# Patient Record
Sex: Female | Born: 1970
Health system: Southern US, Community
[De-identification: ages and names within clinical notes are randomized; demographics above are authoritative.]

## PROBLEM LIST (undated history)

## (undated) DIAGNOSIS — Z0000 Encounter for general adult medical examination without abnormal findings: Secondary | ICD-10-CM

## (undated) DIAGNOSIS — F329 Major depressive disorder, single episode, unspecified: Secondary | ICD-10-CM

## (undated) DIAGNOSIS — B354 Tinea corporis: Secondary | ICD-10-CM

## (undated) DIAGNOSIS — D649 Anemia, unspecified: Secondary | ICD-10-CM

## (undated) DIAGNOSIS — M542 Cervicalgia: Secondary | ICD-10-CM

## (undated) DIAGNOSIS — H6690 Otitis media, unspecified, unspecified ear: Secondary | ICD-10-CM

## (undated) DIAGNOSIS — E785 Hyperlipidemia, unspecified: Secondary | ICD-10-CM

## (undated) DIAGNOSIS — K5900 Constipation, unspecified: Secondary | ICD-10-CM

## (undated) DIAGNOSIS — G9050 Complex regional pain syndrome I, unspecified: Secondary | ICD-10-CM

## (undated) DIAGNOSIS — F17200 Nicotine dependence, unspecified, uncomplicated: Secondary | ICD-10-CM

## (undated) DIAGNOSIS — F32A Depression, unspecified: Secondary | ICD-10-CM

## (undated) DIAGNOSIS — N200 Calculus of kidney: Secondary | ICD-10-CM

## (undated) DIAGNOSIS — R350 Frequency of micturition: Secondary | ICD-10-CM

## (undated) DIAGNOSIS — K219 Gastro-esophageal reflux disease without esophagitis: Secondary | ICD-10-CM

## (undated) DIAGNOSIS — F418 Other specified anxiety disorders: Secondary | ICD-10-CM

## (undated) DIAGNOSIS — E669 Obesity, unspecified: Secondary | ICD-10-CM

## (undated) DIAGNOSIS — B019 Varicella without complication: Secondary | ICD-10-CM

## (undated) HISTORY — DX: Gastro-esophageal reflux disease without esophagitis: K21.9

## (undated) HISTORY — DX: Other specified anxiety disorders: F41.8

## (undated) HISTORY — DX: Obesity, unspecified: E66.9

## (undated) HISTORY — DX: Complex regional pain syndrome I, unspecified: G90.50

## (undated) HISTORY — DX: Constipation, unspecified: K59.00

## (undated) HISTORY — PX: WISDOM TOOTH EXTRACTION: SHX21

## (undated) HISTORY — DX: Tinea corporis: B35.4

## (undated) HISTORY — DX: Otitis media, unspecified, unspecified ear: H66.90

## (undated) HISTORY — DX: Encounter for general adult medical examination without abnormal findings: Z00.00

## (undated) HISTORY — DX: Major depressive disorder, single episode, unspecified: F32.9

## (undated) HISTORY — DX: Cervicalgia: M54.2

## (undated) HISTORY — DX: Varicella without complication: B01.9

## (undated) HISTORY — DX: Nicotine dependence, unspecified, uncomplicated: F17.200

## (undated) HISTORY — DX: Frequency of micturition: R35.0

## (undated) HISTORY — DX: Calculus of kidney: N20.0

## (undated) HISTORY — DX: Hyperlipidemia, unspecified: E78.5

## (undated) HISTORY — DX: Anemia, unspecified: D64.9

## (undated) HISTORY — DX: Depression, unspecified: F32.A

---

## 1998-10-16 LAB — HM PAP SMEAR

## 2003-10-17 HISTORY — PX: OTHER SURGICAL HISTORY: SHX169

## 2007-04-29 ENCOUNTER — Emergency Department (HOSPITAL_COMMUNITY): Admission: EM | Admit: 2007-04-29 | Discharge: 2007-04-30 | Payer: Self-pay | Admitting: Emergency Medicine

## 2011-07-31 LAB — POCT CARDIAC MARKERS
CKMB, poc: 1.4
CKMB, poc: 1.6
Troponin i, poc: <0.05

## 2011-07-31 LAB — BASIC METABOLIC PANEL
BUN: 14
Calcium: 8.6
GFR calc non Af Amer: >60
Potassium: 3.8

## 2011-07-31 LAB — LIPASE, BLOOD: Lipase: 28

## 2013-01-15 ENCOUNTER — Encounter: Payer: Self-pay | Admitting: Physical Medicine & Rehabilitation

## 2013-01-17 ENCOUNTER — Encounter: Payer: Self-pay | Admitting: Physical Medicine & Rehabilitation

## 2013-02-07 ENCOUNTER — Encounter: Payer: Self-pay | Admitting: Physical Medicine & Rehabilitation

## 2013-02-07 ENCOUNTER — Ambulatory Visit (HOSPITAL_BASED_OUTPATIENT_CLINIC_OR_DEPARTMENT_OTHER): Payer: Worker's Compensation | Admitting: Physical Medicine & Rehabilitation

## 2013-02-07 ENCOUNTER — Encounter: Payer: Worker's Compensation | Attending: Physical Medicine & Rehabilitation

## 2013-02-07 VITALS — BP 143/73 | HR 85 | Resp 14 | Ht 67.0 in | Wt 271.0 lb

## 2013-02-07 DIAGNOSIS — G90522 Complex regional pain syndrome I of left lower limb: Secondary | ICD-10-CM

## 2013-02-07 DIAGNOSIS — G90529 Complex regional pain syndrome I of unspecified lower limb: Secondary | ICD-10-CM | POA: Diagnosis not present

## 2013-02-07 NOTE — Patient Instructions (Addendum)
You will get a copy of the IME if you sign the records release

## 2013-02-07 NOTE — Progress Notes (Signed)
Subjective:    Patient ID: Beverly Erickson, female    DOB: Oct 29, 1970, 42 y.o.   MRN: [id number] DOI 06/07/2011 Answering call light for pt, tripped on cord and fell. HPI Records reviewed from [facility] regional physicians. Walk-in medical clinic 06/07/2011. Diagnosis of left knee contusion left ankle sprain. Was given crutches knee and ankle immobilizer ibuprofen and Lortab. 06/10/2011 physician followup visit complained of pain in left foot pain medicines were not helping. Continue Lortab Referral to [facility] orthopedic and sports medicine [facility] initial visit 07/05/2011. Diagnosis was early RSD of the left foot. X-rays of the foot and ankle were performed which showed no fracture or. Head some problems moving her toes on that date. There was foot swelling. Physical therapy was prescribed. MRI of the left ankle ordered by orthopedist 07/06/2011. No evidence of stress fracture there was plantar fasciitis strain the flexor hallucis longus Physical medicine rehabilitation consultation 08/02/2011 complaints of 7/10 pain pins and needles stabbing burning and freezing sensation. No back exam abnormalities. Left lower extremity complex regional pain syndrome was diagnosed. Cortisone tablets tried, not helpfu; Left lumbar sympathetic nerve block performed 08/08/2011. No followup appointment notes included. First nerve block seem to help Second nerve block complained of numbness in the left leg afterwards as well as other symptoms related to menstrual cycles and bowel constipation. Has been on Norco since 2012.  Lidoderm patch. Tried aqua therapy and physical therapy.Tried some local injections around the left ankle. These were not helpful and it felt like it caused swelling. Tried acupuncture and some type of laser treatment as well. Has another prescription for therapy this is pending approval from workers comp. Tens unit helps some Her for household distances. Uses wheelchair for longer  distances. Lyrica causes depression  Have not tried gabapentin or neurontin  Pain Inventory Average Pain 4 Pain Right Now 4 My pain is sharp, burning, dull, tingling and aching  In the last 24 hours, has pain interfered with the following? General activity 8 Relation with others 9 Enjoyment of life 9 What TIME of day is your pain at its worst? daytime evening and night Sleep (in general) Fair  Pain is worse with: walking, bending and standing Pain improves with: rest, therapy/exercise, medication and TENS Relief from Meds: 7  Mobility use a cane use a walker ability to climb steps?  yes do you drive?  yes  Function not employed: date last employed 06/07/11 I need assistance with the following:  bathing, meal prep, household duties and shopping  Neuro/Psych numbness trouble walking dizziness depression  Prior Studies Any changes since last visit?  no  Physicians involved in your care Any changes since last visit?  no   History reviewed. No pertinent family history. History   Social History  . Marital Status: Unknown    Spouse Name: N/A    Number of Children: N/A  . Years of Education: N/A   Social History Main Topics  . Smoking status: None  . Smokeless tobacco: None  . Alcohol Use: None  . Drug Use: None  . Sexually Active: None   Other Topics Concern  . None   Social History Narrative  . None   Past Surgical History  Procedure Laterality Date  . Cesarean section    . Plate and screws in left arm     Past Medical History  Diagnosis Date  . GERD (gastroesophageal reflux disease)    BP 143/73  Pulse 85  Resp 14  Ht 5\' 7"  (1.702 m)  Wt  271 lb (122.925 kg)  BMI 42.43 kg/m2  SpO2 98%    Review of Systems  Constitutional: Positive for unexpected weight change.  Gastrointestinal: Positive for abdominal pain and constipation.  Musculoskeletal: Positive for gait problem.  Neurological: Positive for dizziness and numbness.   Psychiatric/Behavioral: Positive for dysphoric mood.  All other systems reviewed and are negative.       Objective:   Physical Exam  Nursing note and vitals reviewed. Constitutional: She is oriented to person, place, and time. She appears well-developed.  obese  HENT:  Head: Normocephalic and atraumatic.  Eyes: Conjunctivae and EOM are normal. Pupils are equal, round, and reactive to light.  Neck: Normal range of motion.  Musculoskeletal:       Right ankle: Normal.       Left ankle: She exhibits decreased range of motion. Tenderness.       Right foot: Normal.       Left foot: She exhibits tenderness and swelling.  Neurological: She is alert and oriented to person, place, and time. A sensory deficit is present. Gait abnormal.  Temperature measured at the base of the great toe on the left 26F on the right 73F Able to identify light touch on both feet Hypersensitivity to touch on the dorsum of the foot as well as the big toe, plantar surface of the foot is less sensitive. There is edema of the dorsum of the foot and ankle area up to the midcalf. Right foot has normal sensitivity and normal appearance  Psychiatric: Her speech is normal. Her mood appears anxious.    Normal strength in the right Lower ext at the hip flexors knee extensors ankle dorsiflexor plantar flexor Normal strength at the left hip flexor  And knee extensor reduced strength at the ankle dorsiflexor plantar flexor and toe flexors and extensors which are at 3 minus with pain and range of motion limitation related to swelling.      Assessment & Plan:  1. Complex regional pain syndrome left lower extremity. She meets diagnostic criteria with hypersensitivity, swelling, erythema, skin temperature changes. She has had an adequate trial of physical therapy and can be transitioned to a community-based Aquatic exercise program From a medication standpoint she may benefit from a trial of gabapentin or a topical cream that  may contain a nonsteroidal anti-inflammatory, local anesthetic, and neuropathic pain medicine. Given her adverse reaction from previous sympathetic nerve block would not recommend repeat. Spinal cord stimulation is an accepted treatment however based on her prior history she may have difficulty tolerating an invasive procedure. I anticipate that she will have residual disability. That any improvements at this point will be minor. Adaptive shoe can be tried to see if it is tolerated.   As I discussed with the patient and her mother this visit does not establish a doctor-patient relationship. Any of these recommendations will need to be discussed with her physician.  Case manager Mariea Clonts came in during the discussion phase at the request of workers comp, patient was ok with this as well.  Claudette Laws MD FAAPM&R DABPM FIPP

## 2013-02-18 ENCOUNTER — Ambulatory Visit: Payer: Self-pay | Admitting: Physical Medicine & Rehabilitation

## 2013-11-12 ENCOUNTER — Ambulatory Visit: Payer: Worker's Compensation | Admitting: Physician Assistant

## 2013-11-14 ENCOUNTER — Ambulatory Visit (INDEPENDENT_AMBULATORY_CARE_PROVIDER_SITE_OTHER): Payer: BC Managed Care – PPO | Admitting: Physician Assistant

## 2013-11-14 ENCOUNTER — Encounter: Payer: Self-pay | Admitting: Physician Assistant

## 2013-11-14 VITALS — BP 132/88 | HR 88 | Temp 98.4°F | Resp 16 | Ht 67.0 in | Wt 289.0 lb

## 2013-11-14 DIAGNOSIS — G9050 Complex regional pain syndrome I, unspecified: Secondary | ICD-10-CM

## 2013-11-14 DIAGNOSIS — G90529 Complex regional pain syndrome I of unspecified lower limb: Secondary | ICD-10-CM

## 2013-11-14 DIAGNOSIS — IMO0002 Reserved for concepts with insufficient information to code with codable children: Secondary | ICD-10-CM

## 2013-11-14 MED ORDER — AMITRIPTYLINE HCL 10 MG PO TABS: 10.0000 mg | ORAL_TABLET | Freq: Every day | ORAL | Status: DC

## 2013-11-14 MED ORDER — HYDROCODONE-ACETAMINOPHEN 7.5-325 MG PO TABS: 1.0000 | ORAL_TABLET | Freq: Two times a day (BID) | ORAL | Status: DC | PRN

## 2013-11-14 NOTE — Progress Notes (Signed)
Patient presents to clinic today for medication management of Complex Regional Pain Syndrome of the left lower extremity.  Patient has had the diagnosis following an injury to the left foot and ankle she sustained in 2012.  Patient has been evaluated by multiple physicians for this chronic condition.  Patient has pain, hyperesthesias, allodynia, swelling and temperature change on extremities.  Denies change in symptoms from baseline.  Denies numbness of lower extremities.  Denies hx of DVT.  Denies hx of venous insufficiency. Patient has been taking Hydrocodone-APAP for her symptoms.  Has been prescribed from another provider.  After checking the [employer], patient has not had Rx filled in several months.  Patient also states she was tried on both Lyrica and gabapentin in the past with little/no relief of symptoms. Patient denies ever being on TCA for symptoms relief.  Does endorse physical therapy, occupational therapy and hydrotherapy in the past. Patient will be establishing care with Dr. Charlett Blake in the near future.  Past Medical History  Diagnosis Date  . GERD (gastroesophageal reflux disease)   . Anemia   . Chicken pox   . Depression     Current Outpatient Prescriptions on File Prior to Visit  Medication Sig Dispense Refill  . lidocaine (LIDODERM) 5 % Place 1 patch onto the skin daily. Remove & Discard patch within 12 hours or as directed by MD      . Ranitidine HCl (ZANTAC 75 PO) Take 75-150 mg by mouth daily.        No current facility-administered medications on file prior to visit.    Allergies  Allergen Reactions  . Codeine Nausea Only    Family History  Problem Relation Age of Onset  . Arthritis Mother     Living  . Hyperlipidemia Father   . Hypertension Mother   . Hypertension Father   . Thrombocytopenia Mother   . Thrombocytopenia Maternal Grandmother   . Alcohol abuse Maternal Grandfather   . Alcohol abuse Paternal Grandfather   . Cancer Paternal Grandfather   .  Heart disease Maternal Grandfather   . Stroke Maternal Grandmother   . Hypertension Maternal Grandmother   . Diabetes Daughter   . Hypothyroidism Daughter   . Parkinson's disease Maternal Grandfather   . Dementia Paternal Grandmother   . Healthy Brother     x1  . Healthy Son     x1  . Healthy Daughter     x1    History   Social History  . Marital Status: Widowed    Spouse Name: N/A    Number of Children: N/A  . Years of Education: N/A   Social History Main Topics  . Smoking status: Current Every Day Smoker    Types: Cigarettes  . Smokeless tobacco: None  . Alcohol Use: None  . Drug Use: None  . Sexual Activity: None   Other Topics Concern  . None   Social History Narrative  . None   Review of Systems - See HPI.  All other ROS are negative.  Filed Vitals:   11/14/13 0838  BP: 132/88  Pulse: 88  Temp: 98.4 F (36.9 C)  Resp: 16    Physical Exam  Constitutional: She is oriented to person, place, and time.  Cardiovascular: Normal rate, regular rhythm and normal heart sounds.   Pulmonary/Chest: Effort normal and breath sounds normal. No respiratory distress. She has no wheezes. She has no rales. She exhibits no tenderness.  Musculoskeletal:  ROM of left ankle and foot is intact  but exacerbates pain.  Pulses are 2+ equal bilaterally.  Foot is cool to touch, but with normal color.  Good capillary refill noted.  Neurological: She is alert and oriented to person, place, and time. No cranial nerve deficit.  Hyperesthesias and allodynia noted with sensory exam on left ankle and dorsal surface of foot.  Patient is able to discriminate, sharp/dull, soft touch on the plantar surface of her left foot.     No results found for this or any previous visit (from the past 2160 hour(s)).  Assessment/Plan: Complex regional pain syndrome I of lower limb Will refill Hydrocodone-APAP.  Will start with 5 mg Elavil QHS, increasing to 10 mg QHS after 1 week.  Will follow-up in 1  month with myself or to establish with PCP. Patient to consider repeating therapy.   I spent 25 minutes with the patient for interview, exam and treatment.

## 2013-11-14 NOTE — Assessment & Plan Note (Addendum)
Will refill Hydrocodone-APAP.  Will start with 5 mg Elavil QHS, increasing to 10 mg QHS after 1 week.  Will follow-up in 1 month with myself or to establish with PCP. Patient to consider repeating therapy.

## 2013-11-14 NOTE — Patient Instructions (Signed)
Please take medications as prescribed.  Elevate leg while resting.  Start Elavil (amitriptyline) 1/2 tablet at bedtime for 3-4 days.  Then increase to 1 tablet daily.  Please return in 3-4 weeks to see Dr. Charlett Blake for a follow-up.  If she is unavailable, please come see me.  Also, please fill out your portion of the handicap form and return to the [facility]

## 2013-11-17 ENCOUNTER — Telehealth: Payer: Self-pay | Admitting: Family Medicine

## 2013-11-17 NOTE — Telephone Encounter (Signed)
Opened in error

## 2013-11-19 ENCOUNTER — Telehealth: Payer: Self-pay | Admitting: Family Medicine

## 2013-11-19 NOTE — Telephone Encounter (Signed)
Relevant patient education assigned to patient using Emmi. ° °

## 2013-12-03 ENCOUNTER — Ambulatory Visit: Payer: Self-pay | Admitting: Physician Assistant

## 2013-12-04 ENCOUNTER — Ambulatory Visit (INDEPENDENT_AMBULATORY_CARE_PROVIDER_SITE_OTHER): Payer: BC Managed Care – PPO | Admitting: Physician Assistant

## 2013-12-04 ENCOUNTER — Encounter: Payer: Self-pay | Admitting: Physician Assistant

## 2013-12-04 VITALS — BP 124/82 | HR 84 | Temp 97.8°F | Resp 18 | Ht 67.0 in | Wt 290.5 lb

## 2013-12-04 DIAGNOSIS — K219 Gastro-esophageal reflux disease without esophagitis: Secondary | ICD-10-CM

## 2013-12-04 DIAGNOSIS — G9050 Complex regional pain syndrome I, unspecified: Secondary | ICD-10-CM

## 2013-12-04 DIAGNOSIS — R1013 Epigastric pain: Secondary | ICD-10-CM

## 2013-12-04 DIAGNOSIS — G90529 Complex regional pain syndrome I of unspecified lower limb: Secondary | ICD-10-CM

## 2013-12-04 LAB — CBC WITH DIFFERENTIAL/PLATELET
BASOS ABS: 0 10*3/uL (ref 0.0–0.1)
BASOS PCT: 0 % (ref 0–1)
EOS ABS: 0.1 10*3/uL (ref 0.0–0.7)
EOS PCT: 2 % (ref 0–5)
HEMATOCRIT: 42.4 % (ref 36.0–46.0)
Hemoglobin: 14.4 g/dL (ref 12.0–15.0)
Lymphocytes Relative: 31 % (ref 12–46)
Lymphs Abs: 2.2 10*3/uL (ref 0.7–4.0)
MCH: 30.3 pg (ref 26.0–34.0)
MCHC: 34 g/dL (ref 30.0–36.0)
MCV: 89.3 fL (ref 78.0–100.0)
MONO ABS: 0.4 10*3/uL (ref 0.1–1.0)
Monocytes Relative: 6 % (ref 3–12)
Neutro Abs: 4.3 10*3/uL (ref 1.7–7.7)
Neutrophils Relative %: 61 % (ref 43–77)
PLATELETS: 265 10*3/uL (ref 150–400)
RBC: 4.75 MIL/uL (ref 3.87–5.11)
RDW: 13.9 % (ref 11.5–15.5)
WBC: 7.1 10*3/uL (ref 4.0–10.5)

## 2013-12-04 LAB — COMPREHENSIVE METABOLIC PANEL
ALK PHOS: 56 U/L (ref 39–117)
ALT: 17 U/L (ref 0–35)
AST: 19 U/L (ref 0–37)
Albumin: 4 g/dL (ref 3.5–5.2)
BILIRUBIN TOTAL: 0.4 mg/dL (ref 0.2–1.2)
BUN: 15 mg/dL (ref 6–23)
CALCIUM: 9.1 mg/dL (ref 8.4–10.5)
CHLORIDE: 105 meq/L (ref 96–112)
CO2: 26 mEq/L (ref 19–32)
CREATININE: 0.74 mg/dL (ref 0.50–1.10)
Glucose, Bld: 119 mg/dL — ABNORMAL HIGH (ref 70–99)
Potassium: 4.1 mEq/L (ref 3.5–5.3)
Sodium: 137 mEq/L (ref 135–145)
Total Protein: 6.7 g/dL (ref 6.0–8.3)

## 2013-12-04 LAB — LIPASE: Lipase: 23 U/L (ref 0–75)

## 2013-12-04 MED ORDER — OMEPRAZOLE 20 MG PO CPDR: 20.0000 mg | DELAYED_RELEASE_CAPSULE | Freq: Every day | ORAL | Status: DC

## 2013-12-04 MED ORDER — AMITRIPTYLINE HCL 25 MG PO TABS: 25.0000 mg | ORAL_TABLET | Freq: Every day | ORAL | Status: DC

## 2013-12-04 NOTE — Progress Notes (Signed)
Pre visit review using our clinic review tool, if applicable. No additional management support is needed unless otherwise documented below in the visit note/SLS  

## 2013-12-04 NOTE — Progress Notes (Signed)
Patient presents to clinic today for 3-week follow-up of Complex Regional Pain Syndrome.  Patient endorses good pain improvement with addition of Elavil at bedtime.  Medication is also helping with sleep.  Denies new or worsening symptoms associated with diagnosis.  Patient also endorses acid reflux symptoms that are persistent and associated with epigastric pain.  Patient does have history of acid reflux for which she takes an occasional zantac.  Patient denies nausea, vomiting, diarrhea.  Denies fever, chills, fatigue.  Denies recent alcohol consumption.  Denies hx of pancreatitis, H. Pylori infection or gallstone.  Patient denies abdominal pain associated with meals.  Denies anorexia.  Past Medical History  Diagnosis Date  . GERD (gastroesophageal reflux disease)   . Anemia   . Chicken pox   . Depression     Current Outpatient Prescriptions on File Prior to Visit  Medication Sig Dispense Refill  . HYDROcodone-acetaminophen (NORCO) 7.5-325 MG per tablet Take 1 tablet by mouth 2 (two) times daily as needed.  60 tablet  0  . lidocaine (LIDODERM) 5 % Place 1 patch onto the skin daily. Remove & Discard patch within 12 hours or as directed by MD       No current facility-administered medications on file prior to visit.    Allergies  Allergen Reactions  . Codeine Nausea Only    Family History  Problem Relation Age of Onset  . Arthritis Mother     Living  . Hyperlipidemia Father   . Hypertension Mother   . Hypertension Father   . Thrombocytopenia Mother   . Thrombocytopenia Maternal Grandmother   . Alcohol abuse Maternal Grandfather   . Alcohol abuse Paternal Grandfather   . Cancer Paternal Grandfather   . Heart disease Maternal Grandfather   . Stroke Maternal Grandmother   . Hypertension Maternal Grandmother   . Diabetes Daughter   . Hypothyroidism Daughter   . Parkinson's disease Maternal Grandfather   . Dementia Paternal Grandmother   . Healthy Brother     x1  . Healthy  Son     x1  . Healthy Daughter     x1    History   Social History  . Marital Status: Widowed    Spouse Name: N/A    Number of Children: N/A  . Years of Education: N/A   Social History Main Topics  . Smoking status: Current Every Day Smoker    Types: Cigarettes  . Smokeless tobacco: None  . Alcohol Use: None  . Drug Use: None  . Sexual Activity: None   Other Topics Concern  . None   Social History Narrative  . None   Review of Systems - See HPI.  All other ROS are negative.  BP 124/82  Pulse 84  Temp(Src) 97.8 F (36.6 C) (Oral)  Resp 18  Ht '5\' 7"'  (1.702 m)  Wt 290 lb 8 oz (131.77 kg)  BMI 45.49 kg/m2  SpO2 98%  Physical Exam  Vitals reviewed. Constitutional: She is oriented to person, place, and time.  Obese caucasian female in no acute distress  HENT:  Head: Normocephalic and atraumatic.  Eyes: Conjunctivae are normal.  Neck: Neck supple.  Cardiovascular: Normal rate, regular rhythm and normal heart sounds.   Pulmonary/Chest: Effort normal and breath sounds normal. No respiratory distress.  Abdominal: Soft. Bowel sounds are normal. She exhibits no distension and no mass. There is no rebound and no guarding.  + epigastric tenderness to palpation.  Mild RUQ tenderness with negative Murphy sign.  Lymphadenopathy:  She has no cervical adenopathy.  Neurological: She is alert and oriented to person, place, and time.  Skin: Skin is dry.  Coolness and purplish skin changes of LLE unchanged from previous examination, consistent with patient's diagnosis of CRPS.  Pulses 2+ equal.  Psychiatric: Affect normal.    Recent Results (from the past 2160 hour(s))  CBC WITH DIFFERENTIAL     Status: None   Collection Time    12/04/13  2:30 PM      Result Value Ref Range   WBC 7.1  4.0 - 10.5 K/uL   RBC 4.75  3.87 - 5.11 MIL/uL   Hemoglobin 14.4  12.0 - 15.0 g/dL   HCT 42.4  36.0 - 46.0 %   MCV 89.3  78.0 - 100.0 fL   MCH 30.3  26.0 - 34.0 pg   MCHC 34.0  30.0 -  36.0 g/dL   RDW 13.9  11.5 - 15.5 %   Platelets 265  150 - 400 K/uL   Neutrophils Relative % 61  43 - 77 %   Neutro Abs 4.3  1.7 - 7.7 K/uL   Lymphocytes Relative 31  12 - 46 %   Lymphs Abs 2.2  0.7 - 4.0 K/uL   Monocytes Relative 6  3 - 12 %   Monocytes Absolute 0.4  0.1 - 1.0 K/uL   Eosinophils Relative 2  0 - 5 %   Eosinophils Absolute 0.1  0.0 - 0.7 K/uL   Basophils Relative 0  0 - 1 %   Basophils Absolute 0.0  0.0 - 0.1 K/uL   Smear Review Criteria for review not met    COMPREHENSIVE METABOLIC PANEL     Status: Abnormal   Collection Time    12/04/13  2:30 PM      Result Value Ref Range   Sodium 137  135 - 145 mEq/L   Potassium 4.1  3.5 - 5.3 mEq/L   Chloride 105  96 - 112 mEq/L   CO2 26  19 - 32 mEq/L   Glucose, Bld 119 (*) 70 - 99 mg/dL   BUN 15  6 - 23 mg/dL   Creat 0.74  0.50 - 1.10 mg/dL   Total Bilirubin 0.4  0.2 - 1.2 mg/dL   Comment: ** Please note change in reference range(s). **   Alkaline Phosphatase 56  39 - 117 U/L   AST 19  0 - 37 U/L   ALT 17  0 - 35 U/L   Total Protein 6.7  6.0 - 8.3 g/dL   Albumin 4.0  3.5 - 5.2 g/dL   Calcium 9.1  8.4 - 10.5 mg/dL  LIPASE     Status: None   Collection Time    12/04/13  2:30 PM      Result Value Ref Range   Lipase 23  0 - 75 U/L  H. PYLORI ANTIBODY, IGG     Status: None   Collection Time    12/04/13  2:30 PM      Result Value Ref Range   H Pylori IgG        Assessment/Plan: GERD (gastroesophageal reflux disease) Zantac not sufficient.  Rx Prilosex daily.  Will attempt 2 week trial of medication.  Avoid late-night eating, alcohol, chocolate, spicy foods.  Elevate HOB and take two TUMS at bedtime.  If symptoms persist, we will need to send her to GI for endoscopy.  Complex regional pain syndrome I of lower limb Pain improved with addition of Amitriptyline.  Will increase dose to 25 mg QHS.  Follow-up with Dr. Charlett Blake at "New Patient" appointment.  Will refill narcotic pain medicine when needed.    Abdominal pain,  epigastric Seems related to uncontrolled GERD. However, there is TTP of epigastrium on examination.  Will Rx Prilosec daily.  Will obtain CBC, CMP, lipase, H. Pylori.  Discusses ultrasound with patient.  Patient declines at present.  Will attempt PPI and dietary changes first.  Patient educated on alarm signs/symptoms and when to proceed to the ER.

## 2013-12-04 NOTE — Patient Instructions (Signed)
Increase fluid intake.  Rest.  Take a fiber supplement. Take Prilosec daily.  Avoid late night eating.  Please obtain labs.  I will call you with your results.  Please call insurance about Ultrasound.  If symptoms are not improving, we will need to proceed with imaging.  For Complex Regional Pain Syndrome -- Take new prescription for 25 mg Elavil at bedtime.  Continue hydrocodone as needed.

## 2013-12-05 DIAGNOSIS — R1013 Epigastric pain: Secondary | ICD-10-CM | POA: Insufficient documentation

## 2013-12-05 DIAGNOSIS — K219 Gastro-esophageal reflux disease without esophagitis: Secondary | ICD-10-CM | POA: Insufficient documentation

## 2013-12-05 LAB — H. PYLORI ANTIBODY, IGG: H Pylori IgG: 0.49 {ISR}

## 2013-12-05 NOTE — Assessment & Plan Note (Signed)
Seems related to uncontrolled GERD. However, there is TTP of epigastrium on examination.  Will Rx Prilosec daily.  Will obtain CBC, CMP, lipase, H. Pylori.  Discusses ultrasound with patient.  Patient declines at present.  Will attempt PPI and dietary changes first.  Patient educated on alarm signs/symptoms and when to proceed to the ER.

## 2013-12-05 NOTE — Assessment & Plan Note (Signed)
Zantac not sufficient.  Rx Prilosex daily.  Will attempt 2 week trial of medication.  Avoid late-night eating, alcohol, chocolate, spicy foods.  Elevate HOB and take two TUMS at bedtime.  If symptoms persist, we will need to send her to GI for endoscopy.

## 2013-12-05 NOTE — Assessment & Plan Note (Signed)
Pain improved with addition of Amitriptyline.  Will increase dose to 25 mg QHS.  Follow-up with Dr. Charlett Blake at "New Patient" appointment.  Will refill narcotic pain medicine when needed.

## 2014-01-13 ENCOUNTER — Telehealth: Payer: Self-pay | Admitting: Family Medicine

## 2014-01-13 ENCOUNTER — Other Ambulatory Visit: Payer: Self-pay

## 2014-01-13 ENCOUNTER — Ambulatory Visit (INDEPENDENT_AMBULATORY_CARE_PROVIDER_SITE_OTHER): Payer: BC Managed Care – PPO | Admitting: Family Medicine

## 2014-01-13 ENCOUNTER — Encounter: Payer: Self-pay | Admitting: Family Medicine

## 2014-01-13 VITALS — BP 128/72 | HR 90 | Temp 98.1°F | Ht 67.0 in | Wt 294.0 lb

## 2014-01-13 DIAGNOSIS — G8929 Other chronic pain: Secondary | ICD-10-CM

## 2014-01-13 DIAGNOSIS — G9050 Complex regional pain syndrome I, unspecified: Secondary | ICD-10-CM

## 2014-01-13 DIAGNOSIS — E785 Hyperlipidemia, unspecified: Secondary | ICD-10-CM

## 2014-01-13 DIAGNOSIS — Z0000 Encounter for general adult medical examination without abnormal findings: Secondary | ICD-10-CM

## 2014-01-13 DIAGNOSIS — R7309 Other abnormal glucose: Secondary | ICD-10-CM

## 2014-01-13 DIAGNOSIS — E669 Obesity, unspecified: Secondary | ICD-10-CM

## 2014-01-13 DIAGNOSIS — R1013 Epigastric pain: Secondary | ICD-10-CM

## 2014-01-13 DIAGNOSIS — F17200 Nicotine dependence, unspecified, uncomplicated: Secondary | ICD-10-CM

## 2014-01-13 DIAGNOSIS — K219 Gastro-esophageal reflux disease without esophagitis: Secondary | ICD-10-CM

## 2014-01-13 DIAGNOSIS — Z1231 Encounter for screening mammogram for malignant neoplasm of breast: Secondary | ICD-10-CM

## 2014-01-13 DIAGNOSIS — R739 Hyperglycemia, unspecified: Secondary | ICD-10-CM

## 2014-01-13 DIAGNOSIS — IMO0002 Reserved for concepts with insufficient information to code with codable children: Secondary | ICD-10-CM

## 2014-01-13 LAB — LIPID PANEL
CHOL/HDL RATIO: 4.1 ratio
CHOLESTEROL: 165 mg/dL (ref 0–200)
HDL: 40 mg/dL (ref >39)
LDL Cholesterol: 92 mg/dL (ref 0–99)
TRIGLYCERIDES: 164 mg/dL — AB (ref <150)
VLDL: 33 mg/dL (ref 0–40)

## 2014-01-13 LAB — RENAL FUNCTION PANEL
Albumin: 3.9 g/dL (ref 3.5–5.2)
BUN: 13 mg/dL (ref 6–23)
CALCIUM: 9 mg/dL (ref 8.4–10.5)
CO2: 26 mEq/L (ref 19–32)
Chloride: 108 mEq/L (ref 96–112)
Creat: 0.74 mg/dL (ref 0.50–1.10)
GLUCOSE: 84 mg/dL (ref 70–99)
POTASSIUM: 4.2 meq/L (ref 3.5–5.3)
Phosphorus: 3.1 mg/dL (ref 2.3–4.6)
Sodium: 141 mEq/L (ref 135–145)

## 2014-01-13 LAB — TSH: TSH: 2.308 u[IU]/mL (ref 0.350–4.500)

## 2014-01-13 LAB — HEMOGLOBIN A1C
Hgb A1c MFr Bld: 5.4 % (ref <5.7)
MEAN PLASMA GLUCOSE: 108 mg/dL (ref <117)

## 2014-01-13 MED ORDER — OMEPRAZOLE 40 MG PO CPDR: 40.0000 mg | DELAYED_RELEASE_CAPSULE | Freq: Every day | ORAL | Status: DC

## 2014-01-13 MED ORDER — HYDROCODONE-ACETAMINOPHEN 7.5-325 MG PO TABS: 1.0000 | ORAL_TABLET | Freq: Two times a day (BID) | ORAL | Status: DC | PRN

## 2014-01-13 NOTE — Telephone Encounter (Signed)
Received medical records from [facility] °

## 2014-01-13 NOTE — Patient Instructions (Signed)
  probiotics  DASH Diet The DASH diet stands for "Dietary Approaches to Stop Hypertension." It is a healthy eating plan that has been shown to reduce high blood pressure (hypertension) in as little as 14 days, while also possibly providing other significant health benefits. These other health benefits include reducing the risk of breast cancer after menopause and reducing the risk of type 2 diabetes, heart disease, colon cancer, and stroke. Health benefits also include weight loss and slowing kidney failure in patients with chronic kidney disease.  DIET GUIDELINES  Limit salt (sodium). Your diet should contain less than 1500 mg of sodium daily.  Limit refined or processed carbohydrates. Your diet should include mostly whole grains. Desserts and added sugars should be used sparingly.  Include small amounts of heart-healthy fats. These types of fats include nuts, oils, and tub margarine. Limit saturated and trans fats. These fats have been shown to be harmful in the body. CHOOSING FOODS  The following food groups are based on a 2000 calorie diet. See your Registered Dietitian for individual calorie needs. Grains and Grain Products (6 to 8 servings daily)  Eat More Often: Whole-wheat bread, brown rice, whole-grain or wheat pasta, quinoa, popcorn without added fat or salt (air popped).  Eat Less Often: White bread, white pasta, white rice, cornbread. Vegetables (4 to 5 servings daily)  Eat More Often: Fresh, frozen, and canned vegetables. Vegetables may be raw, steamed, roasted, or grilled with a minimal amount of fat.  Eat Less Often/Avoid: Creamed or fried vegetables. Vegetables in a cheese sauce. Fruit (4 to 5 servings daily)  Eat More Often: All fresh, canned (in natural juice), or frozen fruits. Dried fruits without added sugar. One hundred percent fruit juice ( cup [237 mL] daily).  Eat Less Often: Dried fruits with added sugar. Canned fruit in light or heavy syrup. [employer], Fish,  and Poultry (2 servings or less daily. One serving is 3 to 4 oz [85-114 g]).  Eat More Often: Ninety percent or leaner ground beef, tenderloin, sirloin. Round cuts of beef, chicken breast, [place] breast. All fish. Grill, bake, or broil your meat. Nothing should be fried.  Eat Less Often/Avoid: Fatty cuts of meat, [place], or chicken leg, thigh, or wing. Fried cuts of meat or fish. Dairy (2 to 3 servings)  Eat More Often: Low-fat or fat-free milk, low-fat plain or light yogurt, reduced-fat or part-skim cheese.  Eat Less Often/Avoid: Milk (whole, 2%).Whole milk yogurt. Full-fat cheeses. Nuts, Seeds, and Legumes (4 to 5 servings per week)  Eat More Often: All without added salt.  Eat Less Often/Avoid: Salted nuts and seeds, canned beans with added salt. Fats and Sweets (limited)  Eat More Often: Vegetable oils, tub margarines without trans fats, sugar-free gelatin. Mayonnaise and salad dressings.  Eat Less Often/Avoid: Coconut oils, palm oils, butter, stick margarine, cream, half and half, cookies, candy, pie. FOR MORE INFORMATION The Dash Diet Eating Plan: www.dashdiet.org Document Released: 09/21/2011 Document Revised: 12/25/2011 Document Reviewed: 09/21/2011 [facility] Patient Information 2014 [place], [place].

## 2014-01-13 NOTE — Progress Notes (Signed)
Pre visit review using our clinic review tool, if applicable. No additional management support is needed unless otherwise documented below in the visit note. 

## 2014-01-14 ENCOUNTER — Telehealth: Payer: Self-pay | Admitting: Family Medicine

## 2014-01-14 ENCOUNTER — Ambulatory Visit (HOSPITAL_BASED_OUTPATIENT_CLINIC_OR_DEPARTMENT_OTHER)
Admission: RE | Admit: 2014-01-14 | Discharge: 2014-01-14 | Disposition: A | Discharge status: Home / Self Care | Payer: BC Managed Care – PPO | Source: Ambulatory Visit | Attending: Family Medicine | Admitting: Family Medicine

## 2014-01-14 ENCOUNTER — Encounter: Payer: Self-pay | Admitting: Family Medicine

## 2014-01-14 DIAGNOSIS — R739 Hyperglycemia, unspecified: Secondary | ICD-10-CM | POA: Insufficient documentation

## 2014-01-14 DIAGNOSIS — K219 Gastro-esophageal reflux disease without esophagitis: Secondary | ICD-10-CM | POA: Insufficient documentation

## 2014-01-14 DIAGNOSIS — F17200 Nicotine dependence, unspecified, uncomplicated: Secondary | ICD-10-CM

## 2014-01-14 DIAGNOSIS — E782 Mixed hyperlipidemia: Secondary | ICD-10-CM | POA: Insufficient documentation

## 2014-01-14 DIAGNOSIS — E785 Hyperlipidemia, unspecified: Secondary | ICD-10-CM

## 2014-01-14 DIAGNOSIS — R1013 Epigastric pain: Secondary | ICD-10-CM | POA: Insufficient documentation

## 2014-01-14 DIAGNOSIS — G8929 Other chronic pain: Secondary | ICD-10-CM

## 2014-01-14 HISTORY — DX: Hyperlipidemia, unspecified: E78.5

## 2014-01-14 HISTORY — DX: Nicotine dependence, unspecified, uncomplicated: F17.200

## 2014-01-14 NOTE — Telephone Encounter (Signed)
Patient requesting call back with lab results.

## 2014-01-14 NOTE — Assessment & Plan Note (Signed)
hgba1c acceptable, minimize simple carbs. Increase exercise as tolerated.  

## 2014-01-14 NOTE — Assessment & Plan Note (Signed)
Encouraged complete cessation. Discussed need to quit as relates to risk of numerous cancers, cardiac and pulmonary disease as well as neurologic complications. Counseled for greater than 3 minutes 

## 2014-01-14 NOTE — Assessment & Plan Note (Signed)
Mild. Encouraged heart healthy diet, increase exercise, avoid trans fats, consider a krill oil cap daily 

## 2014-01-14 NOTE — Assessment & Plan Note (Signed)
Avoid offending foods, start probiotics. Do not eat large meals in late evening and consider raising head of bed. Omeprazole will be increased to 40 mg

## 2014-01-14 NOTE — Assessment & Plan Note (Signed)
Encouraged DASH diet, decrease po intake and increase exercise as tolerated. Needs 7-8 hours of sleep nightly. Avoid trans fats, eat small, frequent meals every 4-5 hours with lean proteins, complex carbs and healthy fats. Minimize simple carbs, GMO foods. 

## 2014-01-14 NOTE — Progress Notes (Signed)
Patient ID: Beverly Erickson, female   DOB: [birth year], 43 y.o.   MRN: [id number] Beverly Erickson [id number] Oct 08, 1971 01/14/2014      Progress Note-Follow Up  Subjective  Chief Complaint  Chief Complaint  Patient presents with  . Establish Care    new patient    HPI  Patient is a 43 year old female in today for routine medical care. Also a patient of our practice. She has had several years of pain in her left leg status post a work-related injury. She has developed Loma Sousa to manage this. Unfortunately she smokes one pack per day. Her newest complaint is of increased abdominal pain she complains of epigastric pain dyspepsia and belching. She notes that omeprazole 20 mg has been partially helpful but not completely full. No other new complaints. Denies CP/palp/SOB/HA/congestion/fevers/GI or GU c/o. Taking meds as prescribed  Past Medical History  Diagnosis Date  . GERD (gastroesophageal reflux disease)   . Anemia   . Chicken pox   . Depression   . Obesity   . RSD (reflex sympathetic dystrophy)   . Tobacco use disorder 01/14/2014    1ppd  . Other and unspecified hyperlipidemia 01/14/2014    Past Surgical History  Procedure Laterality Date  . Cesarean section  2000  . Plate and screws in left arm  2005    humerus  . Wisdom tooth extraction  43 yrs old    Family History  Problem Relation Age of Onset  . Arthritis Mother     Living  . Hypertension Mother   . Thrombocytopenia Mother   . Hyperlipidemia Father   . Hypertension Father   . Thrombocytopenia Maternal Grandmother   . Stroke Maternal Grandmother   . Hypertension Maternal Grandmother   . Alcohol abuse Maternal Grandfather   . Heart disease Maternal Grandfather   . Parkinson's disease Maternal Grandfather   . Alcohol abuse Paternal Grandfather   . Cancer Paternal Grandfather   . Hypothyroidism Daughter   . Diabetes Daughter     type 1  . Dementia Paternal Grandmother     History   Social History  . Marital  Status: Widowed    Spouse Name: N/A    Number of Children: N/A  . Years of Education: N/A   Occupational History  . Not on file.   Social History Main Topics  . Smoking status: Current Every Day Smoker    Types: Cigarettes  . Smokeless tobacco: Not on file  . Alcohol Use: Yes     Comment: once a year.  . Drug Use: No  . Sexual Activity: Not on file     Comment: lives with son, 23 yo daughter, boyfriend, no dietary   Other Topics Concern  . Not on file   Social History Narrative  . No narrative on file    Current Outpatient Prescriptions on File Prior to Visit  Medication Sig Dispense Refill  . amitriptyline (ELAVIL) 25 MG tablet Take 1 tablet (25 mg total) by mouth at bedtime.  30 tablet  1  . lidocaine (LIDODERM) 5 % Place 1 patch onto the skin daily. Remove & Discard patch within 12 hours or as directed by MD       No current facility-administered medications on file prior to visit.    Allergies  Allergen Reactions  . Codeine Nausea Only    Review of Systems  Review of Systems  Constitutional: Negative for fever, chills and malaise/fatigue.  HENT: Negative for congestion, hearing loss and nosebleeds.  Eyes: Negative for discharge.  Respiratory: Negative for cough, sputum production, shortness of breath and wheezing.   Cardiovascular: Negative for chest pain, palpitations and leg swelling.  Gastrointestinal: Positive for heartburn and abdominal pain. Negative for nausea, vomiting, diarrhea and constipation.  Genitourinary: Negative for dysuria, urgency, frequency and hematuria.  Musculoskeletal: Positive for joint pain. Negative for back pain, falls and myalgias.       Left leg pain, knee to foot is the worst  Skin: Negative for rash.  Neurological: Negative for dizziness, tremors, sensory change, focal weakness, loss of consciousness, weakness and headaches.  Endo/Heme/Allergies: Negative for polydipsia. Does not bruise/bleed easily.  Psychiatric/Behavioral:  Negative for depression and suicidal ideas. The patient is not nervous/anxious and does not have insomnia.     Objective  BP 128/72  Pulse 90  Temp(Src) 98.1 F (36.7 C) (Oral)  Ht 5\' 7"  (1.702 m)  Wt 294 lb 0.6 oz (133.376 kg)  BMI 46.04 kg/m2  SpO2 97%  Physical Exam  Physical Exam  Constitutional: She is oriented to person, place, and time and well-developed, well-nourished, and in no distress. No distress.  HENT:  Head: Normocephalic and atraumatic.  Right Ear: External ear normal.  Left Ear: External ear normal.  Nose: Nose normal.  Mouth/Throat: Oropharynx is clear and moist. No oropharyngeal exudate.  Eyes: Conjunctivae are normal. Pupils are equal, round, and reactive to light. Right eye exhibits no discharge. Left eye exhibits no discharge. No scleral icterus.  Neck: Normal range of motion. Neck supple. No thyromegaly present.  Cardiovascular: Normal rate, regular rhythm, normal heart sounds and intact distal pulses.   No murmur heard. Pulmonary/Chest: Effort normal and breath sounds normal. No respiratory distress. She has no wheezes. She has no rales.  Abdominal: Soft. Bowel sounds are normal. She exhibits no distension and no mass. There is no tenderness.  Musculoskeletal: Normal range of motion. She exhibits no edema and no tenderness.  Lymphadenopathy:    She has no cervical adenopathy.  Neurological: She is alert and oriented to person, place, and time. She has normal reflexes. No cranial nerve deficit. Coordination normal.  Skin: Skin is warm and dry. No rash noted. She is not diaphoretic.  Psychiatric: Mood, memory and affect normal.    Lab Results  Component Value Date   TSH 2.308 01/13/2014   Lab Results  Component Value Date   WBC 7.1 12/04/2013   HGB 14.4 12/04/2013   HCT 42.4 12/04/2013   MCV 89.3 12/04/2013   PLT 265 12/04/2013   Lab Results  Component Value Date   CREATININE 0.74 01/13/2014   BUN 13 01/13/2014   NA 141 01/13/2014   K 4.2 01/13/2014    CL 108 01/13/2014   CO2 26 01/13/2014   Lab Results  Component Value Date   ALT 17 12/04/2013   AST 19 12/04/2013   ALKPHOS 56 12/04/2013   BILITOT 0.4 12/04/2013   Lab Results  Component Value Date   CHOL 165 01/13/2014   Lab Results  Component Value Date   HDL 40 01/13/2014   Lab Results  Component Value Date   LDLCALC 92 01/13/2014   Lab Results  Component Value Date   TRIG 164* 01/13/2014   Lab Results  Component Value Date   CHOLHDL 4.1 01/13/2014     Assessment & Plan  Tobacco use disorder Encouraged complete cessation. Discussed need to quit as relates to risk of numerous cancers, cardiac and pulmonary disease as well as neurologic complications. Counseled for greater than 3  minutes  GERD (gastroesophageal reflux disease) Avoid offending foods, start probiotics. Do not eat large meals in late evening and consider raising head of bed. Omeprazole will be increased to 40 mg  Obesity Encouraged DASH diet, decrease po intake and increase exercise as tolerated. Needs 7-8 hours of sleep nightly. Avoid trans fats, eat small, frequent meals every 4-5 hours with lean proteins, complex carbs and healthy fats. Minimize simple carbs, GMO foods.  Other and unspecified hyperlipidemia Mild. Encouraged heart healthy diet, increase exercise, avoid trans fats, consider a krill oil cap daily  Hyperglycemia hgba1c acceptable, minimize simple carbs. Increase exercise as tolerated.

## 2014-01-15 NOTE — Telephone Encounter (Signed)
Informed patient of this.  °

## 2014-01-15 NOTE — Telephone Encounter (Signed)
Please inform pt that these were mailed to her

## 2014-01-17 LAB — HM PAP SMEAR

## 2014-02-05 ENCOUNTER — Ambulatory Visit: Payer: Self-pay

## 2014-02-13 ENCOUNTER — Telehealth: Payer: Self-pay | Admitting: Family Medicine

## 2014-02-13 ENCOUNTER — Ambulatory Visit (INDEPENDENT_AMBULATORY_CARE_PROVIDER_SITE_OTHER): Payer: BC Managed Care – PPO | Admitting: Family Medicine

## 2014-02-13 ENCOUNTER — Encounter: Payer: Self-pay | Admitting: Family Medicine

## 2014-02-13 VITALS — BP 122/84 | HR 85 | Temp 98.6°F | Ht 67.0 in | Wt 292.1 lb

## 2014-02-13 DIAGNOSIS — IMO0002 Reserved for concepts with insufficient information to code with codable children: Secondary | ICD-10-CM

## 2014-02-13 DIAGNOSIS — G9050 Complex regional pain syndrome I, unspecified: Secondary | ICD-10-CM

## 2014-02-13 DIAGNOSIS — M546 Pain in thoracic spine: Secondary | ICD-10-CM

## 2014-02-13 DIAGNOSIS — N200 Calculus of kidney: Secondary | ICD-10-CM

## 2014-02-13 DIAGNOSIS — F17200 Nicotine dependence, unspecified, uncomplicated: Secondary | ICD-10-CM

## 2014-02-13 DIAGNOSIS — G2581 Restless legs syndrome: Secondary | ICD-10-CM

## 2014-02-13 DIAGNOSIS — E785 Hyperlipidemia, unspecified: Secondary | ICD-10-CM

## 2014-02-13 DIAGNOSIS — M549 Dorsalgia, unspecified: Secondary | ICD-10-CM

## 2014-02-13 HISTORY — DX: Calculus of kidney: N20.0

## 2014-02-13 MED ORDER — LIDOCAINE 5 % EX PTCH: 1.0000 | MEDICATED_PATCH | CUTANEOUS | Status: DC

## 2014-02-13 MED ORDER — CARISOPRODOL 350 MG PO TABS
350.0000 mg | ORAL_TABLET | Freq: Four times a day (QID) | ORAL | Status: DC | PRN
Start: 2014-02-13 — End: 2015-11-30

## 2014-02-13 MED ORDER — PRAMIPEXOLE DIHYDROCHLORIDE 0.25 MG PO TABS: 0.2500 mg | ORAL_TABLET | Freq: Every evening | ORAL | Status: DC | PRN

## 2014-02-13 MED ORDER — HYDROCODONE-ACETAMINOPHEN 7.5-325 MG PO TABS: 1.0000 | ORAL_TABLET | Freq: Two times a day (BID) | ORAL | Status: DC | PRN

## 2014-02-13 NOTE — Telephone Encounter (Signed)
Received paperwork for Lidocaine PA, forward to nurse

## 2014-02-13 NOTE — Progress Notes (Signed)
Pre visit review using our clinic review tool, if applicable. No additional management support is needed unless otherwise documented below in the visit note. 

## 2014-02-13 NOTE — Patient Instructions (Signed)
Complex Regional Pain Syndrome Complex Regional Pain Syndrome (CRPS) is a nerve disorder that occurs at the site of an injury. It occurs especially after injuries from high-velocity impacts such as those from bullets or shrapnel. However, it may occur without apparent injury. The arms or legs are usually involved. SYMPTOMS  CRPS is a chronic condition characterized by:  Severe burning pain.  Changes in bone and skin.  Excessive sweating.  Tissue swelling.  Extreme sensitivity to touch. One visible sign of CRPS near the site of injury is warm, shiny, red skin that later becomes cool and bluish. The pain that patients report is out of proportion to the severity of the injury. The pain gets worse, rather than better, over time. Eventually the joints become stiff from disuse and the skin, muscles, and bone atrophy. The symptoms of CRPS vary in severity and duration. The cause of CRPS is unknown. The disorder is unique in that it simultaneously affects the:  Nerves.  Skin.  Muscles.  Blood vessels.  Bones. CRPS can strike at any age but is more common between the ages of 21 and 59. However, the number of CRPS cases among adolescents and young adults is increasing. CRPS is diagnosed primarily through observation of the symptoms. Some physicians use thermography to detect changes in body temperature that are common in CRPS. X-rays may also show changes in the bone.  TREATMENT  Treatments include:  Physicians use a variety of drugs to treat CRPS.  Elevation of the extremity and physical therapy are also used to treat CRPS.  Injection of a local anesthetic.  Applying brief pulses of electricity to nerve endings under the skin (transcutaneous electrical stimulation or TENS).  In some cases, interruption of the affected portion of the sympathetic nervous system (surgical or chemical sympathectomy) is necessary to relieve pain. This involves cutting the nerve or nerves. Pain is destroyed  almost instantly, but this treatment may also destroy other sensations. PROGNOSIS Good progress can be made in treating CRPS if treatment is begun early. Ideally treatment should begin within 3 months of the first symptoms. Early treatment often results in remission. If treatment is delayed, the disorder can quickly spread to the entire limb, and changes in bone and muscle may become irreversible. In 50 percent of CRPS cases, pain persists longer than 6 months and sometimes for years. Document Released: 09/22/2002 Document Revised: 12/25/2011 Document Reviewed: 08/12/2008 [facility] Patient Information 2014 [place], [place]. Restless Legs Syndrome Restless legs syndrome is a movement disorder. It may also be called a sensori-motor disorder.  CAUSES  No one knows what specifically causes restless legs syndrome, but it tends to run in families. It is also more common in people with low iron, in pregnancy, in people who need dialysis, and those with nerve damage (neuropathy).Some medications may make restless legs syndrome worse.Those medications include drugs to treat high blood pressure, some heart conditions, nausea, colds, allergies, and depression. SYMPTOMS Symptoms include uncomfortable sensations in the legs. These leg sensations are worse during periods of inactivity or rest. They are also worse while sitting or lying down. Individuals that have the disorder describe sensations in the legs that feel like:  Pulling.  Drawing.  Crawling.  Worming.  Boring.  Tingling.  Pins and needles.  Prickling.  Pain. The sensations are usually accompanied by an overwhelming urge to move the legs. Sudden muscle jerks may also occur. Movement provides temporary relief from the discomfort. In rare cases, the arms may also be affected. Symptoms may interfere with  going to sleep (sleep onset insomnia). Restless legs syndrome may also be related to periodic limb movement disorder (PLMD). PLMD is another  more common motor disorder. It also causes interrupted sleep. The symptoms from PLMD usually occur most often when you are awake. TREATMENT  Treatment for restless legs syndrome is symptomatic. This means that the symptoms are treated.   Massage and cold compresses may provide temporary relief.  Walk, stretch, or take a cold or hot bath.  Get regular exercise and a good night's sleep.  Avoid caffeine, alcohol, nicotine, and medications that can make it worse.  Do activities that provide mental stimulation like discussions, needlework, and video games. These may be helpful if you are not able to walk or stretch. Some medications are effective in relieving the symptoms. However, many of these medications have side effects. Ask your caregiver about medications that may help your symptoms. Correcting iron deficiency may improve symptoms for some patients. Document Released: 09/22/2002 Document Revised: 12/25/2011 Document Reviewed: 12/29/2010 [facility] Patient Information 2014 [place].

## 2014-02-15 ENCOUNTER — Encounter: Payer: Self-pay | Admitting: Family Medicine

## 2014-02-15 NOTE — Assessment & Plan Note (Signed)
Encouraged heart healthy diet, increase exercise, avoid trans fats, consider a krill oil cap daily 

## 2014-02-15 NOTE — Assessment & Plan Note (Signed)
Encouraged complete cessation. Discussed need to quit as relates to risk of numerous cancers, cardiac and pulmonary disease as well as neurologic complications. Counseled for greater than 3 minutes 

## 2014-02-15 NOTE — Progress Notes (Signed)
Patient ID: Beverly Erickson, female   DOB: 1971/04/14, 43 y.o.   MRN: [id number] Beverly Erickson [id number] March 16, 1971 02/15/2014      Progress Note-Follow Up  Subjective  Chief Complaint  Chief Complaint  Patient presents with  . Follow-up    4 week    HPI  Patient is a 43 year old female in today for routine medical care. She is generally doing well. No recent illness. She continues to struggle with some left lower extremity pain from her reflex sympathetic dystrophy. her insomnia is mildly responsive to the Elavil but this does not keep her asleep. Well controlled, no changes to meds. Encouraged heart healthy diet such as the DASH diet and exercise as tolerated.   Past Medical History  Diagnosis Date  . GERD (gastroesophageal reflux disease)   . Anemia   . Chicken pox   . Depression   . Obesity   . RSD (reflex sympathetic dystrophy)   . Tobacco use disorder 01/14/2014    1ppd  . Other and unspecified hyperlipidemia 01/14/2014  . Kidney stone 02/13/2014    Right, small  . Mid back pain on right side 02/13/2014    Past Surgical History  Procedure Laterality Date  . Cesarean section  2000  . Plate and screws in left arm  2005    humerus  . Wisdom tooth extraction  43 yrs old    Family History  Problem Relation Age of Onset  . Arthritis Mother     Living  . Hypertension Mother   . Thrombocytopenia Mother   . Hyperlipidemia Father   . Hypertension Father   . Thrombocytopenia Maternal Grandmother   . Stroke Maternal Grandmother   . Hypertension Maternal Grandmother   . Alcohol abuse Maternal Grandfather   . Heart disease Maternal Grandfather   . Parkinson's disease Maternal Grandfather   . Alcohol abuse Paternal Grandfather   . Cancer Paternal Grandfather   . Hypothyroidism Daughter   . Diabetes Daughter     type 1  . Dementia Paternal Grandmother     History   Social History  . Marital Status: Widowed    Spouse Name: N/A    Number of Children: N/A  . Years  of Education: N/A   Occupational History  . Not on file.   Social History Main Topics  . Smoking status: Current Every Day Smoker    Types: Cigarettes  . Smokeless tobacco: Not on file  . Alcohol Use: Yes     Comment: once a year.  . Drug Use: No  . Sexual Activity: Not on file     Comment: lives with son, 77 yo daughter, boyfriend, no dietary   Other Topics Concern  . Not on file   Social History Narrative  . No narrative on file    Current Outpatient Prescriptions on File Prior to Visit  Medication Sig Dispense Refill  . amitriptyline (ELAVIL) 25 MG tablet Take 1 tablet (25 mg total) by mouth at bedtime.  30 tablet  1  . omeprazole (PRILOSEC) 40 MG capsule Take 1 capsule (40 mg total) by mouth daily.  30 capsule  5   No current facility-administered medications on file prior to visit.    Allergies  Allergen Reactions  . Codeine Nausea Only    Review of Systems  Review of Systems  Constitutional: Negative for fever and malaise/fatigue.  HENT: Negative for congestion.   Eyes: Negative for discharge.  Respiratory: Negative for shortness of breath.   Cardiovascular: Negative for  chest pain, palpitations and leg swelling.  Gastrointestinal: Negative for nausea, abdominal pain and diarrhea.  Genitourinary: Negative for dysuria.  Musculoskeletal: Negative for falls.  Skin: Negative for rash.  Neurological: Negative for loss of consciousness and headaches.  Endo/Heme/Allergies: Negative for polydipsia.  Psychiatric/Behavioral: Negative for depression and suicidal ideas. The patient is not nervous/anxious and does not have insomnia.     Objective  BP 122/84  Pulse 85  Temp(Src) 98.6 F (37 C) (Oral)  Ht 5\' 7"  (1.702 m)  Wt 292 lb 1.3 oz (132.487 kg)  BMI 45.74 kg/m2  SpO2 98%  Physical Exam  Physical Exam  Constitutional: She is oriented to person, place, and time and well-developed, well-nourished, and in no distress. No distress.  HENT:  Head:  Normocephalic and atraumatic.  Eyes: Conjunctivae are normal.  Neck: Neck supple. No thyromegaly present.  Cardiovascular: Normal rate, regular rhythm and normal heart sounds.   No murmur heard. Pulmonary/Chest: Effort normal and breath sounds normal. She has no wheezes.  Abdominal: She exhibits no distension and no mass.  Musculoskeletal: She exhibits no edema.  Lymphadenopathy:    She has no cervical adenopathy.  Neurological: She is alert and oriented to person, place, and time.  Skin: Skin is warm and dry. No rash noted. She is not diaphoretic.  Psychiatric: Memory, affect and judgment normal.    Lab Results  Component Value Date   TSH 2.308 01/13/2014   Lab Results  Component Value Date   WBC 7.1 12/04/2013   HGB 14.4 12/04/2013   HCT 42.4 12/04/2013   MCV 89.3 12/04/2013   PLT 265 12/04/2013   Lab Results  Component Value Date   CREATININE 0.74 01/13/2014   BUN 13 01/13/2014   NA 141 01/13/2014   K 4.2 01/13/2014   CL 108 01/13/2014   CO2 26 01/13/2014   Lab Results  Component Value Date   ALT 17 12/04/2013   AST 19 12/04/2013   ALKPHOS 56 12/04/2013   BILITOT 0.4 12/04/2013   Lab Results  Component Value Date   CHOL 165 01/13/2014   Lab Results  Component Value Date   HDL 40 01/13/2014   Lab Results  Component Value Date   LDLCALC 92 01/13/2014   Lab Results  Component Value Date   TRIG 164* 01/13/2014   Lab Results  Component Value Date   CHOLHDL 4.1 01/13/2014     Assessment & Plan   Other and unspecified hyperlipidemia Encouraged heart healthy diet, increase exercise, avoid trans fats, consider a krill oil cap daily  Tobacco use disorder Encouraged complete cessation. Discussed need to quit as relates to risk of numerous cancers, cardiac and pulmonary disease as well as neurologic complications. Counseled for greater than 3 minutes  RSD (reflex sympathetic dystrophy) Given Lidoderm and Hydrocodone prescriptions

## 2014-02-15 NOTE — Assessment & Plan Note (Signed)
Given Lidoderm and Hydrocodone prescriptions

## 2014-02-16 ENCOUNTER — Telehealth: Payer: Self-pay | Admitting: Family Medicine

## 2014-02-16 DIAGNOSIS — N926 Irregular menstruation, unspecified: Secondary | ICD-10-CM

## 2014-02-16 NOTE — Telephone Encounter (Signed)
Patient states that she saw Dr. Charlett Blake last Friday and had discussed her menstrual cycle. She states that she has not had a cycle for two years but started bleeding Friday night. She wanted to know what she should do?

## 2014-02-16 NOTE — Telephone Encounter (Signed)
Pa faxed

## 2014-02-16 NOTE — Telephone Encounter (Signed)
If she has not had a cycle in 2 years she would benefit from a referral to gyn for further work up. I will set up that referral if she agrees. I also need to know how heavy she is bleeding. If it is very heavy we should check a CBC and maybe start her on some meds while she waits for GYN

## 2014-02-20 ENCOUNTER — Other Ambulatory Visit: Payer: Self-pay | Admitting: Family Medicine

## 2014-02-20 DIAGNOSIS — N950 Postmenopausal bleeding: Secondary | ICD-10-CM

## 2014-02-20 NOTE — Telephone Encounter (Signed)
Pt informed and states she is bleeding "pretty" bad. I ordered cbc. Pt will come do that today and she would like to be referred.  Please advise?

## 2014-02-23 NOTE — Telephone Encounter (Signed)
Left a detailed message stating that I faxed again

## 2014-02-26 ENCOUNTER — Ambulatory Visit
Admission: RE | Admit: 2014-02-26 | Discharge: 2014-02-26 | Disposition: A | Discharge status: Home / Self Care | Payer: BC Managed Care – PPO | Source: Ambulatory Visit

## 2014-02-26 DIAGNOSIS — Z1231 Encounter for screening mammogram for malignant neoplasm of breast: Secondary | ICD-10-CM

## 2014-03-18 ENCOUNTER — Telehealth: Payer: Self-pay | Admitting: Family Medicine

## 2014-03-18 NOTE — Telephone Encounter (Signed)
Received medical records from [facility]

## 2014-04-23 ENCOUNTER — Ambulatory Visit (HOSPITAL_BASED_OUTPATIENT_CLINIC_OR_DEPARTMENT_OTHER)
Admission: RE | Admit: 2014-04-23 | Discharge: 2014-04-23 | Disposition: A | Discharge status: Home / Self Care | Payer: BC Managed Care – PPO | Source: Ambulatory Visit | Attending: Physician Assistant | Admitting: Physician Assistant

## 2014-04-23 ENCOUNTER — Encounter: Payer: Self-pay | Admitting: Physician Assistant

## 2014-04-23 ENCOUNTER — Ambulatory Visit (INDEPENDENT_AMBULATORY_CARE_PROVIDER_SITE_OTHER): Payer: BC Managed Care – PPO | Admitting: Physician Assistant

## 2014-04-23 VITALS — BP 122/78 | HR 98 | Temp 98.2°F | Resp 16 | Ht 67.0 in | Wt 297.0 lb

## 2014-04-23 DIAGNOSIS — Z720 Tobacco use: Secondary | ICD-10-CM

## 2014-04-23 DIAGNOSIS — R4789 Other speech disturbances: Secondary | ICD-10-CM | POA: Insufficient documentation

## 2014-04-23 DIAGNOSIS — F17200 Nicotine dependence, unspecified, uncomplicated: Secondary | ICD-10-CM

## 2014-04-23 DIAGNOSIS — G9050 Complex regional pain syndrome I, unspecified: Secondary | ICD-10-CM

## 2014-04-23 DIAGNOSIS — R200 Anesthesia of skin: Secondary | ICD-10-CM

## 2014-04-23 DIAGNOSIS — R51 Headache: Secondary | ICD-10-CM | POA: Insufficient documentation

## 2014-04-23 DIAGNOSIS — M79609 Pain in unspecified limb: Secondary | ICD-10-CM

## 2014-04-23 DIAGNOSIS — R42 Dizziness and giddiness: Secondary | ICD-10-CM | POA: Insufficient documentation

## 2014-04-23 DIAGNOSIS — J359 Chronic disease of tonsils and adenoids, unspecified: Secondary | ICD-10-CM

## 2014-04-23 DIAGNOSIS — M549 Dorsalgia, unspecified: Secondary | ICD-10-CM

## 2014-04-23 DIAGNOSIS — R209 Unspecified disturbances of skin sensation: Secondary | ICD-10-CM

## 2014-04-23 DIAGNOSIS — IMO0002 Reserved for concepts with insufficient information to code with codable children: Secondary | ICD-10-CM

## 2014-04-23 DIAGNOSIS — R4781 Slurred speech: Secondary | ICD-10-CM

## 2014-04-23 DIAGNOSIS — R202 Paresthesia of skin: Secondary | ICD-10-CM

## 2014-04-23 DIAGNOSIS — M79602 Pain in left arm: Secondary | ICD-10-CM

## 2014-04-23 LAB — CBC
HEMATOCRIT: 38.7 % (ref 36.0–46.0)
HEMOGLOBIN: 13.1 g/dL (ref 12.0–15.0)
MCH: 29.2 pg (ref 26.0–34.0)
MCHC: 33.9 g/dL (ref 30.0–36.0)
MCV: 86.2 fL (ref 78.0–100.0)
Platelets: 286 10*3/uL (ref 150–400)
RBC: 4.49 MIL/uL (ref 3.87–5.11)
RDW: 13.3 % (ref 11.5–15.5)
WBC: 6.6 10*3/uL (ref 4.0–10.5)

## 2014-04-23 LAB — BASIC METABOLIC PANEL
BUN: 11 mg/dL (ref 6–23)
CHLORIDE: 108 meq/L (ref 96–112)
CO2: 24 mEq/L (ref 19–32)
Calcium: 8.9 mg/dL (ref 8.4–10.5)
Creat: 0.73 mg/dL (ref 0.50–1.10)
GLUCOSE: 113 mg/dL — AB (ref 70–99)
POTASSIUM: 4 meq/L (ref 3.5–5.3)
Sodium: 139 mEq/L (ref 135–145)

## 2014-04-23 MED ORDER — HYDROCODONE-ACETAMINOPHEN 7.5-325 MG PO TABS: 1.0000 | ORAL_TABLET | Freq: Two times a day (BID) | ORAL | Status: DC | PRN

## 2014-04-23 MED ORDER — BUPROPION HCL ER (SR) 150 MG PO TB12: ORAL_TABLET | ORAL | Status: DC

## 2014-04-23 NOTE — Patient Instructions (Signed)
Please obtain labs.  I will call you with your results.  Stop by the front desk to schedule a CT scan.  Please continue medications as directed.  Start Wellbutrin.  Start a baby aspirin daily (81 mg tablet).  Follow-up with Dr. Charlett Blake in 1 month.  If workup unremarkable and symptoms recur, please proceed to the ER.

## 2014-04-23 NOTE — Progress Notes (Signed)
Patient presents to clinic today for follow-up of CRPS. Patient with longstanding diagnosis of CRPS/RDS.  Is currently on Soma, Norco and Lidocaine with decent relief of symptoms.  Patient required to have continued follow-up and treatment for her disability. Denies new concern with her CRPS.  Patient also complains of experiencing a severe headache yesterday that last throughout the day and was associated with blurry vision, left neck/shoulder pain, facial numbness and tingling and some difficulty with speech.  Patient also endorses elevated BP throughout the day yesterday.  Patient states symptoms resolved on their own in about 12-16 hours. Denies residual symptom today.  Patient does not have hx of diabetes, hypertension or hyperlipidemia.  Patient is obese and does have a smoking history, so she does have CVA risk factors.  Denies family history of early-age CVA or MI.    Past Medical History  Diagnosis Date  . GERD (gastroesophageal reflux disease)   . Anemia   . Chicken pox   . Depression   . Obesity   . RSD (reflex sympathetic dystrophy)   . Tobacco use disorder 01/14/2014    1ppd  . Other and unspecified hyperlipidemia 01/14/2014  . Kidney stone 02/13/2014    Right, small  . Mid back pain on right side 02/13/2014    Current Outpatient Prescriptions on File Prior to Visit  Medication Sig Dispense Refill  . carisoprodol (SOMA) 350 MG tablet Take 1 tablet (350 mg total) by mouth 4 (four) times daily as needed for muscle spasms.  30 tablet  0  . lidocaine (LIDODERM) 5 % Place 1 patch onto the skin daily. Remove & Discard patch within 12 hours or as directed by MD  90 patch  1  . omeprazole (PRILOSEC) 40 MG capsule Take 1 capsule (40 mg total) by mouth daily.  30 capsule  5  . pramipexole (MIRAPEX) 0.25 MG tablet Take 1 tablet (0.25 mg total) by mouth at bedtime as needed.  30 tablet  3   No current facility-administered medications on file prior to visit.    Allergies  Allergen Reactions   . Codeine Nausea Only    Family History  Problem Relation Age of Onset  . Arthritis Mother     Living  . Hypertension Mother   . Thrombocytopenia Mother   . Hyperlipidemia Father   . Hypertension Father   . Thrombocytopenia Maternal Grandmother   . Stroke Maternal Grandmother   . Hypertension Maternal Grandmother   . Alcohol abuse Maternal Grandfather   . Heart disease Maternal Grandfather   . Parkinson's disease Maternal Grandfather   . Alcohol abuse Paternal Grandfather   . Cancer Paternal Grandfather   . Hypothyroidism Daughter   . Diabetes Daughter     type 1  . Dementia Paternal Grandmother     History   Social History  . Marital Status: Widowed    Spouse Name: N/A    Number of Children: N/A  . Years of Education: N/A   Social History Main Topics  . Smoking status: Current Every Day Smoker    Types: Cigarettes  . Smokeless tobacco: None  . Alcohol Use: Yes     Comment: once a year.  . Drug Use: No  . Sexual Activity: None     Comment: lives with son, 54 yo daughter, boyfriend, no dietary   Other Topics Concern  . None   Social History Narrative  . None   Review of Systems - See HPI.  All other ROS are negative.  BP  122/78  Pulse 98  Temp(Src) 98.2 F (36.8 C) (Oral)  Resp 16  Ht 5\' 7"  (1.702 m)  Wt 297 lb (134.718 kg)  BMI 46.51 kg/m2  SpO2 98%  Physical Exam  Vitals reviewed. Constitutional: She is oriented to person, place, and time and well-developed, well-nourished, and in no distress.  HENT:  Head: Normocephalic and atraumatic.  Right Ear: External ear normal.  Left Ear: External ear normal.  Nose: Nose normal.  Mouth/Throat: Oropharynx is clear and moist. No oropharyngeal exudate.  Eyes: Conjunctivae and EOM are normal. Pupils are equal, round, and reactive to light.  Neck: Neck supple. Carotid bruit is not present. No thyromegaly present.  Cardiovascular: Normal rate, regular rhythm, normal heart sounds and intact distal pulses.    Pulmonary/Chest: Effort normal and breath sounds normal. No respiratory distress. She has no wheezes. She has no rales. She exhibits no tenderness.  Abdominal: Soft. Bowel sounds are normal.  Lymphadenopathy:    She has no cervical adenopathy.  Neurological: She is alert and oriented to person, place, and time. She has normal sensation, normal strength, normal reflexes and intact cranial nerves. She displays facial symmetry, normal stance and normal speech. Coordination normal. GCS score is 15.  Skin: Skin is warm and dry. No rash noted.   Assessment/Plan: Tobacco use disorder Discussed patient's recent symptoms (possiblt TIA) and need for smoking cessation.  Will attempt trial of Wellbutrin.  Follow-up with PCP in 3-4 weeks.   Lesion of tonsil Unclear etiology.  Possibly cyst.  Referral to ENT placed.  Transient ischemic attack (TIA) Symptoms seem consistent with TIA.  Lipids and BP under control. EKG reveals NSR with non-specific T abnormality.  Will refer to Cardiology.  Will obtain labs and CT head. Will watch diet, especially intake of carbs. Daily aspirin. Rx Wellbutrin for tobacco abuse disorder.  If symptoms recur, patient is to go directly to the ER.  RSD (reflex sympathetic dystrophy) Continue follow-up with pain management.  Fax disability paperwork to PCP for completion.

## 2014-04-23 NOTE — Progress Notes (Signed)
Pre visit review using our clinic review tool, if applicable. No additional management support is needed unless otherwise documented below in the visit note/SLS  

## 2014-05-03 DIAGNOSIS — G459 Transient cerebral ischemic attack, unspecified: Secondary | ICD-10-CM | POA: Insufficient documentation

## 2014-05-03 DIAGNOSIS — Z720 Tobacco use: Secondary | ICD-10-CM | POA: Insufficient documentation

## 2014-05-03 DIAGNOSIS — J359 Chronic disease of tonsils and adenoids, unspecified: Secondary | ICD-10-CM | POA: Insufficient documentation

## 2014-05-03 NOTE — Assessment & Plan Note (Signed)
Continue follow-up with pain management.  Fax disability paperwork to PCP for completion.

## 2014-05-03 NOTE — Assessment & Plan Note (Signed)
Unclear etiology.  Possibly cyst.  Referral to ENT placed.

## 2014-05-03 NOTE — Assessment & Plan Note (Signed)
Discussed patient's recent symptoms (possiblt TIA) and need for smoking cessation.  Will attempt trial of Wellbutrin.  Follow-up with PCP in 3-4 weeks.

## 2014-05-03 NOTE — Assessment & Plan Note (Signed)
Symptoms seem consistent with TIA.  Lipids and BP under control. EKG reveals NSR with non-specific T abnormality.  Will refer to Cardiology.  Will obtain labs and CT head. Will watch diet, especially intake of carbs. Daily aspirin. Rx Wellbutrin for tobacco abuse disorder.  If symptoms recur, patient is to go directly to the ER.

## 2014-05-25 ENCOUNTER — Telehealth: Payer: Self-pay

## 2014-05-25 ENCOUNTER — Encounter: Payer: Self-pay | Admitting: Family Medicine

## 2014-05-25 ENCOUNTER — Ambulatory Visit (INDEPENDENT_AMBULATORY_CARE_PROVIDER_SITE_OTHER): Payer: BC Managed Care – PPO | Admitting: Family Medicine

## 2014-05-25 VITALS — BP 110/78 | HR 91 | Temp 98.2°F | Ht 67.0 in | Wt 302.0 lb

## 2014-05-25 DIAGNOSIS — K2100 Gastro-esophageal reflux disease with esophagitis, without bleeding: Secondary | ICD-10-CM

## 2014-05-25 DIAGNOSIS — IMO0002 Reserved for concepts with insufficient information to code with codable children: Secondary | ICD-10-CM

## 2014-05-25 DIAGNOSIS — G9050 Complex regional pain syndrome I, unspecified: Secondary | ICD-10-CM

## 2014-05-25 DIAGNOSIS — K5900 Constipation, unspecified: Secondary | ICD-10-CM

## 2014-05-25 DIAGNOSIS — E669 Obesity, unspecified: Secondary | ICD-10-CM

## 2014-05-25 DIAGNOSIS — M549 Dorsalgia, unspecified: Secondary | ICD-10-CM

## 2014-05-25 HISTORY — DX: Constipation, unspecified: K59.00

## 2014-05-25 MED ORDER — HYDROCODONE-ACETAMINOPHEN 7.5-325 MG PO TABS: 1.0000 | ORAL_TABLET | Freq: Two times a day (BID) | ORAL | Status: DC | PRN

## 2014-05-25 MED ORDER — LIDOCAINE 5 % EX PTCH: 3.0000 | MEDICATED_PATCH | CUTANEOUS | Status: DC

## 2014-05-25 MED ORDER — RANITIDINE HCL 300 MG PO TABS: 300.0000 mg | ORAL_TABLET | Freq: Every day | ORAL | Status: DC | PRN

## 2014-05-25 NOTE — Patient Instructions (Addendum)
Encouraged increased hydration and fiber in diet. Daily probiotics. If bowels not moving can use MOM 2 tbls po in 4 oz of warm prune juice by mouth every 2-3 days. If no results then repeat in 4 hours with  Dulcolax suppository pr, may repeat again in 4 more hours as needed. Seek care if symptoms worsen. Consider daily Miralax and/or Dulcolax if symptoms persist.   Benefiber twice a day  Probiotic daily such as Acidophilus   Needs labs prior to visit in September  Try Mylanta for heartburn/chest pain seek care if does not resolve  Constipation Constipation is when a person has fewer than three bowel movements a week, has difficulty having a bowel movement, or has stools that are dry, hard, or larger than normal. As people grow older, constipation is more common. If you try to fix constipation with medicines that make you have a bowel movement (laxatives), the problem may get worse. Long-term laxative use may cause the muscles of the colon to become weak. A low-fiber diet, not taking in enough fluids, and taking certain medicines may make constipation worse.  CAUSES   Certain medicines, such as antidepressants, pain medicine, iron supplements, antacids, and water pills.   Certain diseases, such as diabetes, irritable bowel syndrome (IBS), thyroid disease, or depression.   Not drinking enough water.   Not eating enough fiber-rich foods.   Stress or travel.   Lack of physical activity or exercise.   Ignoring the urge to have a bowel movement.   Using laxatives too much.  SIGNS AND SYMPTOMS   Having fewer than three bowel movements a week.   Straining to have a bowel movement.   Having stools that are hard, dry, or larger than normal.   Feeling full or bloated.   Pain in the lower abdomen.   Not feeling relief after having a bowel movement.  DIAGNOSIS  Your health care provider will take a medical history and perform a physical exam. Further testing may be done  for severe constipation. Some tests may include:  A barium enema X-ray to examine your rectum, colon, and, sometimes, your small intestine.   A sigmoidoscopy to examine your lower colon.   A colonoscopy to examine your entire colon. TREATMENT  Treatment will depend on the severity of your constipation and what is causing it. Some dietary treatments include drinking more fluids and eating more fiber-rich foods. Lifestyle treatments may include regular exercise. If these diet and lifestyle recommendations do not help, your health care provider may recommend taking over-the-counter laxative medicines to help you have bowel movements. Prescription medicines may be prescribed if over-the-counter medicines do not work.  HOME CARE INSTRUCTIONS   Eat foods that have a lot of fiber, such as fruits, vegetables, whole grains, and beans.  Limit foods high in fat and processed sugars, such as french fries, hamburgers, cookies, candies, and soda.   A fiber supplement may be added to your diet if you cannot get enough fiber from foods.   Drink enough fluids to keep your urine clear or pale yellow.   Exercise regularly or as directed by your health care provider.   Go to the restroom when you have the urge to go. Do not hold it.   Only take over-the-counter or prescription medicines as directed by your health care provider. Do not take other medicines for constipation without talking to your health care provider first.  [place] IF:   You have bright red blood in your stool.  Your constipation lasts for more than 4 days or gets worse.   You have abdominal or rectal pain.   You have thin, pencil-like stools.   You have unexplained weight loss. MAKE SURE YOU:   Understand these instructions.  Will watch your condition.  Will get help right away if you are not doing well or get worse. Document Released: 06/30/2004 Document Revised: 10/07/2013 Document Reviewed:  07/14/2013 [facility] Patient Information 2015 [place], [place]. This information is not intended to replace advice given to you by your health care provider. Make sure you discuss any questions you have with your health care provider.

## 2014-05-25 NOTE — Telephone Encounter (Signed)
FYI:  [facility] called stating that patients insurance will only pay for 60 tablets not 90 tablets so she is forfeiting the other 30 tabs

## 2014-05-25 NOTE — Progress Notes (Signed)
Pre visit review using our clinic review tool, if applicable. No additional management support is needed unless otherwise documented below in the visit note. 

## 2014-05-25 NOTE — Progress Notes (Signed)
Patient ID: Beverly Erickson, female   DOB: 09-14-1971, 43 y.o.   MRN: [id number] Beverly Erickson [id number] Feb 22, 1971 05/25/2014      Progress Note-Follow Up  Subjective  Chief Complaint  Chief Complaint  Patient presents with  . Follow-up    1 month     HPI  Patient is a 43 year old female in today for routine medical care. Patient is in today with her mother complaining of persistent  Reflex sympathetic dystrophy pain in her left leg. Has used Lidoderm patches in the past with good results but her insurance is being unhelpful in her getting the medications. Did not get relief from steroids her NSAIDs. Alert he takes hydrocodone and was allergic to codeine. Otherwise she's complaining of increased constipation moving a small bowel movement every 2-3 days. She has increased abdominal pain and over the last 2 days this had increased reflux with a sour taste in her throat. Has also had some atypical chest pains at times. No palpitations or shortness of breath. No fevers or chills or recent illness. No bloody or tarry stool.  Past Medical History  Diagnosis Date  . GERD (gastroesophageal reflux disease)   . Anemia   . Chicken pox   . Depression   . Obesity   . RSD (reflex sympathetic dystrophy)   . Tobacco use disorder 01/14/2014    1ppd  . Other and unspecified hyperlipidemia 01/14/2014  . Kidney stone 02/13/2014    Right, small  . Mid back pain on right side 02/13/2014  . Unspecified constipation 05/25/2014    Past Surgical History  Procedure Laterality Date  . Cesarean section  2000  . Plate and screws in left arm  2005    humerus  . Wisdom tooth extraction  43 yrs old    Family History  Problem Relation Age of Onset  . Arthritis Mother     Living  . Hypertension Mother   . Thrombocytopenia Mother   . Hyperlipidemia Father   . Hypertension Father   . Thrombocytopenia Maternal Grandmother   . Stroke Maternal Grandmother   . Hypertension Maternal Grandmother   . Alcohol  abuse Maternal Grandfather   . Heart disease Maternal Grandfather   . Parkinson's disease Maternal Grandfather   . Alcohol abuse Paternal Grandfather   . Cancer Paternal Grandfather   . Hypothyroidism Daughter   . Diabetes Daughter     type 1  . Dementia Paternal Grandmother     History   Social History  . Marital Status: Widowed    Spouse Name: N/A    Number of Children: N/A  . Years of Education: N/A   Occupational History  . Not on file.   Social History Main Topics  . Smoking status: Current Every Day Smoker    Types: Cigarettes  . Smokeless tobacco: Not on file  . Alcohol Use: Yes     Comment: once a year.  . Drug Use: No  . Sexual Activity: Not on file     Comment: lives with son, 28 yo daughter, boyfriend, no dietary   Other Topics Concern  . Not on file   Social History Narrative  . No narrative on file    Current Outpatient Prescriptions on File Prior to Visit  Medication Sig Dispense Refill  . buPROPion (WELLBUTRIN SR) 150 MG 12 hr tablet Take one tablet daily for 1 week. Then increase to one tablet by mouth twice daily  60 tablet  0  . carisoprodol (SOMA) 350 MG tablet  Take 1 tablet (350 mg total) by mouth 4 (four) times daily as needed for muscle spasms.  30 tablet  0  . omeprazole (PRILOSEC) 40 MG capsule Take 1 capsule (40 mg total) by mouth daily.  30 capsule  5  . Potassium 75 MG TABS Take 1 each by mouth at bedtime.      . pramipexole (MIRAPEX) 0.25 MG tablet Take 1 tablet (0.25 mg total) by mouth at bedtime as needed.  30 tablet  3   No current facility-administered medications on file prior to visit.    Allergies  Allergen Reactions  . Codeine Nausea Only    Review of Systems  Review of Systems  Constitutional: Negative for fever and malaise/fatigue.  HENT: Negative for congestion.   Eyes: Negative for discharge.  Respiratory: Negative for shortness of breath.   Cardiovascular: Positive for chest pain. Negative for palpitations and leg  swelling.  Gastrointestinal: Positive for heartburn, abdominal pain and constipation. Negative for nausea, vomiting and diarrhea.  Genitourinary: Negative for dysuria.  Musculoskeletal: Positive for joint pain. Negative for falls.       Left leg pain  Skin: Negative for rash.  Neurological: Negative for loss of consciousness and headaches.  Endo/Heme/Allergies: Negative for polydipsia.  Psychiatric/Behavioral: Negative for depression and suicidal ideas. The patient is not nervous/anxious and does not have insomnia.     Objective  BP 110/78  Pulse 91  Temp(Src) 98.2 F (36.8 C) (Oral)  Ht 5\' 7"  (1.702 m)  Wt 302 lb 0.6 oz (137.004 kg)  BMI 47.29 kg/m2  SpO2 97%  Physical Exam  Physical Exam  Constitutional: She is oriented to person, place, and time and well-developed, well-nourished, and in no distress. No distress.  HENT:  Head: Normocephalic and atraumatic.  Eyes: Conjunctivae are normal.  Neck: Neck supple. No thyromegaly present.  Cardiovascular: Normal rate, regular rhythm and normal heart sounds.   No murmur heard. Pulmonary/Chest: Effort normal and breath sounds normal. She has no wheezes.  Abdominal: She exhibits no distension and no mass.  Musculoskeletal: She exhibits no edema.  Lymphadenopathy:    She has no cervical adenopathy.  Neurological: She is alert and oriented to person, place, and time.  Skin: Skin is warm and dry. No rash noted. She is not diaphoretic.  Psychiatric: Memory, affect and judgment normal.    Lab Results  Component Value Date   TSH 2.308 01/13/2014   Lab Results  Component Value Date   WBC 6.6 04/23/2014   HGB 13.1 04/23/2014   HCT 38.7 04/23/2014   MCV 86.2 04/23/2014   PLT 286 04/23/2014   Lab Results  Component Value Date   CREATININE 0.73 04/23/2014   BUN 11 04/23/2014   NA 139 04/23/2014   K 4.0 04/23/2014   CL 108 04/23/2014   CO2 24 04/23/2014   Lab Results  Component Value Date   ALT 17 12/04/2013   AST 19 12/04/2013   ALKPHOS 56  12/04/2013   BILITOT 0.4 12/04/2013   Lab Results  Component Value Date   CHOL 165 01/13/2014   Lab Results  Component Value Date   HDL 40 01/13/2014   Lab Results  Component Value Date   LDLCALC 92 01/13/2014   Lab Results  Component Value Date   TRIG 164* 01/13/2014   Lab Results  Component Value Date   CHOLHDL 4.1 01/13/2014     Assessment & Plan  GERD (gastroesophageal reflux disease) Avoid offending foods, start probiotics. Do not eat large meals in  late evening and consider raising head of bed. Continue Omeprazole and add Ranitidine. Is having some intermittent atypical CP likely related to dyspepsia, will try Mylanta prn and request records from [place] where  She reports having a cardiac work up in the past couple of years. In the meantime if she experiences pain that is unremitting and intolerable she should proceed to ED  RSD (reflex sympathetic dystrophy) Pain noted throughout LLE good response to Lidoderm patches in past, having trouble getting insurance to approve at this time. Will try again.Allergic to Codeine  failed NSAIDs and steroids, no response Good response to these patches in past Already on Hydrocodone daily with incomplete response  Obesity Encouraged DASH diet, decrease po intake and increase exercise as tolerated. Needs 7-8 hours of sleep nightly. Avoid trans fats, eat small, frequent meals every 4-5 hours with lean proteins, complex carbs and healthy fats. Minimize simple carbs, GMO foods.  Unspecified constipation Encouraged increased hydration and fiber in diet. Daily probiotics. If bowels not moving can use MOM 2 tbls po in 4 oz of warm prune juice by mouth every 2-3 days. If no results then repeat in 4 hours with  Dulcolax suppository pr, may repeat again in 4 more hours as needed. Seek care if symptoms worsen. Consider daily Miralax and/or Dulcolax if symptoms persist.

## 2014-05-25 NOTE — Assessment & Plan Note (Signed)
Encouraged DASH diet, decrease po intake and increase exercise as tolerated. Needs 7-8 hours of sleep nightly. Avoid trans fats, eat small, frequent meals every 4-5 hours with lean proteins, complex carbs and healthy fats. Minimize simple carbs, GMO foods. 

## 2014-05-25 NOTE — Assessment & Plan Note (Signed)
Pain noted throughout LLE good response to Lidoderm patches in past, having trouble getting insurance to approve at this time. Will try again.Allergic to Codeine  failed NSAIDs and steroids, no response Good response to these patches in past Already on Hydrocodone daily with incomplete response

## 2014-05-25 NOTE — Assessment & Plan Note (Addendum)
Avoid offending foods, start probiotics. Do not eat large meals in late evening and consider raising head of bed. Continue Omeprazole and add Ranitidine. Is having some intermittent atypical CP likely related to dyspepsia, will try Mylanta prn and request records from [place] where  She reports having a cardiac work up in the past couple of years. In the meantime if she experiences pain that is unremitting and intolerable she should proceed to ED

## 2014-05-25 NOTE — Assessment & Plan Note (Addendum)
Encouraged increased hydration and fiber in diet. Daily probiotics. If bowels not moving can use MOM 2 tbls po in 4 oz of warm prune juice by mouth every 2-3 days. If no results then repeat in 4 hours with  Dulcolax suppository pr, may repeat again in 4 more hours as needed. Seek care if symptoms worsen. Consider daily Miralax and/or Dulcolax if symptoms persist.  

## 2014-06-29 ENCOUNTER — Encounter: Payer: Self-pay | Admitting: Family Medicine

## 2014-06-29 ENCOUNTER — Ambulatory Visit (INDEPENDENT_AMBULATORY_CARE_PROVIDER_SITE_OTHER): Payer: BC Managed Care – PPO | Admitting: Family Medicine

## 2014-06-29 VITALS — BP 119/84 | HR 83 | Temp 98.6°F | Ht 67.0 in | Wt 294.6 lb

## 2014-06-29 DIAGNOSIS — Z720 Tobacco use: Secondary | ICD-10-CM

## 2014-06-29 DIAGNOSIS — K219 Gastro-esophageal reflux disease without esophagitis: Secondary | ICD-10-CM

## 2014-06-29 DIAGNOSIS — Z0000 Encounter for general adult medical examination without abnormal findings: Secondary | ICD-10-CM

## 2014-06-29 DIAGNOSIS — E785 Hyperlipidemia, unspecified: Secondary | ICD-10-CM

## 2014-06-29 DIAGNOSIS — E669 Obesity, unspecified: Secondary | ICD-10-CM

## 2014-06-29 DIAGNOSIS — Z124 Encounter for screening for malignant neoplasm of cervix: Secondary | ICD-10-CM

## 2014-06-29 DIAGNOSIS — G90529 Complex regional pain syndrome I of unspecified lower limb: Secondary | ICD-10-CM

## 2014-06-29 DIAGNOSIS — G90522 Complex regional pain syndrome I of left lower limb: Secondary | ICD-10-CM

## 2014-06-29 DIAGNOSIS — R0602 Shortness of breath: Secondary | ICD-10-CM

## 2014-06-29 DIAGNOSIS — F17200 Nicotine dependence, unspecified, uncomplicated: Secondary | ICD-10-CM

## 2014-06-29 MED ORDER — ALBUTEROL SULFATE HFA 108 (90 BASE) MCG/ACT IN AERS: 2.0000 | INHALATION_SPRAY | Freq: Four times a day (QID) | RESPIRATORY_TRACT | Status: DC | PRN

## 2014-06-29 MED ORDER — BUPROPION HCL ER (SR) 150 MG PO TB12: ORAL_TABLET | ORAL | Status: DC

## 2014-06-29 MED ORDER — ALPRAZOLAM 0.25 MG PO TABS: 0.2500 mg | ORAL_TABLET | Freq: Two times a day (BID) | ORAL | Status: DC | PRN

## 2014-06-29 NOTE — Patient Instructions (Signed)
Ice and aspercrem/salon pas 2-3 x daily. Splint on arm, rest and call if no relief  Tennis Elbow Your caregiver has diagnosed you with a condition often referred to as "tennis elbow." This results from small tears or soreness (inflammation) at the start (origin) of the extensor muscles of the forearm. Although the condition is often called tennis or golfer's elbow, it is caused by any repetitive action performed by your elbow. HOME CARE INSTRUCTIONS  If the condition has been short lived, rest may be the only treatment required. Using your opposite hand or arm to perform the task may help. Even changing your grip may help rest the extremity. These may even prevent the condition from recurring.  Longer standing problems, however, will often be relieved faster by:  Using anti-inflammatory agents.  Applying ice packs for 30 minutes at the end of the working day, at bed time, or when activities are finished.  Your caregiver may also have you wear a splint or sling. This will allow the inflamed tendon to heal. At times, steroid injections aided with a local anesthetic will be required along with splinting for 1 to 2 weeks. Two to three steroid injections will often solve the problem. In some long standing cases, the inflamed tendon does not respond to conservative (non-surgical) therapy. Then surgery may be required to repair it. MAKE SURE YOU:   Understand these instructions.  Will watch your condition.  Will get help right away if you are not doing well or get worse. Document Released: 10/02/2005 Document Revised: 12/25/2011 Document Reviewed: 05/20/2008 [facility] Patient Information 2015 [place], [place]. This information is not intended to replace advice given to you by your health care provider. Make sure you discuss any questions you have with your health care provider.

## 2014-06-29 NOTE — Progress Notes (Signed)
Pre visit review using our clinic review tool, if applicable. No additional management support is needed unless otherwise documented below in the visit note. 

## 2014-07-05 ENCOUNTER — Encounter: Payer: Self-pay | Admitting: Family Medicine

## 2014-07-05 DIAGNOSIS — Z0000 Encounter for general adult medical examination without abnormal findings: Secondary | ICD-10-CM

## 2014-07-05 HISTORY — DX: Encounter for general adult medical examination without abnormal findings: Z00.00

## 2014-07-05 NOTE — Assessment & Plan Note (Signed)
Avoid offending foods, start probiotics. Do not eat large meals in late evening and consider raising head of bed.  

## 2014-07-05 NOTE — Assessment & Plan Note (Signed)
Patient encouraged to maintain heart healthy diet, regular exercise, adequate sleep. Consider daily probiotics. Take medications as prescribed 

## 2014-07-05 NOTE — Assessment & Plan Note (Signed)
Encouraged heart healthy diet, increase exercise, avoid trans fats, consider a krill oil cap daily 

## 2014-07-05 NOTE — Assessment & Plan Note (Signed)
Encouraged DASH diet, decrease po intake and increase exercise as tolerated. Needs 7-8 hours of sleep nightly. Avoid trans fats, eat small, frequent meals every 4-5 hours with lean proteins, complex carbs and healthy fats. Minimize simple carbs, GMO foods. 

## 2014-07-05 NOTE — Assessment & Plan Note (Signed)
Left side. Managing pain daily

## 2014-07-05 NOTE — Assessment & Plan Note (Signed)
Encouraged complete cessation. Discussed need to quit as relates to risk of numerous cancers, cardiac and pulmonary disease as well as neurologic complications. Counseled for greater than 3 minutes 

## 2014-07-05 NOTE — Progress Notes (Signed)
Patient ID: Beverly Erickson, female   DOB: 10/22/1970, 43 y.o.   MRN: [id number] Sherrian Nunnelley [id number] 1970-10-27 07/05/2014      Progress Note-Follow Up  Subjective  Chief Complaint  Chief Complaint  Patient presents with  . Annual Exam    physical    HPI  Patient is a 43 year old female in today for routine medical care. Generally doing a lot of lifting she is reflex sympathetic dystrophy in significant discomfort in her left leg. No recent illness but she is noting some increased GI issues with epigastric pain, heartburn and intermittent constipation. Denies CP/palp/SOB/HA/congestion/fevers/GI or GU c/o. Taking meds as prescribed  Past Medical History  Diagnosis Date  . GERD (gastroesophageal reflux disease)   . Anemia   . Chicken pox   . Depression   . Obesity   . RSD (reflex sympathetic dystrophy)   . Tobacco use disorder 01/14/2014    1ppd  . Other and unspecified hyperlipidemia 01/14/2014  . Kidney stone 02/13/2014    Right, small  . Mid back pain on right side 02/13/2014  . Unspecified constipation 05/25/2014  . Preventative health care 07/05/2014    Past Surgical History  Procedure Laterality Date  . Cesarean section  2000  . Plate and screws in left arm  2005    humerus  . Wisdom tooth extraction  43 yrs old    Family History  Problem Relation Age of Onset  . Arthritis Mother     Living  . Hypertension Mother   . Thrombocytopenia Mother   . Hyperlipidemia Father   . Hypertension Father   . Thrombocytopenia Maternal Grandmother   . Stroke Maternal Grandmother   . Hypertension Maternal Grandmother   . Alcohol abuse Maternal Grandfather   . Heart disease Maternal Grandfather   . Parkinson's disease Maternal Grandfather   . Alcohol abuse Paternal Grandfather   . Cancer Paternal Grandfather   . Hypothyroidism Daughter   . Diabetes Daughter     type 1  . Dementia Paternal Grandmother     History   Social History  . Marital Status: Widowed    Spouse  Name: N/A    Number of Children: N/A  . Years of Education: N/A   Occupational History  . Not on file.   Social History Main Topics  . Smoking status: Current Every Day Smoker    Types: Cigarettes  . Smokeless tobacco: Not on file  . Alcohol Use: Yes     Comment: once a year.  . Drug Use: No  . Sexual Activity: Not on file     Comment: lives with son, 64 yo daughter, boyfriend, no dietary   Other Topics Concern  . Not on file   Social History Narrative  . No narrative on file    Current Outpatient Prescriptions on File Prior to Visit  Medication Sig Dispense Refill  . aspirin 81 MG tablet Take 81 mg by mouth daily.      . carisoprodol (SOMA) 350 MG tablet Take 1 tablet (350 mg total) by mouth 4 (four) times daily as needed for muscle spasms.  30 tablet  0  . HYDROcodone-acetaminophen (NORCO) 7.5-325 MG per tablet Take 1 tablet by mouth 2 (two) times daily as needed.  90 tablet  0  . KRILL OIL PO Take 1 capsule by mouth daily.      Marland Kitchen lidocaine (LIDODERM) 5 % Place 3 patches onto the skin daily. Remove & Discard patch within 12 hours or as directed by  MD Allergic to Codeine  failed NSAIDs and steroids, no response Good response to these patches in past Already on Hydrocodone daily with incomplete response  Already on hydrocodone daily with inadequate response  90 patch  5  . omeprazole (PRILOSEC) 40 MG capsule Take 1 capsule (40 mg total) by mouth daily.  30 capsule  5  . Potassium 75 MG TABS Take 1 each by mouth at bedtime.      . pramipexole (MIRAPEX) 0.25 MG tablet Take 1 tablet (0.25 mg total) by mouth at bedtime as needed.  30 tablet  3  . ranitidine (ZANTAC) 300 MG tablet Take 1 tablet (300 mg total) by mouth daily as needed for heartburn.  30 tablet  3   No current facility-administered medications on file prior to visit.    Allergies  Allergen Reactions  . Codeine Nausea Only    Review of Systems  Review of Systems  Constitutional: Positive for  malaise/fatigue. Negative for fever and chills.  HENT: Negative for congestion, hearing loss and nosebleeds.   Eyes: Negative for discharge.  Respiratory: Negative for cough, sputum production, shortness of breath and wheezing.   Cardiovascular: Negative for chest pain, palpitations and leg swelling.  Gastrointestinal: Positive for heartburn. Negative for nausea, vomiting, abdominal pain, diarrhea, constipation and blood in stool.  Genitourinary: Negative for dysuria, urgency, frequency and hematuria.  Musculoskeletal: Positive for myalgias. Negative for back pain and falls.  Skin: Negative for rash.  Neurological: Negative for dizziness, tremors, sensory change, focal weakness, loss of consciousness, weakness and headaches.  Endo/Heme/Allergies: Negative for polydipsia. Does not bruise/bleed easily.  Psychiatric/Behavioral: Negative for depression and suicidal ideas. The patient is not nervous/anxious and does not have insomnia.     Objective  BP 119/84  Pulse 83  Temp(Src) 98.6 F (37 C) (Oral)  Ht 5\' 7"  (1.702 m)  Wt 294 lb 9.6 oz (133.63 kg)  BMI 46.13 kg/m2  SpO2 99%  Physical Exam  Physical Exam  Constitutional: She is oriented to person, place, and time and well-developed, well-nourished, and in no distress. No distress.  HENT:  Head: Normocephalic and atraumatic.  Eyes: Conjunctivae are normal.  Neck: Neck supple. No thyromegaly present.  Cardiovascular: Normal rate, regular rhythm and normal heart sounds.   No murmur heard. Pulmonary/Chest: Effort normal and breath sounds normal. She has no wheezes.  Abdominal: She exhibits no distension and no mass.  Musculoskeletal: She exhibits no edema.  Lymphadenopathy:    She has no cervical adenopathy.  Neurological: She is alert and oriented to person, place, and time.  Skin: Skin is warm and dry. No rash noted. She is not diaphoretic.  Psychiatric: Memory, affect and judgment normal.    Lab Results  Component Value Date    TSH 2.308 01/13/2014   Lab Results  Component Value Date   WBC 6.6 04/23/2014   HGB 13.1 04/23/2014   HCT 38.7 04/23/2014   MCV 86.2 04/23/2014   PLT 286 04/23/2014   Lab Results  Component Value Date   CREATININE 0.73 04/23/2014   BUN 11 04/23/2014   NA 139 04/23/2014   K 4.0 04/23/2014   CL 108 04/23/2014   CO2 24 04/23/2014   Lab Results  Component Value Date   ALT 17 12/04/2013   AST 19 12/04/2013   ALKPHOS 56 12/04/2013   BILITOT 0.4 12/04/2013   Lab Results  Component Value Date   CHOL 165 01/13/2014   Lab Results  Component Value Date   HDL 40 01/13/2014  Lab Results  Component Value Date   LDLCALC 92 01/13/2014   Lab Results  Component Value Date   TRIG 164* 01/13/2014   Lab Results  Component Value Date   CHOLHDL 4.1 01/13/2014     Assessment & Plan  GERD (gastroesophageal reflux disease) Avoid offending foods, start probiotics. Do not eat large meals in late evening and consider raising head of bed.   Complex regional pain syndrome I of lower limb Left side. Managing pain daily  Obesity Encouraged DASH diet, decrease po intake and increase exercise as tolerated. Needs 7-8 hours of sleep nightly. Avoid trans fats, eat small, frequent meals every 4-5 hours with lean proteins, complex carbs and healthy fats. Minimize simple carbs, GMO foods.  Other and unspecified hyperlipidemia Encouraged heart healthy diet, increase exercise, avoid trans fats, consider a krill oil cap daily  Tobacco use disorder Encouraged complete cessation. Discussed need to quit as relates to risk of numerous cancers, cardiac and pulmonary disease as well as neurologic complications. Counseled for greater than 3 minutes  Preventative health care Patient encouraged to maintain heart healthy diet, regular exercise, adequate sleep. Consider daily probiotics. Take medications as prescribed

## 2014-09-22 ENCOUNTER — Encounter: Payer: Self-pay | Admitting: Family Medicine

## 2014-09-22 ENCOUNTER — Telehealth: Payer: Self-pay

## 2014-09-22 ENCOUNTER — Ambulatory Visit (INDEPENDENT_AMBULATORY_CARE_PROVIDER_SITE_OTHER): Payer: BC Managed Care – PPO | Admitting: Family Medicine

## 2014-09-22 VITALS — BP 127/72 | HR 91 | Temp 98.0°F | Ht 67.0 in | Wt 287.8 lb

## 2014-09-22 DIAGNOSIS — K2100 Gastro-esophageal reflux disease with esophagitis, without bleeding: Secondary | ICD-10-CM

## 2014-09-22 DIAGNOSIS — Z720 Tobacco use: Secondary | ICD-10-CM

## 2014-09-22 DIAGNOSIS — K219 Gastro-esophageal reflux disease without esophagitis: Secondary | ICD-10-CM

## 2014-09-22 DIAGNOSIS — M549 Dorsalgia, unspecified: Secondary | ICD-10-CM

## 2014-09-22 DIAGNOSIS — IMO0002 Reserved for concepts with insufficient information to code with codable children: Secondary | ICD-10-CM

## 2014-09-22 DIAGNOSIS — J209 Acute bronchitis, unspecified: Secondary | ICD-10-CM

## 2014-09-22 DIAGNOSIS — F17200 Nicotine dependence, unspecified, uncomplicated: Secondary | ICD-10-CM

## 2014-09-22 DIAGNOSIS — G9050 Complex regional pain syndrome I, unspecified: Secondary | ICD-10-CM

## 2014-09-22 MED ORDER — CEFDINIR 300 MG PO CAPS: 300.0000 mg | ORAL_CAPSULE | Freq: Two times a day (BID) | ORAL | Status: AC

## 2014-09-22 MED ORDER — OMEPRAZOLE 40 MG PO CPDR: 40.0000 mg | DELAYED_RELEASE_CAPSULE | Freq: Every day | ORAL | Status: DC

## 2014-09-22 MED ORDER — BUPROPION HCL ER (SR) 150 MG PO TB12: ORAL_TABLET | ORAL | Status: DC

## 2014-09-22 MED ORDER — METHYLPREDNISOLONE (PAK) 4 MG PO TABS: ORAL_TABLET | ORAL | Status: DC

## 2014-09-22 MED ORDER — HYDROCODONE-HOMATROPINE 5-1.5 MG/5ML PO SYRP: 5.0000 mL | ORAL_SOLUTION | Freq: Three times a day (TID) | ORAL | Status: DC | PRN

## 2014-09-22 MED ORDER — RANITIDINE HCL 300 MG PO TABS: 300.0000 mg | ORAL_TABLET | Freq: Every day | ORAL | Status: DC | PRN

## 2014-09-22 MED ORDER — HYDROCODONE-ACETAMINOPHEN 7.5-325 MG PO TABS: 1.0000 | ORAL_TABLET | Freq: Two times a day (BID) | ORAL | Status: DC | PRN

## 2014-09-22 NOTE — Patient Instructions (Signed)

## 2014-09-22 NOTE — Progress Notes (Signed)
Pre visit review using our clinic review tool, if applicable. No additional management support is needed unless otherwise documented below in the visit note. 

## 2014-09-22 NOTE — Telephone Encounter (Signed)
fyi

## 2014-09-22 NOTE — Telephone Encounter (Signed)
[facility] called to say that he was going to fill Norco for 60 pills instead of 90 and they extra will disappear forever. Any questions call him.

## 2014-09-28 ENCOUNTER — Encounter: Payer: Self-pay | Admitting: Family Medicine

## 2014-09-28 DIAGNOSIS — J209 Acute bronchitis, unspecified: Secondary | ICD-10-CM | POA: Insufficient documentation

## 2014-09-28 NOTE — Assessment & Plan Note (Signed)
Encouraged complete cessation. Discussed need to quit as relates to risk of numerous cancers, cardiac and pulmonary disease as well as neurologic complications. Counseled for greater than 3 minutes. Has cut back with acute illness. Encouraged not to increase as feels better then to try and quit from there

## 2014-09-28 NOTE — Assessment & Plan Note (Addendum)
Started on antibiotics, use Albuterol. Encouraged increased rest and hydration, add probiotics, zinc such as Coldeze or Xicam. Treat fevers as needed. Use hycodan prn for cough

## 2014-09-28 NOTE — Assessment & Plan Note (Signed)
Avoid offending foods, start probiotics. Do not eat large meals in late evening and consider raising head of bed.  

## 2014-09-28 NOTE — Progress Notes (Signed)
Beverly Erickson  [id number] 02/05/1971 09/28/2014      Progress Note-Follow Up  Subjective  Chief Complaint  Chief Complaint  Patient presents with  . Cough    X 1.5 weeks- productive green phlegm  . Epistaxis    1 yesterday morning  . Headache    X 1.5 weeks  . Sore Throat    X 1.5 weeks    HPI  Patient is a 43 y.o. female in today for routine medical care. Patient is in today with 1-2 week history of cough and congestion. Over the last several days it is worsened. She's been struggling with a headache and sore throat. She's had nasal congestion productive of green phlegm and now having nosebleeds. Is noting some wheezing and shortness of breath worse with exertion. Cough is keeping her up at night. She denies fevers, chills, chest pain or palpitations. No GI or GU complaints.  Past Medical History  Diagnosis Date  . GERD (gastroesophageal reflux disease)   . Anemia   . Chicken pox   . Depression   . Obesity   . RSD (reflex sympathetic dystrophy)   . Tobacco use disorder 01/14/2014    1ppd  . Other and unspecified hyperlipidemia 01/14/2014  . Kidney stone 02/13/2014    Right, small  . Mid back pain on right side 02/13/2014  . Unspecified constipation 05/25/2014  . Preventative health care 07/05/2014    Past Surgical History  Procedure Laterality Date  . Cesarean section  2000  . Plate and screws in left arm  2005    humerus  . Wisdom tooth extraction  43 yrs old    Family History  Problem Relation Age of Onset  . Arthritis Mother     Living  . Hypertension Mother   . Thrombocytopenia Mother   . Hyperlipidemia Father   . Hypertension Father   . Thrombocytopenia Maternal Grandmother   . Stroke Maternal Grandmother   . Hypertension Maternal Grandmother   . Alcohol abuse Maternal Grandfather   . Heart disease Maternal Grandfather   . Parkinson's disease Maternal Grandfather   . Alcohol abuse Paternal Grandfather   . Cancer Paternal Grandfather   .  Hypothyroidism Daughter   . Diabetes Daughter     type 1  . Dementia Paternal Grandmother     History   Social History  . Marital Status: Widowed    Spouse Name: N/A    Number of Children: N/A  . Years of Education: N/A   Occupational History  . Not on file.   Social History Main Topics  . Smoking status: Current Every Day Smoker    Types: Cigarettes  . Smokeless tobacco: Not on file  . Alcohol Use: Yes     Comment: once a year.  . Drug Use: No  . Sexual Activity: Not on file     Comment: lives with son, 39 yo daughter, boyfriend, no dietary   Other Topics Concern  . Not on file   Social History Narrative    Current Outpatient Prescriptions on File Prior to Visit  Medication Sig Dispense Refill  . albuterol (PROVENTIL HFA;VENTOLIN HFA) 108 (90 BASE) MCG/ACT inhaler Inhale 2 puffs into the lungs every 6 (six) hours as needed for wheezing or shortness of breath. 1 Inhaler 2  . ALPRAZolam (XANAX) 0.25 MG tablet Take 1 tablet (0.25 mg total) by mouth 2 (two) times daily as needed for anxiety. 20 tablet 0  . aspirin 81 MG tablet Take 81 mg by mouth  daily.    . carisoprodol (SOMA) 350 MG tablet Take 1 tablet (350 mg total) by mouth 4 (four) times daily as needed for muscle spasms. 30 tablet 0  . KRILL OIL PO Take 1 capsule by mouth daily.    Marland Kitchen lidocaine (LIDODERM) 5 % Place 3 patches onto the skin daily. Remove & Discard patch within 12 hours or as directed by MD Allergic to Codeine  failed NSAIDs and steroids, no response Good response to these patches in past Already on Hydrocodone daily with incomplete response  Already on hydrocodone daily with inadequate response 90 patch 5  . Potassium 75 MG TABS Take 1 each by mouth at bedtime.    . pramipexole (MIRAPEX) 0.25 MG tablet Take 1 tablet (0.25 mg total) by mouth at bedtime as needed. 30 tablet 3   No current facility-administered medications on file prior to visit.    Allergies  Allergen Reactions  . Codeine Nausea  Only    Review of Systems  Review of Systems  Constitutional: Positive for malaise/fatigue. Negative for fever and chills.  HENT: Positive for congestion, nosebleeds and sore throat.   Eyes: Negative for discharge.  Respiratory: Positive for sputum production, shortness of breath and wheezing.   Cardiovascular: Negative for chest pain, palpitations and leg swelling.  Gastrointestinal: Negative for nausea, abdominal pain and diarrhea.  Genitourinary: Negative for dysuria.  Musculoskeletal: Positive for myalgias. Negative for falls.  Skin: Negative for rash.  Neurological: Positive for headaches. Negative for loss of consciousness.  Endo/Heme/Allergies: Negative for polydipsia.  Psychiatric/Behavioral: Negative for depression and suicidal ideas. The patient is not nervous/anxious and does not have insomnia.     Objective  BP 127/72 mmHg  Pulse 91  Temp(Src) 98 F (36.7 C) (Oral)  Ht 5\' 7"  (1.702 m)  Wt 287 lb 12.8 oz (130.545 kg)  BMI 45.07 kg/m2  SpO2 98%  Physical Exam  Physical Exam  Constitutional: She is oriented to person, place, and time and well-developed, well-nourished, and in no distress. No distress.  HENT:  Head: Normocephalic and atraumatic.  Nasal mucosa boggy and erythematous. Scab noted in right nares  Eyes: Conjunctivae are normal.  Neck: Neck supple. No thyromegaly present.  Cardiovascular: Normal rate, regular rhythm and normal heart sounds.   No murmur heard. Pulmonary/Chest: Effort normal. She has wheezes.  Abdominal: She exhibits no distension and no mass.  Musculoskeletal: She exhibits no edema.  Lymphadenopathy:    She has no cervical adenopathy.  Neurological: She is alert and oriented to person, place, and time.  Skin: Skin is warm and dry. No rash noted. She is not diaphoretic.  Psychiatric: Memory, affect and judgment normal.    Lab Results  Component Value Date   TSH 2.308 01/13/2014   Lab Results  Component Value Date   WBC 6.6  04/23/2014   HGB 13.1 04/23/2014   HCT 38.7 04/23/2014   MCV 86.2 04/23/2014   PLT 286 04/23/2014   Lab Results  Component Value Date   CREATININE 0.73 04/23/2014   BUN 11 04/23/2014   NA 139 04/23/2014   K 4.0 04/23/2014   CL 108 04/23/2014   CO2 24 04/23/2014   Lab Results  Component Value Date   ALT 17 12/04/2013   AST 19 12/04/2013   ALKPHOS 56 12/04/2013   BILITOT 0.4 12/04/2013   Lab Results  Component Value Date   CHOL 165 01/13/2014   Lab Results  Component Value Date   HDL 40 01/13/2014   Lab Results  Component Value Date   LDLCALC 92 01/13/2014   Lab Results  Component Value Date   TRIG 164* 01/13/2014   Lab Results  Component Value Date   CHOLHDL 4.1 01/13/2014     Assessment & Plan  GERD (gastroesophageal reflux disease) Avoid offending foods, start probiotics. Do not eat large meals in late evening and consider raising head of bed.   Tobacco use disorder Encouraged complete cessation. Discussed need to quit as relates to risk of numerous cancers, cardiac and pulmonary disease as well as neurologic complications. Counseled for greater than 3 minutes. Has cut back with acute illness. Encouraged not to increase as feels better then to try and quit from there  Acute bronchitis Started on antibiotics, use Albuterol. Encouraged increased rest and hydration, add probiotics, zinc such as Coldeze or Xicam. Treat fevers as needed. Use hycodan prn for cough

## 2014-10-06 ENCOUNTER — Other Ambulatory Visit: Payer: BC Managed Care – PPO

## 2014-10-13 ENCOUNTER — Ambulatory Visit: Payer: BC Managed Care – PPO | Admitting: Family Medicine

## 2014-11-14 DIAGNOSIS — R072 Precordial pain: Secondary | ICD-10-CM | POA: Diagnosis not present

## 2014-11-14 DIAGNOSIS — R05 Cough: Secondary | ICD-10-CM | POA: Diagnosis not present

## 2014-11-14 DIAGNOSIS — R0781 Pleurodynia: Secondary | ICD-10-CM | POA: Diagnosis not present

## 2015-01-18 ENCOUNTER — Ambulatory Visit (INDEPENDENT_AMBULATORY_CARE_PROVIDER_SITE_OTHER): Payer: Medicare Other | Admitting: Family Medicine

## 2015-01-18 ENCOUNTER — Encounter: Payer: Self-pay | Admitting: Family Medicine

## 2015-01-18 VITALS — BP 118/72 | HR 100 | Temp 98.3°F | Resp 20 | Ht 66.5 in | Wt 277.0 lb

## 2015-01-18 DIAGNOSIS — R05 Cough: Secondary | ICD-10-CM

## 2015-01-18 DIAGNOSIS — R0602 Shortness of breath: Secondary | ICD-10-CM | POA: Diagnosis not present

## 2015-01-18 DIAGNOSIS — R739 Hyperglycemia, unspecified: Secondary | ICD-10-CM

## 2015-01-18 DIAGNOSIS — R3589 Other polyuria: Secondary | ICD-10-CM

## 2015-01-18 DIAGNOSIS — Z720 Tobacco use: Secondary | ICD-10-CM

## 2015-01-18 DIAGNOSIS — K5900 Constipation, unspecified: Secondary | ICD-10-CM

## 2015-01-18 DIAGNOSIS — K219 Gastro-esophageal reflux disease without esophagitis: Secondary | ICD-10-CM

## 2015-01-18 DIAGNOSIS — E782 Mixed hyperlipidemia: Secondary | ICD-10-CM

## 2015-01-18 DIAGNOSIS — E785 Hyperlipidemia, unspecified: Secondary | ICD-10-CM

## 2015-01-18 DIAGNOSIS — K2100 Gastro-esophageal reflux disease with esophagitis, without bleeding: Secondary | ICD-10-CM

## 2015-01-18 DIAGNOSIS — J209 Acute bronchitis, unspecified: Secondary | ICD-10-CM

## 2015-01-18 DIAGNOSIS — R358 Other polyuria: Secondary | ICD-10-CM

## 2015-01-18 DIAGNOSIS — M549 Dorsalgia, unspecified: Secondary | ICD-10-CM

## 2015-01-18 DIAGNOSIS — G9050 Complex regional pain syndrome I, unspecified: Secondary | ICD-10-CM

## 2015-01-18 DIAGNOSIS — R631 Polydipsia: Secondary | ICD-10-CM

## 2015-01-18 DIAGNOSIS — IMO0002 Reserved for concepts with insufficient information to code with codable children: Secondary | ICD-10-CM

## 2015-01-18 DIAGNOSIS — R059 Cough, unspecified: Secondary | ICD-10-CM

## 2015-01-18 DIAGNOSIS — F17200 Nicotine dependence, unspecified, uncomplicated: Secondary | ICD-10-CM

## 2015-01-18 DIAGNOSIS — R000 Tachycardia, unspecified: Secondary | ICD-10-CM

## 2015-01-18 DIAGNOSIS — G90522 Complex regional pain syndrome I of left lower limb: Secondary | ICD-10-CM

## 2015-01-18 MED ORDER — OMEPRAZOLE 40 MG PO CPDR: 40.0000 mg | DELAYED_RELEASE_CAPSULE | Freq: Every day | ORAL | Status: DC

## 2015-01-18 MED ORDER — RANITIDINE HCL 300 MG PO TABS: 300.0000 mg | ORAL_TABLET | Freq: Every day | ORAL | Status: DC | PRN

## 2015-01-18 MED ORDER — ALBUTEROL SULFATE HFA 108 (90 BASE) MCG/ACT IN AERS: 2.0000 | INHALATION_SPRAY | Freq: Four times a day (QID) | RESPIRATORY_TRACT | Status: DC | PRN

## 2015-01-18 MED ORDER — SULFAMETHOXAZOLE-TRIMETHOPRIM 800-160 MG PO TABS: 1.0000 | ORAL_TABLET | Freq: Two times a day (BID) | ORAL | Status: DC

## 2015-01-18 MED ORDER — HYDROCODONE-HOMATROPINE 5-1.5 MG/5ML PO SYRP: 5.0000 mL | ORAL_SOLUTION | Freq: Three times a day (TID) | ORAL | Status: DC | PRN

## 2015-01-18 MED ORDER — HYDROCODONE-ACETAMINOPHEN 7.5-325 MG PO TABS: 1.0000 | ORAL_TABLET | Freq: Two times a day (BID) | ORAL | Status: DC | PRN

## 2015-01-19 NOTE — Assessment & Plan Note (Signed)
Encouraged increased hydration and fiber in diet. Daily probiotics. If bowels not moving can use MOM 2 tbls po in 4 oz of warm prune juice by mouth every 2-3 days. If no results then repeat in 4 hours with  Dulcolax suppository pr, may repeat again in 4 more hours as needed. Seek care if symptoms worsen. Consider daily Miralax and/or Dulcolax if symptoms persist.  

## 2015-01-19 NOTE — Progress Notes (Signed)
Pearlena Ow  [id number] 1970/12/28 01/19/2015      Progress Note-Follow Up  Subjective  Chief Complaint  Chief Complaint  Patient presents with  . Follow-up    HPI  Patient is a 44 y.o. female in today for routine medical care. Patient is in today for follow-up. Has been struggling with increased congestion and cough this past week. Some possible low-grade subjective fevers. Has had and chest congestion. No chest pain or palpitations. Continues to struggle with left lower extremity pain. Pain is stable. Denies CP/palp/SOB/HA//GI or GU c/o. Taking meds as prescribed  Past Medical History  Diagnosis Date  . GERD (gastroesophageal reflux disease)   . Anemia   . Chicken pox   . Depression   . Obesity   . RSD (reflex sympathetic dystrophy)   . Tobacco use disorder 01/14/2014    1ppd  . Other and unspecified hyperlipidemia 01/14/2014  . Kidney stone 02/13/2014    Right, small  . Mid back pain on right side 02/13/2014  . Unspecified constipation 05/25/2014  . Preventative health care 07/05/2014    Past Surgical History  Procedure Laterality Date  . Cesarean section  2000  . Plate and screws in left arm  2005    humerus  . Wisdom tooth extraction  44 yrs old    Family History  Problem Relation Age of Onset  . Arthritis Mother     Living  . Hypertension Mother   . Thrombocytopenia Mother   . Hyperlipidemia Father   . Hypertension Father   . Thrombocytopenia Maternal Grandmother   . Stroke Maternal Grandmother   . Hypertension Maternal Grandmother   . Alcohol abuse Maternal Grandfather   . Heart disease Maternal Grandfather   . Parkinson's disease Maternal Grandfather   . Alcohol abuse Paternal Grandfather   . Cancer Paternal Grandfather   . Hypothyroidism Daughter   . Diabetes Daughter     type 1  . Dementia Paternal Grandmother     History   Social History  . Marital Status: Widowed    Spouse Name: N/A  . Number of Children: N/A  . Years of Education: N/A     Occupational History  . Not on file.   Social History Main Topics  . Smoking status: Current Every Day Smoker    Types: Cigarettes  . Smokeless tobacco: Not on file  . Alcohol Use: Yes     Comment: once a year.  . Drug Use: No  . Sexual Activity: Not on file     Comment: lives with son, 51 yo daughter, boyfriend, no dietary   Other Topics Concern  . Not on file   Social History Narrative    Current Outpatient Prescriptions on File Prior to Visit  Medication Sig Dispense Refill  . ALPRAZolam (XANAX) 0.25 MG tablet Take 1 tablet (0.25 mg total) by mouth 2 (two) times daily as needed for anxiety. 20 tablet 0  . aspirin 81 MG tablet Take 81 mg by mouth daily.    Marland Kitchen buPROPion (WELLBUTRIN SR) 150 MG 12 hr tablet one tablet by mouth twice daily 60 tablet 2  . carisoprodol (SOMA) 350 MG tablet Take 1 tablet (350 mg total) by mouth 4 (four) times daily as needed for muscle spasms. 30 tablet 0  . KRILL OIL PO Take 1 capsule by mouth daily.    Marland Kitchen lidocaine (LIDODERM) 5 % Place 3 patches onto the skin daily. Remove & Discard patch within 12 hours or as directed by MD Allergic to  Codeine  failed NSAIDs and steroids, no response Good response to these patches in past Already on Hydrocodone daily with incomplete response  Already on hydrocodone daily with inadequate response 90 patch 5  . Potassium 75 MG TABS Take 1 each by mouth at bedtime.    . pramipexole (MIRAPEX) 0.25 MG tablet Take 1 tablet (0.25 mg total) by mouth at bedtime as needed. 30 tablet 3   No current facility-administered medications on file prior to visit.    Allergies  Allergen Reactions  . Codeine Nausea Only    Review of Systems  Review of Systems  Constitutional: Negative for fever and malaise/fatigue.  HENT: Negative for congestion.   Eyes: Negative for discharge.  Respiratory: Negative for shortness of breath.   Cardiovascular: Negative for chest pain, palpitations and leg swelling.  Gastrointestinal:  Negative for nausea, abdominal pain and diarrhea.  Genitourinary: Negative for dysuria.  Musculoskeletal: Negative for falls.  Skin: Negative for rash.  Neurological: Negative for loss of consciousness and headaches.  Endo/Heme/Allergies: Negative for polydipsia.  Psychiatric/Behavioral: Negative for depression and suicidal ideas. The patient is not nervous/anxious and does not have insomnia.     Objective  BP 118/72 mmHg  Pulse 100  Temp(Src) 98.3 F (36.8 C) (Oral)  Resp 20  Ht 5' 6.5" (1.689 m)  Wt 277 lb (125.646 kg)  BMI 44.04 kg/m2  SpO2 96%  Physical Exam  Physical Exam  Constitutional: She is oriented to person, place, and time and well-developed, well-nourished, and in no distress. No distress.  HENT:  Head: Normocephalic and atraumatic.  Eyes: Conjunctivae are normal.  Neck: Neck supple. No thyromegaly present.  Cardiovascular: Normal rate, regular rhythm and normal heart sounds.   No murmur heard. Pulmonary/Chest: Effort normal and breath sounds normal. She has no wheezes.  Abdominal: She exhibits no distension and no mass.  Musculoskeletal: She exhibits no edema.  Lymphadenopathy:    She has no cervical adenopathy.  Neurological: She is alert and oriented to person, place, and time.  Skin: Skin is warm and dry. No rash noted. She is not diaphoretic.  Psychiatric: Memory, affect and judgment normal.    Lab Results  Component Value Date   TSH 2.308 01/13/2014   Lab Results  Component Value Date   WBC 6.6 04/23/2014   HGB 13.1 04/23/2014   HCT 38.7 04/23/2014   MCV 86.2 04/23/2014   PLT 286 04/23/2014   Lab Results  Component Value Date   CREATININE 0.73 04/23/2014   BUN 11 04/23/2014   NA 139 04/23/2014   K 4.0 04/23/2014   CL 108 04/23/2014   CO2 24 04/23/2014   Lab Results  Component Value Date   ALT 17 12/04/2013   AST 19 12/04/2013   ALKPHOS 56 12/04/2013   BILITOT 0.4 12/04/2013   Lab Results  Component Value Date   CHOL 165  01/13/2014   Lab Results  Component Value Date   HDL 40 01/13/2014   Lab Results  Component Value Date   LDLCALC 92 01/13/2014   Lab Results  Component Value Date   TRIG 164* 01/13/2014   Lab Results  Component Value Date   CHOLHDL 4.1 01/13/2014     Assessment & Plan  Constipation Encouraged increased hydration and fiber in diet. Daily probiotics. If bowels not moving can use MOM 2 tbls po in 4 oz of warm prune juice by mouth every 2-3 days. If no results then repeat in 4 hours with  Dulcolax suppository pr, may repeat again  in 4 more hours as needed. Seek care if symptoms worsen. Consider daily Miralax and/or Dulcolax if symptoms persist.    GERD (gastroesophageal reflux disease) Avoid offending foods, start probiotics. Do not eat large meals in late evening and consider raising head of bed.    Tobacco use disorder Encouraged complete cessation. Discussed need to quit as relates to risk of numerous cancers, cardiac and pulmonary disease as well as neurologic complications. Counseled for greater than 3 minutes   Hyperlipidemia, mixed Encouraged heart healthy diet, increase exercise, avoid trans fats, consider a krill oil cap daily   Acute bronchitis Encouraged increased rest and hydration, add probiotics, zinc such as Coldeze or Xicam. Treat fevers as needed. Started on Bactrim, mucinex   Complex regional pain syndrome I of lower limb May continue current meds prn.

## 2015-01-21 ENCOUNTER — Other Ambulatory Visit (INDEPENDENT_AMBULATORY_CARE_PROVIDER_SITE_OTHER): Payer: Medicare Other

## 2015-01-21 DIAGNOSIS — K2100 Gastro-esophageal reflux disease with esophagitis, without bleeding: Secondary | ICD-10-CM

## 2015-01-21 DIAGNOSIS — R739 Hyperglycemia, unspecified: Secondary | ICD-10-CM

## 2015-01-21 DIAGNOSIS — R3589 Other polyuria: Secondary | ICD-10-CM

## 2015-01-21 DIAGNOSIS — K219 Gastro-esophageal reflux disease without esophagitis: Secondary | ICD-10-CM | POA: Diagnosis not present

## 2015-01-21 DIAGNOSIS — R000 Tachycardia, unspecified: Secondary | ICD-10-CM

## 2015-01-21 DIAGNOSIS — E785 Hyperlipidemia, unspecified: Secondary | ICD-10-CM

## 2015-01-21 DIAGNOSIS — R631 Polydipsia: Secondary | ICD-10-CM

## 2015-01-21 DIAGNOSIS — R358 Other polyuria: Secondary | ICD-10-CM | POA: Diagnosis not present

## 2015-01-21 DIAGNOSIS — R0602 Shortness of breath: Secondary | ICD-10-CM

## 2015-01-21 LAB — COMPREHENSIVE METABOLIC PANEL
ALT: 19 U/L (ref 0–35)
AST: 18 U/L (ref 0–37)
Albumin: 3.9 g/dL (ref 3.5–5.2)
Alkaline Phosphatase: 59 U/L (ref 39–117)
BUN: 18 mg/dL (ref 6–23)
CALCIUM: 9.1 mg/dL (ref 8.4–10.5)
CHLORIDE: 107 meq/L (ref 96–112)
CO2: 24 meq/L (ref 19–32)
Creatinine, Ser: 0.82 mg/dL (ref 0.40–1.20)
GFR: 80.92 mL/min (ref >60.00)
Glucose, Bld: 93 mg/dL (ref 70–99)
POTASSIUM: 4 meq/L (ref 3.5–5.1)
SODIUM: 138 meq/L (ref 135–145)
TOTAL PROTEIN: 7.1 g/dL (ref 6.0–8.3)
Total Bilirubin: 0.3 mg/dL (ref 0.2–1.2)

## 2015-01-21 LAB — URINALYSIS, ROUTINE W REFLEX MICROSCOPIC
BILIRUBIN URINE: NEGATIVE
Ketones, ur: NEGATIVE
Leukocytes, UA: NEGATIVE
Nitrite: NEGATIVE
Specific Gravity, Urine: >=1.03 — AB (ref 1.000–1.030)
TOTAL PROTEIN, URINE-UPE24: NEGATIVE
URINE GLUCOSE: NEGATIVE
Urobilinogen, UA: 0.2 (ref 0.0–1.0)
pH: 5.5 (ref 5.0–8.0)

## 2015-01-21 LAB — LIPID PANEL
Cholesterol: 153 mg/dL (ref 0–200)
HDL: 37.6 mg/dL — ABNORMAL LOW (ref >39.00)
LDL Cholesterol: 103 mg/dL — ABNORMAL HIGH (ref 0–99)
NONHDL: 115.4
Total CHOL/HDL Ratio: 4
Triglycerides: 62 mg/dL (ref 0.0–149.0)
VLDL: 12.4 mg/dL (ref 0.0–40.0)

## 2015-01-21 LAB — CBC
HCT: 39.6 % (ref 36.0–46.0)
HEMOGLOBIN: 13.6 g/dL (ref 12.0–15.0)
MCHC: 34.3 g/dL (ref 30.0–36.0)
MCV: 86.4 fl (ref 78.0–100.0)
PLATELETS: 260 10*3/uL (ref 150.0–400.0)
RBC: 4.59 Mil/uL (ref 3.87–5.11)
RDW: 13.9 % (ref 11.5–15.5)
WBC: 6.3 10*3/uL (ref 4.0–10.5)

## 2015-01-21 LAB — TSH: TSH: 1.74 u[IU]/mL (ref 0.35–4.50)

## 2015-01-21 LAB — HEMOGLOBIN A1C: Hgb A1c MFr Bld: 5.6 % (ref 4.6–6.5)

## 2015-01-24 ENCOUNTER — Encounter: Payer: Self-pay | Admitting: Family Medicine

## 2015-01-24 DIAGNOSIS — J209 Acute bronchitis, unspecified: Secondary | ICD-10-CM | POA: Insufficient documentation

## 2015-01-24 NOTE — Assessment & Plan Note (Signed)
Encouraged increased rest and hydration, add probiotics, zinc such as Coldeze or Xicam. Treat fevers as needed. Started on Bactrim, mucinex

## 2015-01-24 NOTE — Assessment & Plan Note (Signed)
Encouraged complete cessation. Discussed need to quit as relates to risk of numerous cancers, cardiac and pulmonary disease as well as neurologic complications. Counseled for greater than 3 minutes 

## 2015-01-24 NOTE — Assessment & Plan Note (Signed)
Avoid offending foods, start probiotics. Do not eat large meals in late evening and consider raising head of bed.  

## 2015-01-24 NOTE — Assessment & Plan Note (Signed)
Encouraged heart healthy diet, increase exercise, avoid trans fats, consider a krill oil cap daily 

## 2015-01-24 NOTE — Assessment & Plan Note (Signed)
May continue current meds prn.

## 2015-02-17 ENCOUNTER — Telehealth: Payer: Self-pay | Admitting: Family Medicine

## 2015-02-17 DIAGNOSIS — IMO0002 Reserved for concepts with insufficient information to code with codable children: Secondary | ICD-10-CM

## 2015-02-17 DIAGNOSIS — M549 Dorsalgia, unspecified: Secondary | ICD-10-CM

## 2015-02-17 DIAGNOSIS — G9050 Complex regional pain syndrome I, unspecified: Secondary | ICD-10-CM

## 2015-02-17 NOTE — Telephone Encounter (Signed)
Relation to pt: self  Call back number: [phone]   Reason for call:  Pt requesting a refill HYDROcodone-acetaminophen (NORCO) 7.5-325 MG per tablet  60 tablets due to insurance

## 2015-02-18 NOTE — Telephone Encounter (Signed)
OK to refill as long as due

## 2015-02-18 NOTE — Telephone Encounter (Signed)
Last OV 01/21/15 Last refill #90 01/18/15 Advise if ok to fill at this time

## 2015-02-18 NOTE — Telephone Encounter (Signed)
Yes that is a 30 days supply OL to fill

## 2015-02-19 MED ORDER — HYDROCODONE-ACETAMINOPHEN 7.5-325 MG PO TABS: 1.0000 | ORAL_TABLET | Freq: Two times a day (BID) | ORAL | Status: DC | PRN

## 2015-02-19 NOTE — Telephone Encounter (Signed)
Printed prescription as instructed and on counter for signature.

## 2015-02-19 NOTE — Telephone Encounter (Signed)
Patient called back stating that she did refill but she was asking if forms for property taxes for disability were ready for pickup. Best # [phone]

## 2015-02-19 NOTE — Addendum Note (Signed)
Addended by: Nathalya Seller B on: 02/19/2015 07:35 AM   Modules accepted: Orders

## 2015-02-19 NOTE — Telephone Encounter (Signed)
Called the patient informed to pickup hardcopy at the front desk. (LEFT A DETAILED MESSAGE ON [phone])

## 2015-02-26 NOTE — Telephone Encounter (Signed)
Late entry:  Forms signed last week and informed pt. Copy sent for scanning. JG//CMA

## 2015-04-20 ENCOUNTER — Ambulatory Visit: Payer: Medicare Other | Admitting: Family Medicine

## 2015-04-22 ENCOUNTER — Telehealth: Payer: Self-pay | Admitting: Family Medicine

## 2015-04-22 NOTE — Telephone Encounter (Signed)
No charge this time, she has no job or significant income, if it happens again will need to charge

## 2015-04-22 NOTE — Telephone Encounter (Signed)
/  Pt was no show 04/20/15 4:30pm, follow up 15 appt, pt called 04/21/15 and rescheduled for 05/03/15, charge no show?

## 2015-05-03 ENCOUNTER — Ambulatory Visit: Payer: Medicare Other | Admitting: Family Medicine

## 2015-05-05 NOTE — Telephone Encounter (Signed)
No charge one last time, if happens again will have  To charge

## 2015-05-05 NOTE — Telephone Encounter (Signed)
Patient no show 05/03/15 [facility] 05/11/15 charge (due to below message)?

## 2015-05-11 ENCOUNTER — Ambulatory Visit (INDEPENDENT_AMBULATORY_CARE_PROVIDER_SITE_OTHER): Payer: BLUE CROSS/BLUE SHIELD | Admitting: Family Medicine

## 2015-05-11 ENCOUNTER — Encounter: Payer: Self-pay | Admitting: Family Medicine

## 2015-05-11 VITALS — BP 118/76 | HR 84 | Temp 98.0°F | Ht 67.0 in | Wt 280.2 lb

## 2015-05-11 DIAGNOSIS — K219 Gastro-esophageal reflux disease without esophagitis: Secondary | ICD-10-CM

## 2015-05-11 DIAGNOSIS — E669 Obesity, unspecified: Secondary | ICD-10-CM

## 2015-05-11 DIAGNOSIS — R3589 Other polyuria: Secondary | ICD-10-CM

## 2015-05-11 DIAGNOSIS — G9050 Complex regional pain syndrome I, unspecified: Secondary | ICD-10-CM | POA: Diagnosis not present

## 2015-05-11 DIAGNOSIS — R0602 Shortness of breath: Secondary | ICD-10-CM

## 2015-05-11 DIAGNOSIS — R739 Hyperglycemia, unspecified: Secondary | ICD-10-CM

## 2015-05-11 DIAGNOSIS — R358 Other polyuria: Secondary | ICD-10-CM

## 2015-05-11 DIAGNOSIS — F17200 Nicotine dependence, unspecified, uncomplicated: Secondary | ICD-10-CM

## 2015-05-11 DIAGNOSIS — M549 Dorsalgia, unspecified: Secondary | ICD-10-CM | POA: Diagnosis not present

## 2015-05-11 DIAGNOSIS — R000 Tachycardia, unspecified: Secondary | ICD-10-CM

## 2015-05-11 DIAGNOSIS — K2100 Gastro-esophageal reflux disease with esophagitis, without bleeding: Secondary | ICD-10-CM

## 2015-05-11 DIAGNOSIS — K5900 Constipation, unspecified: Secondary | ICD-10-CM

## 2015-05-11 DIAGNOSIS — E785 Hyperlipidemia, unspecified: Secondary | ICD-10-CM

## 2015-05-11 DIAGNOSIS — E782 Mixed hyperlipidemia: Secondary | ICD-10-CM

## 2015-05-11 DIAGNOSIS — Z720 Tobacco use: Secondary | ICD-10-CM

## 2015-05-11 DIAGNOSIS — R0789 Other chest pain: Secondary | ICD-10-CM

## 2015-05-11 DIAGNOSIS — R631 Polydipsia: Secondary | ICD-10-CM

## 2015-05-11 DIAGNOSIS — IMO0002 Reserved for concepts with insufficient information to code with codable children: Secondary | ICD-10-CM

## 2015-05-11 MED ORDER — HYDROCODONE-ACETAMINOPHEN 7.5-325 MG PO TABS: 1.0000 | ORAL_TABLET | Freq: Two times a day (BID) | ORAL | Status: DC | PRN

## 2015-05-11 MED ORDER — ROLLATOR MISC: Status: DC

## 2015-05-11 MED ORDER — RANITIDINE HCL 300 MG PO TABS: 300.0000 mg | ORAL_TABLET | Freq: Every day | ORAL | Status: DC | PRN

## 2015-05-11 MED ORDER — NITROGLYCERIN 0.4 MG SL SUBL
0.4000 mg | SUBLINGUAL_TABLET | SUBLINGUAL | Status: DC | PRN
Start: 2015-05-11 — End: 2016-08-11

## 2015-05-11 MED ORDER — OMEPRAZOLE 40 MG PO CPDR: 40.0000 mg | DELAYED_RELEASE_CAPSULE | Freq: Every day | ORAL | Status: DC

## 2015-05-11 NOTE — Patient Instructions (Addendum)
Mylanta as needed for chest pain/reflux Salon pas gel for muscle spasm/pain in shoulder  Benadryl cream after cleansing area with [place] to itchy area  Food Choices for Gastroesophageal Reflux Disease When you have gastroesophageal reflux disease (GERD), the foods you eat and your eating habits are very important. Choosing the right foods can help ease your discomfort.  WHAT GUIDELINES DO I NEED TO FOLLOW?   Choose fruits, vegetables, whole grains, and low-fat dairy products.   Choose low-fat meat, fish, and poultry.  Limit fats such as oils, salad dressings, butter, nuts, and avocado.   Keep a food diary. This helps you identify foods that cause symptoms.   Avoid foods that cause symptoms. These may be different for everyone.   Eat small meals often instead of 3 large meals a day.   Eat your meals slowly, in a place where you are relaxed.   Limit fried foods.   Cook foods using methods other than frying.   Avoid drinking alcohol.   Avoid drinking large amounts of liquids with your meals.   Avoid bending over or lying down until 2-3 hours after eating.  WHAT FOODS ARE NOT RECOMMENDED?  These are some foods and drinks that may make your symptoms worse: Vegetables Tomatoes. Tomato juice. Tomato and spaghetti sauce. Chili peppers. Onion and garlic. Horseradish. Fruits Oranges, grapefruit, and lemon (fruit and juice). Meats High-fat meats, fish, and poultry. This includes hot dogs, ribs, ham, sausage, salami, and bacon. Dairy Whole milk and chocolate milk. Sour cream. Cream. Butter. Ice cream. Cream cheese.  Drinks Coffee and tea. Bubbly (carbonated) drinks or energy drinks. Condiments Hot sauce. Barbecue sauce.  Sweets/Desserts Chocolate and cocoa. Donuts. Peppermint and spearmint. Fats and Oils High-fat foods. This includes [place] fries and potato chips. Other Vinegar. Strong spices. This includes black pepper, white pepper, red pepper,  cayenne, curry powder, cloves, ginger, and chili powder. The items listed above may not be a complete list of foods and drinks to avoid. Contact your dietitian for more information. Document Released: 04/02/2012 Document Revised: 10/07/2013 Document Reviewed: 08/06/2013 [facility] Patient Information 2015 [place], [place]. This information is not intended to replace advice given to you by your health care provider. Make sure you discuss any questions you have with your health care provider.

## 2015-05-11 NOTE — Progress Notes (Signed)
Pre visit review using our clinic review tool, if applicable. No additional management support is needed unless otherwise documented below in the visit note. 

## 2015-05-23 ENCOUNTER — Encounter: Payer: Self-pay | Admitting: Family Medicine

## 2015-05-23 DIAGNOSIS — R0789 Other chest pain: Secondary | ICD-10-CM | POA: Insufficient documentation

## 2015-05-23 NOTE — Assessment & Plan Note (Signed)
Encouraged increased hydration and fiber in diet. Daily probiotics. If bowels not moving can use MOM 2 tbls po in 4 oz of warm prune juice by mouth every 2-3 days. If no results then repeat in 4 hours with  Dulcolax suppository pr, may repeat again in 4 more hours as needed. Seek care if symptoms worsen. Consider daily Miralax and/or Dulcolax if symptoms persist.  

## 2015-05-23 NOTE — Assessment & Plan Note (Signed)
Encouraged complete cessation. Discussed need to quit as relates to risk of numerous cancers, cardiac and pulmonary disease as well as neurologic complications. Counseled for greater than 3 minutes 

## 2015-05-23 NOTE — Assessment & Plan Note (Signed)
hgba1c acceptable, minimize simple carbs. Increase exercise as tolerated.  

## 2015-05-23 NOTE — Assessment & Plan Note (Signed)
Encouraged heart healthy diet, increase exercise, avoid trans fats, consider a krill oil cap daily 

## 2015-05-23 NOTE — Assessment & Plan Note (Signed)
Likely anxiety and musculoskeletal but with her risk factors will refer to cardiology, EKG unremarkable.

## 2015-05-23 NOTE — Progress Notes (Signed)
Beverly Erickson  [id number] [birth year] 05/23/2015      Progress Note-Follow Up  Subjective  Chief Complaint  Chief Complaint  Patient presents with  . Follow-up    HPI  Patient is a 44 y.o. female in today for routine medical care. Patient isin today accompanied by her daughters She continues to struggle with chronic  To her reflex sympathetic dystrophy, Has numerous new concerns today as well. Acknowledges ongoing an. No suicidal ideation. She is having intermittent episodes of chest discomfort sometimes with and sometimes without shortness . Half of tablet of alprazolam is helpful. No recent illness. Denies palp/HA/congestion/fevers/GI or GU c/o. Taking meds as prescribed  Past Medical History  Diagnosis Date  . GERD (gastroesophageal reflux disease)   . Anemia   . Chicken pox   . Depression   . Obesity   . RSD (reflex sympathetic dystrophy)   . Tobacco use disorder 01/14/2014    1ppd  . Other and unspecified hyperlipidemia 01/14/2014  . Kidney stone 02/13/2014    Right, small  . Mid back pain on right side 02/13/2014  . Unspecified constipation 05/25/2014  . Preventative health care 07/05/2014  . Acute bronchitis 01/24/2015  . Atypical chest pain 05/23/2015    Past Surgical History  Procedure Laterality Date  . Cesarean section  2000  . Plate and screws in left arm  2005    humerus  . Wisdom tooth extraction  44 yrs old    Family History  Problem Relation Age of Onset  . Arthritis Mother     Living  . Hypertension Mother   . Thrombocytopenia Mother   . Hyperlipidemia Father   . Hypertension Father   . Thrombocytopenia Maternal Grandmother   . Stroke Maternal Grandmother   . Hypertension Maternal Grandmother   . Alcohol abuse Maternal Grandfather   . Heart disease Maternal Grandfather   . Parkinson's disease Maternal Grandfather   . Alcohol abuse Paternal Grandfather   . Cancer Paternal Grandfather   . Hypothyroidism Daughter   . Diabetes Daughter     type 1  .  Dementia Paternal Grandmother     History   Social History  . Marital Status: Widowed    Spouse Name: N/A  . Number of Children: N/A  . Years of Education: N/A   Occupational History  . Not on file.   Social History Main Topics  . Smoking status: Current Every Day Smoker    Types: Cigarettes  . Smokeless tobacco: Not on file  . Alcohol Use: Yes     Comment: once a year.  . Drug Use: No  . Sexual Activity: Not on file     Comment: lives with son, 11 yo daughter, boyfriend, no dietary   Other Topics Concern  . Not on file   Social History Narrative    Current Outpatient Prescriptions on File Prior to Visit  Medication Sig Dispense Refill  . albuterol (PROVENTIL HFA;VENTOLIN HFA) 108 (90 BASE) MCG/ACT inhaler Inhale 2 puffs into the lungs every 6 (six) hours as needed for wheezing or shortness of breath. 1 Inhaler 2  . ALPRAZolam (XANAX) 0.25 MG tablet Take 1 tablet (0.25 mg total) by mouth 2 (two) times daily as needed for anxiety. 20 tablet 0  . aspirin 81 MG tablet Take 81 mg by mouth daily.    Marland Kitchen buPROPion (WELLBUTRIN SR) 150 MG 12 hr tablet one tablet by mouth twice daily 60 tablet 2  . carisoprodol (SOMA) 350 MG tablet Take 1 tablet (350  mg total) by mouth 4 (four) times daily as needed for muscle spasms. 30 tablet 0  . HYDROcodone-homatropine (HYCODAN) 5-1.5 MG/5ML syrup Take 5 mLs by mouth every 8 (eight) hours as needed for cough. 180 mL 0  . KRILL OIL PO Take 1 capsule by mouth daily.    Marland Kitchen lidocaine (LIDODERM) 5 % Place 3 patches onto the skin daily. Remove & Discard patch within 12 hours or as directed by MD Allergic to Codeine  failed NSAIDs and steroids, no response Good response to these patches in past Already on Hydrocodone daily with incomplete response  Already on hydrocodone daily with inadequate response 90 patch 5  . Potassium 75 MG TABS Take 1 each by mouth at bedtime.    . pramipexole (MIRAPEX) 0.25 MG tablet Take 1 tablet (0.25 mg total) by mouth at  bedtime as needed. 30 tablet 3   No current facility-administered medications on file prior to visit.    Allergies  Allergen Reactions  . Codeine Nausea Only    Review of Systems  Review of Systems  Constitutional: Negative for fever and malaise/fatigue.  HENT: Negative for congestion.   Eyes: Negative for discharge.  Respiratory: Positive for shortness of breath.   Cardiovascular: Positive for chest pain. Negative for palpitations and leg swelling.  Gastrointestinal: Negative for nausea, abdominal pain and diarrhea.  Genitourinary: Negative for dysuria.  Musculoskeletal: Positive for back pain, joint pain and neck pain. Negative for falls.  Skin: Negative for rash.  Neurological: Negative for loss of consciousness and headaches.  Endo/Heme/Allergies: Negative for polydipsia.  Psychiatric/Behavioral: Positive for depression. Negative for suicidal ideas. The patient is nervous/anxious. The patient does not have insomnia.     Objective  BP 118/76 mmHg  Pulse 84  Temp(Src) 98 F (36.7 C) (Oral)  Ht 5\' 7"  (1.702 m)  Wt 280 lb 4 oz (127.121 kg)  BMI 43.88 kg/m2  SpO2 96%  Physical Exam  Physical Exam  Constitutional: She is oriented to person, place, and time and well-developed, well-nourished, and in no distress. No distress.  HENT:  Head: Normocephalic and atraumatic.  Eyes: Conjunctivae are normal.  Neck: Neck supple. No thyromegaly present.  Cardiovascular: Normal rate, regular rhythm and normal heart sounds.   No murmur heard. Pulmonary/Chest: Effort normal and breath sounds normal. She has no wheezes.  Abdominal: Soft. Bowel sounds are normal. She exhibits no distension and no mass.  Musculoskeletal: She exhibits no edema.  Lymphadenopathy:    She has no cervical adenopathy.  Neurological: She is alert and oriented to person, place, and time.  Skin: Skin is warm and dry. No rash noted. She is not diaphoretic.  Waxy, scaly lesion on back 1 cm, light brown    Psychiatric: Memory, affect and judgment normal.    Lab Results  Component Value Date   TSH 1.74 01/21/2015   Lab Results  Component Value Date   WBC 6.3 01/21/2015   HGB 13.6 01/21/2015   HCT 39.6 01/21/2015   MCV 86.4 01/21/2015   PLT 260.0 01/21/2015   Lab Results  Component Value Date   CREATININE 0.82 01/21/2015   BUN 18 01/21/2015   NA 138 01/21/2015   K 4.0 01/21/2015   CL 107 01/21/2015   CO2 24 01/21/2015   Lab Results  Component Value Date   ALT 19 01/21/2015   AST 18 01/21/2015   ALKPHOS 59 01/21/2015   BILITOT 0.3 01/21/2015   Lab Results  Component Value Date   CHOL 153 01/21/2015  Lab Results  Component Value Date   HDL 37.60* 01/21/2015   Lab Results  Component Value Date   LDLCALC 103* 01/21/2015   Lab Results  Component Value Date   TRIG 62.0 01/21/2015   Lab Results  Component Value Date   CHOLHDL 4 01/21/2015     Assessment & Plan  Tobacco use disorder Encouraged complete cessation. Discussed need to quit as relates to risk of numerous cancers, cardiac and pulmonary disease as well as neurologic complications. Counseled for greater than 3 minutes  Obesity Encouraged DASH diet, decrease po intake and increase exercise as tolerated. Needs 7-8 hours of sleep nightly. Avoid trans fats, eat small, frequent meals every 4-5 hours with lean proteins, complex carbs and healthy fats. Minimize simple carbs, GMO foods.  GERD (gastroesophageal reflux disease) Encouraged DASH diet, decrease po intake and increase exercise as tolerated. Needs 7-8 hours of sleep nightly. Avoid trans fats, eat small, frequent meals every 4-5 hours with lean proteins, complex carbs and healthy fats. Minimize simple carbs. May continue Ranitidine and Omeprazole  Constipation Encouraged increased hydration and fiber in diet. Daily probiotics. If bowels not moving can use MOM 2 tbls po in 4 oz of warm prune juice by mouth every 2-3 days. If no results then repeat in 4  hours with  Dulcolax suppository pr, may repeat again in 4 more hours as needed. Seek care if symptoms worsen. Consider daily Miralax and/or Dulcolax if symptoms persist.   Hyperglycemia hgba1c acceptable, minimize simple carbs. Increase exercise as tolerated.   Hyperlipidemia, mixed Encouraged heart healthy diet, increase exercise, avoid trans fats, consider a krill oil cap daily  Atypical chest pain Likely anxiety and musculoskeletal but with her risk factors will refer to cardiology, EKG unremarkable.

## 2015-05-23 NOTE — Assessment & Plan Note (Signed)
Encouraged DASH diet, decrease po intake and increase exercise as tolerated. Needs 7-8 hours of sleep nightly. Avoid trans fats, eat small, frequent meals every 4-5 hours with lean proteins, complex carbs and healthy fats. Minimize simple carbs. May continue Ranitidine and Omeprazole

## 2015-05-23 NOTE — Assessment & Plan Note (Signed)
Encouraged DASH diet, decrease po intake and increase exercise as tolerated. Needs 7-8 hours of sleep nightly. Avoid trans fats, eat small, frequent meals every 4-5 hours with lean proteins, complex carbs and healthy fats. Minimize simple carbs, GMO foods. 

## 2015-05-24 ENCOUNTER — Ambulatory Visit: Payer: Self-pay | Admitting: Cardiology

## 2015-06-20 ENCOUNTER — Other Ambulatory Visit: Payer: Self-pay | Admitting: Family Medicine

## 2015-06-20 DIAGNOSIS — IMO0002 Reserved for concepts with insufficient information to code with codable children: Secondary | ICD-10-CM

## 2015-06-20 DIAGNOSIS — G9050 Complex regional pain syndrome I, unspecified: Secondary | ICD-10-CM

## 2015-06-20 DIAGNOSIS — M549 Dorsalgia, unspecified: Secondary | ICD-10-CM

## 2015-06-22 MED ORDER — HYDROCODONE-ACETAMINOPHEN 7.5-325 MG PO TABS: 1.0000 | ORAL_TABLET | Freq: Two times a day (BID) | ORAL | Status: DC | PRN

## 2015-06-22 NOTE — Telephone Encounter (Signed)
Called the patient to inform hardcopy for Hydrocodone is ready for pickup at the front desk.

## 2015-06-29 DIAGNOSIS — Z7982 Long term (current) use of aspirin: Secondary | ICD-10-CM | POA: Diagnosis not present

## 2015-06-29 DIAGNOSIS — R079 Chest pain, unspecified: Secondary | ICD-10-CM | POA: Diagnosis not present

## 2015-06-29 DIAGNOSIS — F419 Anxiety disorder, unspecified: Secondary | ICD-10-CM | POA: Diagnosis not present

## 2015-06-29 DIAGNOSIS — N3000 Acute cystitis without hematuria: Secondary | ICD-10-CM | POA: Diagnosis not present

## 2015-06-29 DIAGNOSIS — F410 Panic disorder [episodic paroxysmal anxiety] without agoraphobia: Secondary | ICD-10-CM | POA: Diagnosis not present

## 2015-06-29 DIAGNOSIS — M549 Dorsalgia, unspecified: Secondary | ICD-10-CM | POA: Diagnosis not present

## 2015-06-29 DIAGNOSIS — R0789 Other chest pain: Secondary | ICD-10-CM | POA: Diagnosis not present

## 2015-06-29 DIAGNOSIS — R0602 Shortness of breath: Secondary | ICD-10-CM | POA: Diagnosis not present

## 2015-07-19 ENCOUNTER — Ambulatory Visit: Payer: Self-pay | Admitting: Cardiology

## 2015-08-11 ENCOUNTER — Encounter: Payer: Self-pay | Admitting: Cardiology

## 2015-08-11 ENCOUNTER — Telehealth: Payer: Self-pay | Admitting: Family Medicine

## 2015-08-11 ENCOUNTER — Ambulatory Visit (INDEPENDENT_AMBULATORY_CARE_PROVIDER_SITE_OTHER): Payer: BLUE CROSS/BLUE SHIELD | Admitting: Cardiology

## 2015-08-11 VITALS — BP 138/94 | HR 91 | Ht 67.0 in | Wt 275.1 lb

## 2015-08-11 DIAGNOSIS — F17200 Nicotine dependence, unspecified, uncomplicated: Secondary | ICD-10-CM | POA: Diagnosis not present

## 2015-08-11 DIAGNOSIS — R0789 Other chest pain: Secondary | ICD-10-CM

## 2015-08-11 DIAGNOSIS — R030 Elevated blood-pressure reading, without diagnosis of hypertension: Secondary | ICD-10-CM

## 2015-08-11 DIAGNOSIS — IMO0001 Reserved for inherently not codable concepts without codable children: Secondary | ICD-10-CM | POA: Insufficient documentation

## 2015-08-11 DIAGNOSIS — Z124 Encounter for screening for malignant neoplasm of cervix: Secondary | ICD-10-CM

## 2015-08-11 NOTE — Progress Notes (Signed)
HPI: 44 year old female with no prior cardiac history for evaluation of chest pain. Patient has had intermittent chest pain for 1-2 years. It is in the left chest area with occasional radiation to left shoulder. The pain is not pleuritic, related to food or exertional. It can last all day at times. There is associated dyspnea and diaphoresis but no nausea. She has some dyspnea on exertion but has limited mobility and requires a walker because of problems with her left lower extremity. No orthopnea or PND. Occasional mild pedal edema.  Current Outpatient Prescriptions  Medication Sig Dispense Refill  . albuterol (PROVENTIL HFA;VENTOLIN HFA) 108 (90 BASE) MCG/ACT inhaler Inhale 2 puffs into the lungs every 6 (six) hours as needed for wheezing or shortness of breath. 1 Inhaler 2  . ALPRAZolam (XANAX) 0.25 MG tablet Take 1 tablet (0.25 mg total) by mouth 2 (two) times daily as needed for anxiety. 20 tablet 0  . aspirin 81 MG tablet Take 81 mg by mouth daily.    Marland Kitchen buPROPion (WELLBUTRIN SR) 150 MG 12 hr tablet one tablet by mouth twice daily 60 tablet 2  . carisoprodol (SOMA) 350 MG tablet Take 1 tablet (350 mg total) by mouth 4 (four) times daily as needed for muscle spasms. 30 tablet 0  . HYDROcodone-acetaminophen (NORCO) 7.5-325 MG per tablet Take 1 tablet by mouth 2 (two) times daily as needed. 60 tablet 0  . KRILL OIL PO Take 1 capsule by mouth daily.    Marland Kitchen lidocaine (LIDODERM) 5 % Place 3 patches onto the skin daily. Remove & Discard patch within 12 hours or as directed by MD Allergic to Codeine  failed NSAIDs and steroids, no response Good response to these patches in past Already on Hydrocodone daily with incomplete response  Already on hydrocodone daily with inadequate response 90 patch 5  . Misc. Devices (ROLLATOR) MISC Patient with severe pain and weakness in left leg Reflex Sympathetic Dystrophy 1 each 0  . nitroGLYCERIN (NITROSTAT) 0.4 MG SL tablet Place 1 tablet (0.4 mg total)  under the tongue every 5 (five) minutes as needed for chest pain. 25 tablet 1  . omeprazole (PRILOSEC) 40 MG capsule Take 1 capsule (40 mg total) by mouth daily. 90 capsule 1  . Potassium 75 MG TABS Take 1 each by mouth at bedtime.    . pramipexole (MIRAPEX) 0.25 MG tablet Take 1 tablet (0.25 mg total) by mouth at bedtime as needed. 30 tablet 3  . ranitidine (ZANTAC) 300 MG tablet Take 1 tablet (300 mg total) by mouth daily as needed for heartburn. 90 tablet 1   No current facility-administered medications for this visit.    Allergies  Allergen Reactions  . Codeine Nausea Only     Past Medical History  Diagnosis Date  . GERD (gastroesophageal reflux disease)   . Anemia   . Chicken pox   . Depression   . Obesity   . RSD (reflex sympathetic dystrophy)   . Tobacco use disorder 01/14/2014    1ppd  . Other and unspecified hyperlipidemia 01/14/2014  . Kidney stone 02/13/2014    Right, small  . Unspecified constipation 05/25/2014  . Preventative health care 07/05/2014    Past Surgical History  Procedure Laterality Date  . Cesarean section  2000  . Plate and screws in left arm  2005    humerus  . Wisdom tooth extraction  44 yrs old    Social History   Social History  . Marital Status: Widowed  Spouse Name: N/A  . Number of Children: 3  . Years of Education: N/A   Occupational History  . Not on file.   Social History Main Topics  . Smoking status: Current Every Day Smoker    Types: Cigarettes  . Smokeless tobacco: Not on file  . Alcohol Use: Yes     Comment: once a year.  . Drug Use: No  . Sexual Activity: Not on file     Comment: lives with son, 61 yo daughter, boyfriend, no dietary   Other Topics Concern  . Not on file   Social History Narrative    Family History  Problem Relation Age of Onset  . Arthritis Mother     Living  . Hypertension Mother   . Thrombocytopenia Mother   . Hyperlipidemia Father   . Hypertension Father   . Thrombocytopenia Maternal  Grandmother   . Stroke Maternal Grandmother   . Hypertension Maternal Grandmother   . Alcohol abuse Maternal Grandfather   . Heart disease Maternal Grandfather   . Parkinson's disease Maternal Grandfather   . Alcohol abuse Paternal Grandfather   . Cancer Paternal Grandfather   . Hypothyroidism Daughter   . Diabetes Daughter     type 1  . Dementia Paternal Grandmother     ROS: Pain in left lower extremity from reflex sympathetic dystrophy but no fevers or chills, productive cough, hemoptysis, dysphasia, odynophagia, melena, hematochezia, dysuria, hematuria, rash, seizure activity, orthopnea, PND, claudication. Remaining systems are negative.  Physical Exam:   Blood pressure 138/94, pulse 91, height 5\' 7"  (1.702 m), weight 124.794 kg (275 lb 1.9 oz).  General:  Well developed/obese in NAD Skin warm/dry Patient not depressed, flat affect No peripheral clubbing Back-normal HEENT-normal/normal eyelids Neck supple/normal carotid upstroke bilaterally; no bruits; no JVD; no thyromegaly chest - CTA/ normal expansion CV - RRR/normal S1 and S2; no murmurs, rubs or gallops;  PMI nondisplaced Abdomen -NT/ND, no HSM, no mass, + bowel sounds, no bruit Ext-no edema, chords, 2+ DP Neuro-grossly nonfocal  ECG Normal sinus rhythm at a rate of 91. No significant ST changes.

## 2015-08-11 NOTE — Telephone Encounter (Signed)
Caller name: [place]   Relationship to patient: Self  Can be reached: [phone]  Pharmacy: [facility], [place]  Reason for call: pt is requesting a refill on her ALPRAZolam Rx.

## 2015-08-11 NOTE — Assessment & Plan Note (Signed)
Blood pressure mildly elevated. She will follow this and follow-up with primary care for further management if it remains high.

## 2015-08-11 NOTE — Assessment & Plan Note (Signed)
Patient symptoms are extremely atypical. They can last all day at times. Today they are reproduced with palpating her left chest and moving her left upper extremity. There are no electrocardiographic changes. Symptoms likely musculoskeletal. I do not think we should pursue further ischemia evaluation.

## 2015-08-11 NOTE — Assessment & Plan Note (Signed)
Patient counseled on discontinuing. 

## 2015-08-11 NOTE — Patient Instructions (Signed)
Your physician recommends that you schedule a follow-up appointment in: AS NEEDED  

## 2015-08-12 ENCOUNTER — Ambulatory Visit: Payer: Medicare Other | Admitting: Family Medicine

## 2015-08-12 NOTE — Telephone Encounter (Signed)
Ok to refill alprazolam same strength, same sig, same # and 1 rf

## 2015-08-12 NOTE — Telephone Encounter (Signed)
Requesting: Alprazolam Contract    NONE UDS   NONE Last OV    05/11/15 Last Refill    06/30/15   #20 with O refills  Please Advise

## 2015-08-13 MED ORDER — ALPRAZOLAM 0.25 MG PO TABS: 0.2500 mg | ORAL_TABLET | Freq: Two times a day (BID) | ORAL | Status: DC | PRN

## 2015-08-13 NOTE — Telephone Encounter (Signed)
Printed and on counter for signature. Called the patient informed have faxed to [facility] (left msg)

## 2015-08-13 NOTE — Addendum Note (Signed)
Addended by: Bell Seller B on: 08/13/2015 07:44 AM   Modules accepted: Orders

## 2015-08-24 ENCOUNTER — Ambulatory Visit (INDEPENDENT_AMBULATORY_CARE_PROVIDER_SITE_OTHER): Payer: BLUE CROSS/BLUE SHIELD | Admitting: Family Medicine

## 2015-08-24 ENCOUNTER — Encounter: Payer: Self-pay | Admitting: Family Medicine

## 2015-08-24 VITALS — BP 138/88 | HR 81 | Temp 98.1°F | Ht 67.0 in | Wt 274.2 lb

## 2015-08-24 DIAGNOSIS — K219 Gastro-esophageal reflux disease without esophagitis: Secondary | ICD-10-CM

## 2015-08-24 DIAGNOSIS — K2100 Gastro-esophageal reflux disease with esophagitis, without bleeding: Secondary | ICD-10-CM

## 2015-08-24 DIAGNOSIS — R0602 Shortness of breath: Secondary | ICD-10-CM | POA: Diagnosis not present

## 2015-08-24 DIAGNOSIS — M549 Dorsalgia, unspecified: Secondary | ICD-10-CM

## 2015-08-24 DIAGNOSIS — E669 Obesity, unspecified: Secondary | ICD-10-CM

## 2015-08-24 DIAGNOSIS — Z720 Tobacco use: Secondary | ICD-10-CM

## 2015-08-24 DIAGNOSIS — F17200 Nicotine dependence, unspecified, uncomplicated: Secondary | ICD-10-CM

## 2015-08-24 DIAGNOSIS — R358 Other polyuria: Secondary | ICD-10-CM | POA: Diagnosis not present

## 2015-08-24 DIAGNOSIS — IMO0001 Reserved for inherently not codable concepts without codable children: Secondary | ICD-10-CM

## 2015-08-24 DIAGNOSIS — G9050 Complex regional pain syndrome I, unspecified: Secondary | ICD-10-CM

## 2015-08-24 DIAGNOSIS — R3589 Other polyuria: Secondary | ICD-10-CM

## 2015-08-24 DIAGNOSIS — R000 Tachycardia, unspecified: Secondary | ICD-10-CM

## 2015-08-24 DIAGNOSIS — R030 Elevated blood-pressure reading, without diagnosis of hypertension: Secondary | ICD-10-CM

## 2015-08-24 DIAGNOSIS — F418 Other specified anxiety disorders: Secondary | ICD-10-CM

## 2015-08-24 DIAGNOSIS — IMO0002 Reserved for concepts with insufficient information to code with codable children: Secondary | ICD-10-CM

## 2015-08-24 DIAGNOSIS — R631 Polydipsia: Secondary | ICD-10-CM

## 2015-08-24 DIAGNOSIS — E785 Hyperlipidemia, unspecified: Secondary | ICD-10-CM

## 2015-08-24 MED ORDER — PRAMIPEXOLE DIHYDROCHLORIDE 1 MG PO TABS
1.0000 mg | ORAL_TABLET | Freq: Every day | ORAL | Status: DC
Start: 2015-08-24 — End: 2016-08-11

## 2015-08-24 MED ORDER — BUPROPION HCL ER (SR) 150 MG PO TB12: ORAL_TABLET | ORAL | Status: DC

## 2015-08-24 MED ORDER — OMEPRAZOLE 40 MG PO CPDR: 40.0000 mg | DELAYED_RELEASE_CAPSULE | Freq: Every day | ORAL | Status: DC

## 2015-08-24 MED ORDER — HYDROCODONE-ACETAMINOPHEN 7.5-325 MG PO TABS: 2.0000 | ORAL_TABLET | Freq: Two times a day (BID) | ORAL | Status: DC | PRN

## 2015-08-24 NOTE — Patient Instructions (Signed)

## 2015-08-24 NOTE — Progress Notes (Signed)
Pre visit review using our clinic review tool, if applicable. No additional management support is needed unless otherwise documented below in the visit note. 

## 2015-08-30 ENCOUNTER — Encounter: Payer: Self-pay | Admitting: Family Medicine

## 2015-08-30 DIAGNOSIS — F418 Other specified anxiety disorders: Secondary | ICD-10-CM

## 2015-08-30 HISTORY — DX: Other specified anxiety disorders: F41.8

## 2015-08-30 NOTE — Assessment & Plan Note (Signed)
Well controlled. Encouraged heart healthy diet such as the DASH diet and exercise as tolerated.  

## 2015-08-30 NOTE — Assessment & Plan Note (Signed)
Is very stressed regarding her daughter being on Crystal Meth. She was helping to raise her grandson from her daughter but he has been placed with other family now. Will increase Wellbutrin to bid and may use Alprazolam sparingly

## 2015-08-30 NOTE — Assessment & Plan Note (Signed)
Encouraged DASH diet, decrease po intake and increase exercise as tolerated. Needs 7-8 hours of sleep nightly. Avoid trans fats, eat small, frequent meals every 4-5 hours with lean proteins, complex carbs and healthy fats. Minimize simple carbs 

## 2015-08-30 NOTE — Assessment & Plan Note (Signed)
Avoid offending foods, start probiotics. Do not eat large meals in late evening and consider raising head of bed.  

## 2015-08-30 NOTE — Assessment & Plan Note (Signed)
Encouraged complete cessation. Discussed need to quit as relates to risk of numerous cancers, cardiac and pulmonary disease as well as neurologic complications. Counseled for greater than 3 minutes 

## 2015-08-30 NOTE — Progress Notes (Signed)
Subjective:    Patient ID: Beverly Erickson, female    DOB: 1971-08-07, 44 y.o.   MRN: [id number]  Chief Complaint  Patient presents with  . Follow-up    HPI Patient is in today for follow-up. She is very distraught. Her daughter is currently struggling with drug addiction and she has lost custody of her grand son. She is tearful and anxious. She is requesting an increase in her Wellbutrin. She is smoking more and struggling with increased pain as well. Denies CP/palp/SOB/HA/congestion/fevers/GI or GU c/o. Taking meds as prescribed  Past Medical History  Diagnosis Date  . GERD (gastroesophageal reflux disease)   . Anemia   . Chicken pox   . Depression   . Obesity   . RSD (reflex sympathetic dystrophy)   . Tobacco use disorder 01/14/2014    1ppd  . Other and unspecified hyperlipidemia 01/14/2014  . Kidney stone 02/13/2014    Right, small  . Unspecified constipation 05/25/2014  . Preventative health care 07/05/2014  . Depression with anxiety 08/30/2015    Past Surgical History  Procedure Laterality Date  . Cesarean section  2000  . Plate and screws in left arm  2005    humerus  . Wisdom tooth extraction  44 yrs old    Family History  Problem Relation Age of Onset  . Arthritis Mother     Living  . Hypertension Mother   . Thrombocytopenia Mother   . Hyperlipidemia Father   . Hypertension Father   . Thrombocytopenia Maternal Grandmother   . Stroke Maternal Grandmother   . Hypertension Maternal Grandmother   . Alcohol abuse Maternal Grandfather   . Heart disease Maternal Grandfather   . Parkinson's disease Maternal Grandfather   . Alcohol abuse Paternal Grandfather   . Cancer Paternal Grandfather   . Hypothyroidism Daughter   . Diabetes Daughter     type 1  . Dementia Paternal Grandmother     Social History   Social History  . Marital Status: Widowed    Spouse Name: N/A  . Number of Children: 3  . Years of Education: N/A   Occupational History  . Not on file.     Social History Main Topics  . Smoking status: Current Every Day Smoker    Types: Cigarettes  . Smokeless tobacco: Not on file  . Alcohol Use: Yes     Comment: once a year.  . Drug Use: No  . Sexual Activity: Not on file     Comment: lives with son, 66 yo daughter, boyfriend, no dietary   Other Topics Concern  . Not on file   Social History Narrative    Outpatient Prescriptions Prior to Visit  Medication Sig Dispense Refill  . albuterol (PROVENTIL HFA;VENTOLIN HFA) 108 (90 BASE) MCG/ACT inhaler Inhale 2 puffs into the lungs every 6 (six) hours as needed for wheezing or shortness of breath. 1 Inhaler 2  . ALPRAZolam (XANAX) 0.25 MG tablet Take 1 tablet (0.25 mg total) by mouth 2 (two) times daily as needed for anxiety. 20 tablet 1  . aspirin 81 MG tablet Take 81 mg by mouth daily.    . carisoprodol (SOMA) 350 MG tablet Take 1 tablet (350 mg total) by mouth 4 (four) times daily as needed for muscle spasms. 30 tablet 0  . KRILL OIL PO Take 1 capsule by mouth daily.    . Misc. Devices (ROLLATOR) MISC Patient with severe pain and weakness in left leg Reflex Sympathetic Dystrophy 1 each 0  .  nitroGLYCERIN (NITROSTAT) 0.4 MG SL tablet Place 1 tablet (0.4 mg total) under the tongue every 5 (five) minutes as needed for chest pain. 25 tablet 1  . Potassium 75 MG TABS Take 1 each by mouth at bedtime.    . ranitidine (ZANTAC) 300 MG tablet Take 1 tablet (300 mg total) by mouth daily as needed for heartburn. 90 tablet 1  . HYDROcodone-acetaminophen (NORCO) 7.5-325 MG per tablet Take 1 tablet by mouth 2 (two) times daily as needed. 60 tablet 0  . omeprazole (PRILOSEC) 40 MG capsule Take 1 capsule (40 mg total) by mouth daily. 90 capsule 1  . lidocaine (LIDODERM) 5 % Place 3 patches onto the skin daily. Remove & Discard patch within 12 hours or as directed by MD Allergic to Codeine  failed NSAIDs and steroids, no response Good response to these patches in past Already on Hydrocodone daily with  incomplete response  Already on hydrocodone daily with inadequate response (Patient not taking: Reported on 08/24/2015) 90 patch 5  . buPROPion (WELLBUTRIN SR) 150 MG 12 hr tablet one tablet by mouth twice daily (Patient not taking: Reported on 08/24/2015) 60 tablet 2  . pramipexole (MIRAPEX) 0.25 MG tablet Take 1 tablet (0.25 mg total) by mouth at bedtime as needed. (Patient not taking: Reported on 08/24/2015) 30 tablet 3   No facility-administered medications prior to visit.    Allergies  Allergen Reactions  . Codeine Nausea Only    Review of Systems  Constitutional: Negative for fever and malaise/fatigue.  HENT: Negative for congestion.   Eyes: Negative for discharge.  Respiratory: Negative for shortness of breath.   Cardiovascular: Negative for chest pain, palpitations and leg swelling.  Gastrointestinal: Negative for nausea and abdominal pain.  Genitourinary: Negative for dysuria.  Musculoskeletal: Positive for back pain and joint pain. Negative for falls.  Skin: Negative for rash.  Neurological: Negative for loss of consciousness and headaches.  Endo/Heme/Allergies: Negative for environmental allergies.  Psychiatric/Behavioral: Positive for depression. The patient is nervous/anxious.        Objective:    Physical Exam  Constitutional: She is oriented to person, place, and time. She appears well-developed and well-nourished. No distress.  HENT:  Head: Normocephalic and atraumatic.  Nose: Nose normal.  Eyes: Right eye exhibits no discharge. Left eye exhibits no discharge.  Neck: Normal range of motion. Neck supple.  Cardiovascular: Normal rate and regular rhythm.   No murmur heard. Pulmonary/Chest: Effort normal and breath sounds normal.  Abdominal: Soft. Bowel sounds are normal. There is no tenderness.  Musculoskeletal: She exhibits no edema.  Neurological: She is alert and oriented to person, place, and time.  Skin: Skin is warm and dry.  Psychiatric: She has a normal  mood and affect.  Nursing note and vitals reviewed.   BP 138/88 mmHg  Pulse 81  Temp(Src) 98.1 F (36.7 C) (Oral)  Ht 5\' 7"  (1.702 m)  Wt 274 lb 4 oz (124.399 kg)  BMI 42.94 kg/m2  SpO2 99% Wt Readings from Last 3 Encounters:  08/24/15 274 lb 4 oz (124.399 kg)  08/11/15 275 lb 1.9 oz (124.794 kg)  05/11/15 280 lb 4 oz (127.121 kg)     Lab Results  Component Value Date   WBC 6.3 01/21/2015   HGB 13.6 01/21/2015   HCT 39.6 01/21/2015   PLT 260.0 01/21/2015   GLUCOSE 93 01/21/2015   CHOL 153 01/21/2015   TRIG 62.0 01/21/2015   HDL 37.60* 01/21/2015   LDLCALC 103* 01/21/2015   ALT 19 01/21/2015  AST 18 01/21/2015   NA 138 01/21/2015   K 4.0 01/21/2015   CL 107 01/21/2015   CREATININE 0.82 01/21/2015   BUN 18 01/21/2015   CO2 24 01/21/2015   TSH 1.74 01/21/2015   HGBA1C 5.6 01/21/2015    Lab Results  Component Value Date   TSH 1.74 01/21/2015   Lab Results  Component Value Date   WBC 6.3 01/21/2015   HGB 13.6 01/21/2015   HCT 39.6 01/21/2015   MCV 86.4 01/21/2015   PLT 260.0 01/21/2015   Lab Results  Component Value Date   NA 138 01/21/2015   K 4.0 01/21/2015   CO2 24 01/21/2015   GLUCOSE 93 01/21/2015   BUN 18 01/21/2015   CREATININE 0.82 01/21/2015   BILITOT 0.3 01/21/2015   ALKPHOS 59 01/21/2015   AST 18 01/21/2015   ALT 19 01/21/2015   PROT 7.1 01/21/2015   ALBUMIN 3.9 01/21/2015   CALCIUM 9.1 01/21/2015   GFR 80.92 01/21/2015   Lab Results  Component Value Date   CHOL 153 01/21/2015   Lab Results  Component Value Date   HDL 37.60* 01/21/2015   Lab Results  Component Value Date   LDLCALC 103* 01/21/2015   Lab Results  Component Value Date   TRIG 62.0 01/21/2015   Lab Results  Component Value Date   CHOLHDL 4 01/21/2015   Lab Results  Component Value Date   HGBA1C 5.6 01/21/2015       Assessment & Plan:   Problem List Items Addressed This Visit    Depression with anxiety    Is very stressed regarding her daughter  being on [place]. She was helping to raise her grandson from her daughter but he has been placed with other family now. Will increase Wellbutrin to bid and may use Alprazolam sparingly      Elevated blood pressure    Well controlled. Encouraged heart healthy diet such as the DASH diet and exercise as tolerated.       GERD (gastroesophageal reflux disease) - Primary    Avoid offending foods, start probiotics. Do not eat large meals in late evening and consider raising head of bed.       Relevant Medications   omeprazole (PRILOSEC) 40 MG capsule   Mid back pain on right side   Relevant Medications   HYDROcodone-acetaminophen (NORCO) 7.5-325 MG tablet   Obesity    Encouraged DASH diet, decrease po intake and increase exercise as tolerated. Needs 7-8 hours of sleep nightly. Avoid trans fats, eat small, frequent meals every 4-5 hours with lean proteins, complex carbs and healthy fats. Minimize simple carbs      RSD (reflex sympathetic dystrophy)    Follows with pain management. stable      Relevant Medications   buPROPion (WELLBUTRIN SR) 150 MG 12 hr tablet   HYDROcodone-acetaminophen (NORCO) 7.5-325 MG tablet   pramipexole (MIRAPEX) 1 MG tablet   Tobacco use disorder    Encouraged complete cessation. Discussed need to quit as relates to risk of numerous cancers, cardiac and pulmonary disease as well as neurologic complications. Counseled for greater than 3 minutes       Other Visit Diagnoses    SOB (shortness of breath)        Relevant Medications    omeprazole (PRILOSEC) 40 MG capsule    Polyuria        Relevant Medications    omeprazole (PRILOSEC) 40 MG capsule    Polydipsia  Relevant Medications    omeprazole (PRILOSEC) 40 MG capsule    Tachycardia        Relevant Medications    omeprazole (PRILOSEC) 40 MG capsule    Hyperlipidemia        Relevant Medications    omeprazole (PRILOSEC) 40 MG capsule    Tobacco abuse disorder        Relevant Medications     buPROPion (WELLBUTRIN SR) 150 MG 12 hr tablet    Complex regional pain syndrome        Relevant Medications    buPROPion (WELLBUTRIN SR) 150 MG 12 hr tablet    HYDROcodone-acetaminophen (NORCO) 7.5-325 MG tablet    pramipexole (MIRAPEX) 1 MG tablet       I have discontinued Ms. Schneider's pramipexole. I have also changed her HYDROcodone-acetaminophen. Additionally, I am having her start on pramipexole. Lastly, I am having her maintain her carisoprodol, Potassium, aspirin, KRILL OIL PO, lidocaine, albuterol, ROLLATOR, ranitidine, nitroGLYCERIN, ALPRAZolam, omeprazole, and buPROPion.  Meds ordered this encounter  Medications  . omeprazole (PRILOSEC) 40 MG capsule    Sig: Take 1 capsule (40 mg total) by mouth daily.    Dispense:  90 capsule    Refill:  1    DISCONTINUE ALL PREVIOUS REFILLS FOR THIS MEDICATION  . buPROPion (WELLBUTRIN SR) 150 MG 12 hr tablet    Sig: one tablet by mouth twice daily    Dispense:  60 tablet    Refill:  2  . HYDROcodone-acetaminophen (NORCO) 7.5-325 MG tablet    Sig: Take 2 tablets by mouth 2 (two) times daily as needed.    Dispense:  60 tablet    Refill:  0  . pramipexole (MIRAPEX) 1 MG tablet    Sig: Take 1 tablet (1 mg total) by mouth at bedtime.    Dispense:  30 tablet    Refill:  2     Penni Homans, MD

## 2015-08-30 NOTE — Assessment & Plan Note (Signed)
Follows with pain management. stable

## 2015-09-24 ENCOUNTER — Telehealth: Payer: Self-pay | Admitting: Family Medicine

## 2015-09-24 NOTE — Telephone Encounter (Signed)
Left message for patient to call back about Flu Shot

## 2015-10-20 ENCOUNTER — Ambulatory Visit (INDEPENDENT_AMBULATORY_CARE_PROVIDER_SITE_OTHER): Payer: BLUE CROSS/BLUE SHIELD | Admitting: Physician Assistant

## 2015-10-20 ENCOUNTER — Encounter: Payer: Self-pay | Admitting: Physician Assistant

## 2015-10-20 VITALS — BP 104/62 | HR 95 | Temp 98.4°F | Resp 18 | Ht 67.0 in | Wt 278.0 lb

## 2015-10-20 DIAGNOSIS — B9689 Other specified bacterial agents as the cause of diseases classified elsewhere: Secondary | ICD-10-CM

## 2015-10-20 DIAGNOSIS — J019 Acute sinusitis, unspecified: Secondary | ICD-10-CM | POA: Diagnosis not present

## 2015-10-20 MED ORDER — BENZONATATE 100 MG PO CAPS: 100.0000 mg | ORAL_CAPSULE | Freq: Two times a day (BID) | ORAL | Status: DC | PRN

## 2015-10-20 MED ORDER — AMOXICILLIN-POT CLAVULANATE 875-125 MG PO TABS
1.0000 | ORAL_TABLET | Freq: Two times a day (BID) | ORAL | Status: DC
Start: 2015-10-20 — End: 2016-04-14

## 2015-10-20 NOTE — Progress Notes (Signed)
Pre visit review using our clinic review tool, if applicable. No additional management support is needed unless otherwise documented below in the visit note. 

## 2015-10-20 NOTE — Assessment & Plan Note (Signed)
Rx Augmentin.  Increase fluids.  Rest.  Saline nasal spray.  Probiotic.  Mucinex as directed.  Humidifier in bedroom. Tessalon per orders.  Call or return to clinic if symptoms are not improving.

## 2015-10-20 NOTE — Progress Notes (Signed)
Patient presents to clinic today c/o 1 week of worsening head congestion with sinus pain and bilateral ear pain with a cough that is sometimes productive of clear sputum. Denies fever, chills, Denies recent travel. Endorses some sick contacts in the past few weeks.  Past Medical History  Diagnosis Date  . GERD (gastroesophageal reflux disease)   . Anemia   . Chicken pox   . Depression   . Obesity   . RSD (reflex sympathetic dystrophy)   . Tobacco use disorder 01/14/2014    1ppd  . Other and unspecified hyperlipidemia 01/14/2014  . Kidney stone 02/13/2014    Right, small  . Unspecified constipation 05/25/2014  . Preventative health care 07/05/2014  . Depression with anxiety 08/30/2015    Current Outpatient Prescriptions on File Prior to Visit  Medication Sig Dispense Refill  . albuterol (PROVENTIL HFA;VENTOLIN HFA) 108 (90 BASE) MCG/ACT inhaler Inhale 2 puffs into the lungs every 6 (six) hours as needed for wheezing or shortness of breath. 1 Inhaler 2  . ALPRAZolam (XANAX) 0.25 MG tablet Take 1 tablet (0.25 mg total) by mouth 2 (two) times daily as needed for anxiety. 20 tablet 1  . aspirin 81 MG tablet Take 81 mg by mouth daily.    Marland Kitchen buPROPion (WELLBUTRIN SR) 150 MG 12 hr tablet one tablet by mouth twice daily 60 tablet 2  . carisoprodol (SOMA) 350 MG tablet Take 1 tablet (350 mg total) by mouth 4 (four) times daily as needed for muscle spasms. 30 tablet 0  . HYDROcodone-acetaminophen (NORCO) 7.5-325 MG tablet Take 2 tablets by mouth 2 (two) times daily as needed. 60 tablet 0  . KRILL OIL PO Take 1 capsule by mouth daily.    Marland Kitchen lidocaine (LIDODERM) 5 % Place 3 patches onto the skin daily. Remove & Discard patch within 12 hours or as directed by MD Allergic to Codeine  failed NSAIDs and steroids, no response Good response to these patches in past Already on Hydrocodone daily with incomplete response  Already on hydrocodone daily with inadequate response 90 patch 5  . Misc. Devices  (ROLLATOR) MISC Patient with severe pain and weakness in left leg Reflex Sympathetic Dystrophy 1 each 0  . nitroGLYCERIN (NITROSTAT) 0.4 MG SL tablet Place 1 tablet (0.4 mg total) under the tongue every 5 (five) minutes as needed for chest pain. 25 tablet 1  . omeprazole (PRILOSEC) 40 MG capsule Take 1 capsule (40 mg total) by mouth daily. 90 capsule 1  . Potassium 75 MG TABS Take 1 each by mouth at bedtime.    . pramipexole (MIRAPEX) 1 MG tablet Take 1 tablet (1 mg total) by mouth at bedtime. 30 tablet 2  . ranitidine (ZANTAC) 300 MG tablet Take 1 tablet (300 mg total) by mouth daily as needed for heartburn. 90 tablet 1   No current facility-administered medications on file prior to visit.    Allergies  Allergen Reactions  . Codeine Nausea Only    Family History  Problem Relation Age of Onset  . Arthritis Mother     Living  . Hypertension Mother   . Thrombocytopenia Mother   . Hyperlipidemia Father   . Hypertension Father   . Thrombocytopenia Maternal Grandmother   . Stroke Maternal Grandmother   . Hypertension Maternal Grandmother   . Alcohol abuse Maternal Grandfather   . Heart disease Maternal Grandfather   . Parkinson's disease Maternal Grandfather   . Alcohol abuse Paternal Grandfather   . Cancer Paternal Grandfather   .  Hypothyroidism Daughter   . Diabetes Daughter     type 1  . Dementia Paternal Grandmother     Social History   Social History  . Marital Status: Widowed    Spouse Name: N/A  . Number of Children: 3  . Years of Education: N/A   Social History Main Topics  . Smoking status: Current Every Day Smoker    Types: Cigarettes  . Smokeless tobacco: None  . Alcohol Use: Yes     Comment: once a year.  . Drug Use: No  . Sexual Activity: Not Asked     Comment: lives with son, 81 yo daughter, boyfriend, no dietary   Other Topics Concern  . None   Social History Narrative   Review of Systems - See HPI.  All other ROS are negative.  BP 104/62 mmHg   Pulse 95  Temp(Src) 98.4 F (36.9 C) (Oral)  Resp 18  Ht 5\' 7"  (1.702 m)  Wt 278 lb (126.1 kg)  BMI 43.53 kg/m2  SpO2 98%  Physical Exam  Constitutional: She is oriented to person, place, and time and well-developed, well-nourished, and in no distress.  HENT:  Head: Normocephalic and atraumatic.  Right Ear: Tympanic membrane, external ear and ear canal normal.  Left Ear: External ear and ear canal normal. Tympanic membrane is erythematous and bulging.  Nose: Mucosal edema and rhinorrhea present. Right sinus exhibits frontal sinus tenderness. Left sinus exhibits frontal sinus tenderness.  Mouth/Throat: Uvula is midline, oropharynx is clear and moist and mucous membranes are normal.  Eyes: Conjunctivae are normal. Pupils are equal, round, and reactive to light.  Neck: Neck supple.  Cardiovascular: Normal rate, regular rhythm, normal heart sounds and intact distal pulses.   Pulmonary/Chest: Effort normal and breath sounds normal. No respiratory distress. She has no wheezes. She has no rales. She exhibits no tenderness.  Neurological: She is alert and oriented to person, place, and time.  Skin: Skin is warm and dry. No rash noted.  Psychiatric: Affect normal.  Vitals reviewed.  No results found for this or any previous visit (from the past 2160 hour(s)).  Assessment/Plan: Acute bacterial sinusitis Rx Augmentin.  Increase fluids.  Rest.  Saline nasal spray.  Probiotic.  Mucinex as directed.  Humidifier in bedroom. Tessalon per orders.  Call or return to clinic if symptoms are not improving.

## 2015-10-20 NOTE — Patient Instructions (Signed)
Please take antibiotic as directed.  Increase fluid intake.  Use Saline nasal spray.  Take a daily multivitamin. Use Tessalon as directed for cough. Continue inhaler.  Place a humidifier in the bedroom.  Please call or return clinic if symptoms are not improving.  Sinusitis Sinusitis is redness, soreness, and swelling (inflammation) of the paranasal sinuses. Paranasal sinuses are air pockets within the bones of your face (beneath the eyes, the middle of the forehead, or above the eyes). In healthy paranasal sinuses, mucus is able to drain out, and air is able to circulate through them by way of your nose. However, when your paranasal sinuses are inflamed, mucus and air can become trapped. This can allow bacteria and other germs to grow and cause infection. Sinusitis can develop quickly and last only a short time (acute) or continue over a long period (chronic). Sinusitis that lasts for more than 12 weeks is considered chronic.  CAUSES  Causes of sinusitis include:  Allergies.  Structural abnormalities, such as displacement of the cartilage that separates your nostrils (deviated septum), which can decrease the air flow through your nose and sinuses and affect sinus drainage.  Functional abnormalities, such as when the small hairs (cilia) that line your sinuses and help remove mucus do not work properly or are not present. SYMPTOMS  Symptoms of acute and chronic sinusitis are the same. The primary symptoms are pain and pressure around the affected sinuses. Other symptoms include:  Upper toothache.  Earache.  Headache.  Bad breath.  Decreased sense of smell and taste.  A cough, which worsens when you are lying flat.  Fatigue.  Fever.  Thick drainage from your nose, which often is green and may contain pus (purulent).  Swelling and warmth over the affected sinuses. DIAGNOSIS  Your caregiver will perform a physical exam. During the exam, your caregiver may:  Look in your nose for  signs of abnormal growths in your nostrils (nasal polyps).  Tap over the affected sinus to check for signs of infection.  View the inside of your sinuses (endoscopy) with a special imaging device with a light attached (endoscope), which is inserted into your sinuses. If your caregiver suspects that you have chronic sinusitis, one or more of the following tests may be recommended:  Allergy tests.  Nasal culture A sample of mucus is taken from your nose and sent to a lab and screened for bacteria.  Nasal cytology A sample of mucus is taken from your nose and examined by your caregiver to determine if your sinusitis is related to an allergy. TREATMENT  Most cases of acute sinusitis are related to a viral infection and will resolve on their own within 10 days. Sometimes medicines are prescribed to help relieve symptoms (pain medicine, decongestants, nasal steroid sprays, or saline sprays).  However, for sinusitis related to a bacterial infection, your caregiver will prescribe antibiotic medicines. These are medicines that will help kill the bacteria causing the infection.  Rarely, sinusitis is caused by a fungal infection. In theses cases, your caregiver will prescribe antifungal medicine. For some cases of chronic sinusitis, surgery is needed. Generally, these are cases in which sinusitis recurs more than 3 times per year, despite other treatments. HOME CARE INSTRUCTIONS   Drink plenty of water. Water helps thin the mucus so your sinuses can drain more easily.  Use a humidifier.  Inhale steam 3 to 4 times a day (for example, sit in the bathroom with the shower running).  Apply a warm, moist washcloth to  your face 3 to 4 times a day, or as directed by your caregiver.  Use saline nasal sprays to help moisten and clean your sinuses.  Take over-the-counter or prescription medicines for pain, discomfort, or fever only as directed by your caregiver. SEEK IMMEDIATE MEDICAL CARE IF:  You have  increasing pain or severe headaches.  You have nausea, vomiting, or drowsiness.  You have swelling around your face.  You have vision problems.  You have a stiff neck.  You have difficulty breathing. MAKE SURE YOU:   Understand these instructions.  Will watch your condition.  Will get help right away if you are not doing well or get worse. Document Released: 10/02/2005 Document Revised: 12/25/2011 Document Reviewed: 10/17/2011 [facility] Patient Information 2014 [place], [place].

## 2015-11-02 ENCOUNTER — Ambulatory Visit: Payer: Self-pay | Admitting: Family Medicine

## 2015-11-30 ENCOUNTER — Encounter: Payer: Self-pay | Admitting: Family Medicine

## 2015-11-30 ENCOUNTER — Ambulatory Visit (INDEPENDENT_AMBULATORY_CARE_PROVIDER_SITE_OTHER): Payer: BLUE CROSS/BLUE SHIELD | Admitting: Family Medicine

## 2015-11-30 VITALS — BP 128/68 | HR 74 | Temp 97.9°F | Ht 67.0 in | Wt 276.2 lb

## 2015-11-30 DIAGNOSIS — F17200 Nicotine dependence, unspecified, uncomplicated: Secondary | ICD-10-CM

## 2015-11-30 DIAGNOSIS — R3589 Other polyuria: Secondary | ICD-10-CM

## 2015-11-30 DIAGNOSIS — K2100 Gastro-esophageal reflux disease with esophagitis, without bleeding: Secondary | ICD-10-CM

## 2015-11-30 DIAGNOSIS — R358 Other polyuria: Secondary | ICD-10-CM

## 2015-11-30 DIAGNOSIS — M549 Dorsalgia, unspecified: Secondary | ICD-10-CM

## 2015-11-30 DIAGNOSIS — IMO0002 Reserved for concepts with insufficient information to code with codable children: Secondary | ICD-10-CM

## 2015-11-30 DIAGNOSIS — K219 Gastro-esophageal reflux disease without esophagitis: Secondary | ICD-10-CM | POA: Diagnosis not present

## 2015-11-30 DIAGNOSIS — G9050 Complex regional pain syndrome I, unspecified: Secondary | ICD-10-CM

## 2015-11-30 DIAGNOSIS — R030 Elevated blood-pressure reading, without diagnosis of hypertension: Secondary | ICD-10-CM

## 2015-11-30 DIAGNOSIS — R739 Hyperglycemia, unspecified: Secondary | ICD-10-CM

## 2015-11-30 DIAGNOSIS — E785 Hyperlipidemia, unspecified: Secondary | ICD-10-CM

## 2015-11-30 DIAGNOSIS — F418 Other specified anxiety disorders: Secondary | ICD-10-CM

## 2015-11-30 DIAGNOSIS — R0602 Shortness of breath: Secondary | ICD-10-CM

## 2015-11-30 DIAGNOSIS — IMO0001 Reserved for inherently not codable concepts without codable children: Secondary | ICD-10-CM

## 2015-11-30 DIAGNOSIS — Z124 Encounter for screening for malignant neoplasm of cervix: Secondary | ICD-10-CM

## 2015-11-30 DIAGNOSIS — R000 Tachycardia, unspecified: Secondary | ICD-10-CM

## 2015-11-30 DIAGNOSIS — R631 Polydipsia: Secondary | ICD-10-CM

## 2015-11-30 MED ORDER — HYDROCODONE-ACETAMINOPHEN 7.5-325 MG PO TABS: 2.0000 | ORAL_TABLET | Freq: Two times a day (BID) | ORAL | Status: DC | PRN

## 2015-11-30 MED ORDER — OMEPRAZOLE 40 MG PO CPDR: 40.0000 mg | DELAYED_RELEASE_CAPSULE | Freq: Every day | ORAL | Status: DC

## 2015-11-30 MED ORDER — CARISOPRODOL 350 MG PO TABS: 350.0000 mg | ORAL_TABLET | Freq: Four times a day (QID) | ORAL | Status: DC | PRN

## 2015-11-30 MED ORDER — ESCITALOPRAM OXALATE 10 MG PO TABS: 10.0000 mg | ORAL_TABLET | Freq: Every day | ORAL | Status: DC

## 2015-11-30 MED ORDER — ALPRAZOLAM 0.25 MG PO TABS: 0.2500 mg | ORAL_TABLET | Freq: Two times a day (BID) | ORAL | Status: DC | PRN

## 2015-11-30 MED ORDER — RANITIDINE HCL 300 MG PO TABS: 300.0000 mg | ORAL_TABLET | Freq: Every day | ORAL | Status: DC | PRN

## 2015-11-30 NOTE — Progress Notes (Signed)
Pre visit review using our clinic review tool, if applicable. No additional management support is needed unless otherwise documented below in the visit note. 

## 2015-11-30 NOTE — Patient Instructions (Signed)
Major Depressive Disorder Major depressive disorder is a mental illness. It also may be called clinical depression or unipolar depression. Major depressive disorder usually causes feelings of sadness, hopelessness, or helplessness. Some people with this disorder do not feel particularly sad but lose interest in doing things they used to enjoy (anhedonia). Major depressive disorder also can cause physical symptoms. It can interfere with work, school, relationships, and other normal everyday activities. The disorder varies in severity but is longer lasting and more serious than the sadness we all feel from time to time in our lives. Major depressive disorder often is triggered by stressful life events or major life changes. Examples of these triggers include divorce, loss of your job or home, a move, and the death of a family member or close friend. Sometimes this disorder occurs for no obvious reason at all. People who have family members with major depressive disorder or bipolar disorder are at higher risk for developing this disorder, with or without life stressors. Major depressive disorder can occur at any age. It may occur just once in your life (single episode major depressive disorder). It may occur multiple times (recurrent major depressive disorder). SYMPTOMS People with major depressive disorder have either anhedonia or depressed mood on nearly a daily basis for at least 2 weeks or longer. Symptoms of depressed mood include:  Feelings of sadness (blue or down in the dumps) or emptiness.  Feelings of hopelessness or helplessness.  Tearfulness or episodes of crying (may be observed by others).  Irritability (children and adolescents). In addition to depressed mood or anhedonia or both, people with this disorder have at least four of the following symptoms:  Difficulty sleeping or sleeping too much.   Significant change (increase or decrease) in appetite or weight.   Lack of energy or  motivation.  Feelings of guilt and worthlessness.   Difficulty concentrating, remembering, or making decisions.  Unusually slow movement (psychomotor retardation) or restlessness (as observed by others).   Recurrent wishes for death, recurrent thoughts of self-harm (suicide), or a suicide attempt. People with major depressive disorder commonly have persistent negative thoughts about themselves, other people, and the world. People with severe major depressive disorder may experiencedistorted beliefs or perceptions about the world (psychotic delusions). They also may see or hear things that are not real (psychotic hallucinations). DIAGNOSIS Major depressive disorder is diagnosed through an assessment by your health care provider. Your health care provider will ask aboutaspects of your daily life, such as mood,sleep, and appetite, to see if you have the diagnostic symptoms of major depressive disorder. Your health care provider may ask about your medical history and use of alcohol or drugs, including prescription medicines. Your health care provider also may do a physical exam and blood work. This is because certain medical conditions and the use of certain substances can cause major depressive disorder-like symptoms (secondary depression). Your health care provider also may refer you to a mental health specialist for further evaluation and treatment. TREATMENT It is important to recognize the symptoms of major depressive disorder and seek treatment. The following treatments can be prescribed for this disorder:   Medicine. Antidepressant medicines usually are prescribed. Antidepressant medicines are thought to correct chemical imbalances in the brain that are commonly associated with major depressive disorder. Other types of medicine may be added if the symptoms do not respond to antidepressant medicines alone or if psychotic delusions or hallucinations occur.  Talk therapy. Talk therapy can be  helpful in treating major depressive disorder by providing   support, education, and guidance. Certain types of talk therapy also can help with negative thinking (cognitive behavioral therapy) and with relationship issues that trigger this disorder (interpersonal therapy). A mental health specialist can help determine which treatment is best for you. Most people with major depressive disorder do well with a combination of medicine and talk therapy. Treatments involving electrical stimulation of the brain can be used in situations with extremely severe symptoms or when medicine and talk therapy do not work over time. These treatments include electroconvulsive therapy, transcranial magnetic stimulation, and vagal nerve stimulation.   This information is not intended to replace advice given to you by your health care provider. Make sure you discuss any questions you have with your health care provider.   Document Released: 01/27/2013 Document Revised: 10/23/2014 Document Reviewed: 01/27/2013 Elsevier Interactive Patient Education 2016 [employer]  

## 2015-12-05 ENCOUNTER — Encounter: Payer: Self-pay | Admitting: Family Medicine

## 2015-12-05 NOTE — Assessment & Plan Note (Signed)
minimize simple carbs. Increase exercise as tolerated.  

## 2015-12-05 NOTE — Assessment & Plan Note (Signed)
Did run an MDQ and patient did not test positive for bipolar disorder. Start SSRI and report any concerns.

## 2015-12-05 NOTE — Assessment & Plan Note (Signed)
Well controlled. Encouraged heart healthy diet such as the DASH diet and exercise as tolerated.  

## 2015-12-05 NOTE — Progress Notes (Signed)
Patient ID: Beverly Erickson, female   DOB: 18-Oct-1970, 45 y.o.   MRN: [id number]   Subjective:    Patient ID: Beverly Erickson, female    DOB: 11/04/1970, 45 y.o.   MRN: [id number]  Chief Complaint  Patient presents with  . Pain    HPI Patient is in today for ollow-up. She is accompanied by her husband. She continues to struggle with significant stressorsdue to custody battles over her grandchildren and herdaughter and son-in-law's drug use.She is tearful and anxious. She endorses anhedonia but denies suicidal ideation.She continues to struggle with back painand diffuse myalgias and arthralgias.No recent trauma or injury. Denies CP/palp/SOB/HA/congestion/fevers/GI or GU c/o. Taking meds as prescribed  Past Medical History  Diagnosis Date  . GERD (gastroesophageal reflux disease)   . Anemia   . Chicken pox   . Depression   . Obesity   . RSD (reflex sympathetic dystrophy)   . Tobacco use disorder 01/14/2014    1ppd  . Other and unspecified hyperlipidemia 01/14/2014  . Kidney stone 02/13/2014    Right, small  . Unspecified constipation 05/25/2014  . Preventative health care 07/05/2014  . Depression with anxiety 08/30/2015    Past Surgical History  Procedure Laterality Date  . Cesarean section  2000  . Plate and screws in left arm  2005    humerus  . Wisdom tooth extraction  45 yrs old    Family History  Problem Relation Age of Onset  . Arthritis Mother     Living  . Hypertension Mother   . Thrombocytopenia Mother   . Hyperlipidemia Father   . Hypertension Father   . Thrombocytopenia Maternal Grandmother   . Stroke Maternal Grandmother   . Hypertension Maternal Grandmother   . Alcohol abuse Maternal Grandfather   . Heart disease Maternal Grandfather   . Parkinson's disease Maternal Grandfather   . Alcohol abuse Paternal Grandfather   . Cancer Paternal Grandfather   . Hypothyroidism Daughter   . Diabetes Daughter     type 1  . Dementia Paternal Grandmother     Social  History   Social History  . Marital Status: Widowed    Spouse Name: N/A  . Number of Children: 3  . Years of Education: N/A   Occupational History  . Not on file.   Social History Main Topics  . Smoking status: Current Every Day Smoker    Types: Cigarettes  . Smokeless tobacco: Not on file  . Alcohol Use: Yes     Comment: once a year.  . Drug Use: No  . Sexual Activity: Not on file     Comment: lives with son, 48 yo daughter, boyfriend, no dietary   Other Topics Concern  . Not on file   Social History Narrative    Outpatient Prescriptions Prior to Visit  Medication Sig Dispense Refill  . albuterol (PROVENTIL HFA;VENTOLIN HFA) 108 (90 BASE) MCG/ACT inhaler Inhale 2 puffs into the lungs every 6 (six) hours as needed for wheezing or shortness of breath. 1 Inhaler 2  . amoxicillin-clavulanate (AUGMENTIN) 875-125 MG tablet Take 1 tablet by mouth 2 (two) times daily. 14 tablet 0  . aspirin 81 MG tablet Take 81 mg by mouth daily.    . benzonatate (TESSALON) 100 MG capsule Take 1 capsule (100 mg total) by mouth 2 (two) times daily as needed for cough. 20 capsule 0  . buPROPion (WELLBUTRIN SR) 150 MG 12 hr tablet one tablet by mouth twice daily 60 tablet 2  . KRILL OIL PO  Take 1 capsule by mouth daily.    Marland Kitchen lidocaine (LIDODERM) 5 % Place 3 patches onto the skin daily. Remove & Discard patch within 12 hours or as directed by MD Allergic to Codeine  failed NSAIDs and steroids, no response Good response to these patches in past Already on Hydrocodone daily with incomplete response  Already on hydrocodone daily with inadequate response 90 patch 5  . Misc. Devices (ROLLATOR) MISC Patient with severe pain and weakness in left leg Reflex Sympathetic Dystrophy 1 each 0  . nitroGLYCERIN (NITROSTAT) 0.4 MG SL tablet Place 1 tablet (0.4 mg total) under the tongue every 5 (five) minutes as needed for chest pain. 25 tablet 1  . Potassium 75 MG TABS Take 1 each by mouth at bedtime.    .  pramipexole (MIRAPEX) 1 MG tablet Take 1 tablet (1 mg total) by mouth at bedtime. 30 tablet 2  . ALPRAZolam (XANAX) 0.25 MG tablet Take 1 tablet (0.25 mg total) by mouth 2 (two) times daily as needed for anxiety. 20 tablet 1  . carisoprodol (SOMA) 350 MG tablet Take 1 tablet (350 mg total) by mouth 4 (four) times daily as needed for muscle spasms. 30 tablet 0  . HYDROcodone-acetaminophen (NORCO) 7.5-325 MG tablet Take 2 tablets by mouth 2 (two) times daily as needed. 60 tablet 0  . omeprazole (PRILOSEC) 40 MG capsule Take 1 capsule (40 mg total) by mouth daily. 90 capsule 1  . ranitidine (ZANTAC) 300 MG tablet Take 1 tablet (300 mg total) by mouth daily as needed for heartburn. 90 tablet 1   No facility-administered medications prior to visit.    Allergies  Allergen Reactions  . Codeine Nausea Only    Review of Systems  Constitutional: Positive for malaise/fatigue. Negative for fever and chills.  HENT: Negative for congestion and hearing loss.   Eyes: Negative for discharge.  Respiratory: Negative for cough, sputum production and shortness of breath.   Cardiovascular: Negative for chest pain, palpitations and leg swelling.  Gastrointestinal: Negative for heartburn, nausea, vomiting, abdominal pain, diarrhea, constipation and blood in stool.  Genitourinary: Negative for dysuria, urgency, frequency and hematuria.  Musculoskeletal: Positive for myalgias, back pain, joint pain and neck pain. Negative for falls.  Skin: Negative for rash.  Neurological: Negative for dizziness, sensory change, loss of consciousness, weakness and headaches.  Endo/Heme/Allergies: Negative for environmental allergies. Does not bruise/bleed easily.  Psychiatric/Behavioral: Positive for depression. Negative for suicidal ideas. The patient is nervous/anxious. The patient does not have insomnia.        Objective:    Physical Exam  Constitutional: She is oriented to person, place, and time. She appears  well-developed and well-nourished. No distress.  HENT:  Head: Normocephalic and atraumatic.  Nose: Nose normal.  Eyes: Right eye exhibits no discharge. Left eye exhibits no discharge.  Neck: Normal range of motion. Neck supple.  Cardiovascular: Normal rate and regular rhythm.   No murmur heard. Pulmonary/Chest: Effort normal and breath sounds normal.  Abdominal: Soft. Bowel sounds are normal. There is no tenderness.  Musculoskeletal: She exhibits no edema.  Neurological: She is alert and oriented to person, place, and time.  Skin: Skin is warm and dry.  Psychiatric: She has a normal mood and affect.  Nursing note and vitals reviewed.   BP 128/68 mmHg  Pulse 74  Temp(Src) 97.9 F (36.6 C) (Oral)  Ht 5\' 7"  (1.702 m)  Wt 276 lb 4 oz (125.306 kg)  BMI 43.26 kg/m2  SpO2 99% Wt Readings from Last  3 Encounters:  11/30/15 276 lb 4 oz (125.306 kg)  10/20/15 278 lb (126.1 kg)  08/24/15 274 lb 4 oz (124.399 kg)     Lab Results  Component Value Date   WBC 6.3 01/21/2015   HGB 13.6 01/21/2015   HCT 39.6 01/21/2015   PLT 260.0 01/21/2015   GLUCOSE 93 01/21/2015   CHOL 153 01/21/2015   TRIG 62.0 01/21/2015   HDL 37.60* 01/21/2015   LDLCALC 103* 01/21/2015   ALT 19 01/21/2015   AST 18 01/21/2015   NA 138 01/21/2015   K 4.0 01/21/2015   CL 107 01/21/2015   CREATININE 0.82 01/21/2015   BUN 18 01/21/2015   CO2 24 01/21/2015   TSH 1.74 01/21/2015   HGBA1C 5.6 01/21/2015    Lab Results  Component Value Date   TSH 1.74 01/21/2015   Lab Results  Component Value Date   WBC 6.3 01/21/2015   HGB 13.6 01/21/2015   HCT 39.6 01/21/2015   MCV 86.4 01/21/2015   PLT 260.0 01/21/2015   Lab Results  Component Value Date   NA 138 01/21/2015   K 4.0 01/21/2015   CO2 24 01/21/2015   GLUCOSE 93 01/21/2015   BUN 18 01/21/2015   CREATININE 0.82 01/21/2015   BILITOT 0.3 01/21/2015   ALKPHOS 59 01/21/2015   AST 18 01/21/2015   ALT 19 01/21/2015   PROT 7.1 01/21/2015   ALBUMIN  3.9 01/21/2015   CALCIUM 9.1 01/21/2015   GFR 80.92 01/21/2015   Lab Results  Component Value Date   CHOL 153 01/21/2015   Lab Results  Component Value Date   HDL 37.60* 01/21/2015   Lab Results  Component Value Date   LDLCALC 103* 01/21/2015   Lab Results  Component Value Date   TRIG 62.0 01/21/2015   Lab Results  Component Value Date   CHOLHDL 4 01/21/2015   Lab Results  Component Value Date   HGBA1C 5.6 01/21/2015       Assessment & Plan:   Problem List Items Addressed This Visit    Depression with anxiety    Did run an MDQ and patient did not test positive for bipolar disorder. Start SSRI and report any concerns.      Elevated blood pressure    Well controlled. Encouraged heart healthy diet such as the DASH diet and exercise as tolerated.       GERD (gastroesophageal reflux disease) - Primary   Relevant Medications   omeprazole (PRILOSEC) 40 MG capsule   ranitidine (ZANTAC) 300 MG tablet   Hyperglycemia    minimize simple carbs. Increase exercise as tolerated.       Mid back pain on right side   Relevant Medications   HYDROcodone-acetaminophen (NORCO) 7.5-325 MG tablet   carisoprodol (SOMA) 350 MG tablet   RSD (reflex sympathetic dystrophy)   Relevant Medications   HYDROcodone-acetaminophen (NORCO) 7.5-325 MG tablet   ALPRAZolam (XANAX) 0.25 MG tablet   carisoprodol (SOMA) 350 MG tablet   escitalopram (LEXAPRO) 10 MG tablet   Tobacco use disorder    Encouraged complete cessation. Discussed need to quit as relates to risk of numerous cancers, cardiac and pulmonary disease as well as neurologic complications. Counseled for greater than 3 minutes       Other Visit Diagnoses    SOB (shortness of breath)        Relevant Medications    omeprazole (PRILOSEC) 40 MG capsule    ranitidine (ZANTAC) 300 MG tablet    Polyuria  Relevant Medications    omeprazole (PRILOSEC) 40 MG capsule    ranitidine (ZANTAC) 300 MG tablet    Polydipsia         Relevant Medications    omeprazole (PRILOSEC) 40 MG capsule    ranitidine (ZANTAC) 300 MG tablet    Tachycardia        Relevant Medications    omeprazole (PRILOSEC) 40 MG capsule    ranitidine (ZANTAC) 300 MG tablet    Hyperlipidemia        Relevant Medications    omeprazole (PRILOSEC) 40 MG capsule    ranitidine (ZANTAC) 300 MG tablet    Complex regional pain syndrome        Relevant Medications    HYDROcodone-acetaminophen (NORCO) 7.5-325 MG tablet    ALPRAZolam (XANAX) 0.25 MG tablet    carisoprodol (SOMA) 350 MG tablet    escitalopram (LEXAPRO) 10 MG tablet    Cervical cancer screening        Relevant Medications    ALPRAZolam (XANAX) 0.25 MG tablet       I am having Ms. Schneider start on escitalopram. I am also having her maintain her Potassium, aspirin, KRILL OIL PO, lidocaine, albuterol, ROLLATOR, nitroGLYCERIN, buPROPion, pramipexole, amoxicillin-clavulanate, benzonatate, omeprazole, ranitidine, HYDROcodone-acetaminophen, ALPRAZolam, and carisoprodol.  Meds ordered this encounter  Medications  . omeprazole (PRILOSEC) 40 MG capsule    Sig: Take 1 capsule (40 mg total) by mouth daily.    Dispense:  90 capsule    Refill:  1    DISCONTINUE ALL PREVIOUS REFILLS FOR THIS MEDICATION  . ranitidine (ZANTAC) 300 MG tablet    Sig: Take 1 tablet (300 mg total) by mouth daily as needed for heartburn.    Dispense:  90 tablet    Refill:  1    DISCONTINUE ALL PREVIOUS REFILLS FOR THIS MEDICATION  . HYDROcodone-acetaminophen (NORCO) 7.5-325 MG tablet    Sig: Take 2 tablets by mouth 2 (two) times daily as needed.    Dispense:  60 tablet    Refill:  0  . ALPRAZolam (XANAX) 0.25 MG tablet    Sig: Take 1 tablet (0.25 mg total) by mouth 2 (two) times daily as needed for anxiety.    Dispense:  20 tablet    Refill:  1  . carisoprodol (SOMA) 350 MG tablet    Sig: Take 1 tablet (350 mg total) by mouth 4 (four) times daily as needed for muscle spasms.    Dispense:  30 tablet    Refill:   0  . escitalopram (LEXAPRO) 10 MG tablet    Sig: Take 1 tablet (10 mg total) by mouth daily.    Dispense:  30 tablet    Refill:  2     Penni Homans, MD

## 2015-12-05 NOTE — Assessment & Plan Note (Signed)
Encouraged complete cessation. Discussed need to quit as relates to risk of numerous cancers, cardiac and pulmonary disease as well as neurologic complications. Counseled for greater than 3 minutes 

## 2016-02-08 ENCOUNTER — Ambulatory Visit: Payer: Medicare Other | Admitting: Family Medicine

## 2016-02-08 ENCOUNTER — Telehealth: Payer: Self-pay | Admitting: Family Medicine

## 2016-02-08 DIAGNOSIS — Z0289 Encounter for other administrative examinations: Secondary | ICD-10-CM

## 2016-02-09 NOTE — Telephone Encounter (Signed)
Pt was no show 02/08/16 6:00pm for follow up appt, pt has not rescheduled, 2nd no show w/in 12 months, charge or no charge?

## 2016-02-09 NOTE — Telephone Encounter (Signed)
charge 

## 2016-02-14 ENCOUNTER — Encounter: Payer: Self-pay | Admitting: Family Medicine

## 2016-02-14 NOTE — Telephone Encounter (Signed)
Marked to charge and mailing no show letter °

## 2016-02-18 DIAGNOSIS — F329 Major depressive disorder, single episode, unspecified: Secondary | ICD-10-CM | POA: Diagnosis not present

## 2016-02-18 DIAGNOSIS — S6991XA Unspecified injury of right wrist, hand and finger(s), initial encounter: Secondary | ICD-10-CM | POA: Diagnosis not present

## 2016-02-18 DIAGNOSIS — Z7982 Long term (current) use of aspirin: Secondary | ICD-10-CM | POA: Diagnosis not present

## 2016-02-18 DIAGNOSIS — F17210 Nicotine dependence, cigarettes, uncomplicated: Secondary | ICD-10-CM | POA: Diagnosis not present

## 2016-02-18 DIAGNOSIS — Z79899 Other long term (current) drug therapy: Secondary | ICD-10-CM | POA: Diagnosis not present

## 2016-02-18 DIAGNOSIS — S61212A Laceration without foreign body of right middle finger without damage to nail, initial encounter: Secondary | ICD-10-CM | POA: Diagnosis not present

## 2016-02-18 DIAGNOSIS — S60031A Contusion of right middle finger without damage to nail, initial encounter: Secondary | ICD-10-CM | POA: Diagnosis not present

## 2016-04-14 ENCOUNTER — Ambulatory Visit (INDEPENDENT_AMBULATORY_CARE_PROVIDER_SITE_OTHER): Payer: BLUE CROSS/BLUE SHIELD | Admitting: Family Medicine

## 2016-04-14 ENCOUNTER — Encounter: Payer: Self-pay | Admitting: Family Medicine

## 2016-04-14 VITALS — BP 104/82 | HR 81 | Temp 98.2°F | Ht 67.0 in | Wt 280.1 lb

## 2016-04-14 DIAGNOSIS — E785 Hyperlipidemia, unspecified: Secondary | ICD-10-CM

## 2016-04-14 DIAGNOSIS — K2100 Gastro-esophageal reflux disease with esophagitis, without bleeding: Secondary | ICD-10-CM

## 2016-04-14 DIAGNOSIS — G90522 Complex regional pain syndrome I of left lower limb: Secondary | ICD-10-CM

## 2016-04-14 DIAGNOSIS — K219 Gastro-esophageal reflux disease without esophagitis: Secondary | ICD-10-CM

## 2016-04-14 DIAGNOSIS — IMO0001 Reserved for inherently not codable concepts without codable children: Secondary | ICD-10-CM

## 2016-04-14 DIAGNOSIS — R030 Elevated blood-pressure reading, without diagnosis of hypertension: Secondary | ICD-10-CM

## 2016-04-14 DIAGNOSIS — R0602 Shortness of breath: Secondary | ICD-10-CM | POA: Diagnosis not present

## 2016-04-14 DIAGNOSIS — R000 Tachycardia, unspecified: Secondary | ICD-10-CM

## 2016-04-14 DIAGNOSIS — R358 Other polyuria: Secondary | ICD-10-CM

## 2016-04-14 DIAGNOSIS — M549 Dorsalgia, unspecified: Secondary | ICD-10-CM

## 2016-04-14 DIAGNOSIS — G9050 Complex regional pain syndrome I, unspecified: Secondary | ICD-10-CM

## 2016-04-14 DIAGNOSIS — R3589 Other polyuria: Secondary | ICD-10-CM

## 2016-04-14 DIAGNOSIS — F17200 Nicotine dependence, unspecified, uncomplicated: Secondary | ICD-10-CM

## 2016-04-14 DIAGNOSIS — IMO0002 Reserved for concepts with insufficient information to code with codable children: Secondary | ICD-10-CM

## 2016-04-14 DIAGNOSIS — R631 Polydipsia: Secondary | ICD-10-CM

## 2016-04-14 DIAGNOSIS — R739 Hyperglycemia, unspecified: Secondary | ICD-10-CM

## 2016-04-14 MED ORDER — RANITIDINE HCL 300 MG PO TABS: 300.0000 mg | ORAL_TABLET | Freq: Every day | ORAL | Status: DC | PRN

## 2016-04-14 MED ORDER — HYDROCODONE-ACETAMINOPHEN 7.5-325 MG PO TABS: 2.0000 | ORAL_TABLET | Freq: Two times a day (BID) | ORAL | Status: DC | PRN

## 2016-04-14 MED ORDER — OMEPRAZOLE 40 MG PO CPDR: 40.0000 mg | DELAYED_RELEASE_CAPSULE | Freq: Every day | ORAL | Status: DC

## 2016-04-14 MED FILL — OMEPRAZOLE DR 40 MG CAPSULE: 40 | 30 days supply | Qty: 30 | Fill #0

## 2016-04-14 NOTE — Assessment & Plan Note (Signed)
Avoid offending foods, start probiotics. Do not eat large meals in late evening and consider raising head of bed.  

## 2016-04-14 NOTE — Assessment & Plan Note (Signed)
Well controlled. Encouraged heart healthy diet such as the DASH diet and exercise as tolerated.  

## 2016-04-14 NOTE — Progress Notes (Signed)
Pre visit review using our clinic review tool, if applicable. No additional management support is needed unless otherwise documented below in the visit note. 

## 2016-04-14 NOTE — Assessment & Plan Note (Addendum)
Continues to struggle with chronic pain in left leg only gets relief of pain with Hydrocodone encouraged to consider a referral for permanent solution such as nerve ablation. Given refill on pain meds today.

## 2016-04-14 NOTE — Assessment & Plan Note (Signed)
hgba1c acceptable, minimize simple carbs. Increase exercise as tolerated.  

## 2016-04-14 NOTE — Progress Notes (Signed)
Patient ID: Beverly Erickson, female   DOB: 07-11-1971, 45 y.o.   MRN: [id number]   Subjective:    Patient ID: Beverly Erickson, female    DOB: [birth year], 45 y.o.   MRN: [id number]  Chief Complaint  Patient presents with  . Pain    HPI Patient is in today for follow up. She continues to struggle with daily pain, most notable in the her left leg. No recent illness or injury. No recent falls or new symptoms. Denies CP/palp/SOB/HA/congestion/fevers/GI or GU c/o. Taking meds as prescribed  Past Medical History  Diagnosis Date  . GERD (gastroesophageal reflux disease)   . Anemia   . Chicken pox   . Depression   . Obesity   . RSD (reflex sympathetic dystrophy)   . Tobacco use disorder 01/14/2014    1ppd  . Other and unspecified hyperlipidemia 01/14/2014  . Kidney stone 02/13/2014    Right, small  . Unspecified constipation 05/25/2014  . Preventative health care 07/05/2014  . Depression with anxiety 08/30/2015    Past Surgical History  Procedure Laterality Date  . Cesarean section  2000  . Plate and screws in left arm  2005    humerus  . Wisdom tooth extraction  45 yrs old    Family History  Problem Relation Age of Onset  . Arthritis Mother     Living  . Hypertension Mother   . Thrombocytopenia Mother   . Hyperlipidemia Father   . Hypertension Father   . Thrombocytopenia Maternal Grandmother   . Stroke Maternal Grandmother   . Hypertension Maternal Grandmother   . Alcohol abuse Maternal Grandfather   . Heart disease Maternal Grandfather   . Parkinson's disease Maternal Grandfather   . Alcohol abuse Paternal Grandfather   . Cancer Paternal Grandfather   . Hypothyroidism Daughter   . Diabetes Daughter     type 1  . Dementia Paternal Grandmother     Social History   Social History  . Marital Status: Widowed    Spouse Name: N/A  . Number of Children: 3  . Years of Education: N/A   Occupational History  . Not on file.   Social History Main Topics  . Smoking status: Current  Every Day Smoker    Types: Cigarettes  . Smokeless tobacco: Not on file  . Alcohol Use: Yes     Comment: once a year.  . Drug Use: No  . Sexual Activity: Not on file     Comment: lives with son, 64 yo daughter, boyfriend, no dietary   Other Topics Concern  . Not on file   Social History Narrative    Outpatient Prescriptions Prior to Visit  Medication Sig Dispense Refill  . albuterol (PROVENTIL HFA;VENTOLIN HFA) 108 (90 BASE) MCG/ACT inhaler Inhale 2 puffs into the lungs every 6 (six) hours as needed for wheezing or shortness of breath. 1 Inhaler 2  . ALPRAZolam (XANAX) 0.25 MG tablet Take 1 tablet (0.25 mg total) by mouth 2 (two) times daily as needed for anxiety. 20 tablet 1  . aspirin 81 MG tablet Take 81 mg by mouth daily.    Marland Kitchen buPROPion (WELLBUTRIN SR) 150 MG 12 hr tablet one tablet by mouth twice daily 60 tablet 2  . carisoprodol (SOMA) 350 MG tablet Take 1 tablet (350 mg total) by mouth 4 (four) times daily as needed for muscle spasms. 30 tablet 0  . escitalopram (LEXAPRO) 10 MG tablet Take 1 tablet (10 mg total) by mouth daily. 30 tablet 2  .  KRILL OIL PO Take 1 capsule by mouth daily.    Marland Kitchen lidocaine (LIDODERM) 5 % Place 3 patches onto the skin daily. Remove & Discard patch within 12 hours or as directed by MD Allergic to Codeine  failed NSAIDs and steroids, no response Good response to these patches in past Already on Hydrocodone daily with incomplete response  Already on hydrocodone daily with inadequate response 90 patch 5  . Misc. Devices (ROLLATOR) MISC Patient with severe pain and weakness in left leg Reflex Sympathetic Dystrophy 1 each 0  . nitroGLYCERIN (NITROSTAT) 0.4 MG SL tablet Place 1 tablet (0.4 mg total) under the tongue every 5 (five) minutes as needed for chest pain. 25 tablet 1  . Potassium 75 MG TABS Take 1 each by mouth at bedtime.    . pramipexole (MIRAPEX) 1 MG tablet Take 1 tablet (1 mg total) by mouth at bedtime. 30 tablet 2  .  HYDROcodone-acetaminophen (NORCO) 7.5-325 MG tablet Take 2 tablets by mouth 2 (two) times daily as needed. 60 tablet 0  . omeprazole (PRILOSEC) 40 MG capsule Take 1 capsule (40 mg total) by mouth daily. 90 capsule 1  . ranitidine (ZANTAC) 300 MG tablet Take 1 tablet (300 mg total) by mouth daily as needed for heartburn. 90 tablet 1  . amoxicillin-clavulanate (AUGMENTIN) 875-125 MG tablet Take 1 tablet by mouth 2 (two) times daily. 14 tablet 0  . benzonatate (TESSALON) 100 MG capsule Take 1 capsule (100 mg total) by mouth 2 (two) times daily as needed for cough. 20 capsule 0   No facility-administered medications prior to visit.    Allergies  Allergen Reactions  . Codeine Nausea Only    Review of Systems  Constitutional: Negative for fever and malaise/fatigue.  HENT: Negative for congestion.   Eyes: Negative for blurred vision.  Respiratory: Negative for shortness of breath.   Cardiovascular: Negative for chest pain, palpitations and leg swelling.  Gastrointestinal: Negative for nausea, abdominal pain and blood in stool.  Genitourinary: Negative for dysuria and frequency.  Musculoskeletal: Positive for myalgias and joint pain. Negative for falls.  Skin: Negative for rash.  Neurological: Positive for focal weakness. Negative for dizziness, loss of consciousness and headaches.  Endo/Heme/Allergies: Negative for environmental allergies.  Psychiatric/Behavioral: Negative for depression. The patient is not nervous/anxious.        Objective:    Physical Exam  Constitutional: She is oriented to person, place, and time. She appears well-developed and well-nourished. No distress.  HENT:  Head: Normocephalic and atraumatic.  Eyes: Conjunctivae are normal.  Neck: Neck supple. No thyromegaly present.  Cardiovascular: Normal rate, regular rhythm and normal heart sounds.   No murmur heard. Pulmonary/Chest: Effort normal and breath sounds normal. No respiratory distress.  Abdominal: Soft.  Bowel sounds are normal. She exhibits no distension and no mass. There is no tenderness.  Musculoskeletal: She exhibits edema.  1+ edema left leg  Lymphadenopathy:    She has no cervical adenopathy.  Neurological: She is alert and oriented to person, place, and time.  Skin: Skin is warm and dry.  Psychiatric: She has a normal mood and affect. Her behavior is normal.    BP 104/82 mmHg  Pulse 81  Temp(Src) 98.2 F (36.8 C) (Oral)  Ht 5\' 7"  (1.702 m)  Wt 280 lb 2 oz (127.064 kg)  BMI 43.86 kg/m2  SpO2 96% Wt Readings from Last 3 Encounters:  04/14/16 280 lb 2 oz (127.064 kg)  11/30/15 276 lb 4 oz (125.306 kg)  10/20/15 278 lb (  126.1 kg)     Lab Results  Component Value Date   WBC 6.3 01/21/2015   HGB 13.6 01/21/2015   HCT 39.6 01/21/2015   PLT 260.0 01/21/2015   GLUCOSE 93 01/21/2015   CHOL 153 01/21/2015   TRIG 62.0 01/21/2015   HDL 37.60* 01/21/2015   LDLCALC 103* 01/21/2015   ALT 19 01/21/2015   AST 18 01/21/2015   NA 138 01/21/2015   K 4.0 01/21/2015   CL 107 01/21/2015   CREATININE 0.82 01/21/2015   BUN 18 01/21/2015   CO2 24 01/21/2015   TSH 1.74 01/21/2015   HGBA1C 5.6 01/21/2015    Lab Results  Component Value Date   TSH 1.74 01/21/2015   Lab Results  Component Value Date   WBC 6.3 01/21/2015   HGB 13.6 01/21/2015   HCT 39.6 01/21/2015   MCV 86.4 01/21/2015   PLT 260.0 01/21/2015   Lab Results  Component Value Date   NA 138 01/21/2015   K 4.0 01/21/2015   CO2 24 01/21/2015   GLUCOSE 93 01/21/2015   BUN 18 01/21/2015   CREATININE 0.82 01/21/2015   BILITOT 0.3 01/21/2015   ALKPHOS 59 01/21/2015   AST 18 01/21/2015   ALT 19 01/21/2015   PROT 7.1 01/21/2015   ALBUMIN 3.9 01/21/2015   CALCIUM 9.1 01/21/2015   GFR 80.92 01/21/2015   Lab Results  Component Value Date   CHOL 153 01/21/2015   Lab Results  Component Value Date   HDL 37.60* 01/21/2015   Lab Results  Component Value Date   LDLCALC 103* 01/21/2015   Lab Results    Component Value Date   TRIG 62.0 01/21/2015   Lab Results  Component Value Date   CHOLHDL 4 01/21/2015   Lab Results  Component Value Date   HGBA1C 5.6 01/21/2015       Assessment & Plan:   Problem List Items Addressed This Visit    Complex regional pain syndrome I of lower limb    Continues to struggle with chronic pain in left leg only gets relief of pain with Hydrocodone encouraged to consider a referral for permanent solution such as nerve ablation. Given refill on pain meds today.       GERD (gastroesophageal reflux disease) - Primary    Avoid offending foods, start probiotics. Do not eat large meals in late evening and consider raising head of bed.       Relevant Medications   ranitidine (ZANTAC) 300 MG tablet   omeprazole (PRILOSEC) 40 MG capsule   RSD (reflex sympathetic dystrophy)    Left leg. Pain persists and she feels she only gets relief from Hydrocodone.      Relevant Medications   HYDROcodone-acetaminophen (NORCO) 7.5-325 MG tablet   Tobacco use disorder    Encouraged complete cessation. Discussed need to quit as relates to risk of numerous cancers, cardiac and pulmonary disease as well as neurologic complications. Counseled for greater than 3 minutes. 1 ppd      Hyperglycemia    hgba1c acceptable, minimize simple carbs. Increase exercise as tolerated.       Mid back pain on right side   Relevant Medications   HYDROcodone-acetaminophen (NORCO) 7.5-325 MG tablet   Elevated blood pressure    Well controlled. Encouraged heart healthy diet such as the DASH diet and exercise as tolerated       Other Visit Diagnoses    SOB (shortness of breath)        Relevant Medications  ranitidine (ZANTAC) 300 MG tablet    omeprazole (PRILOSEC) 40 MG capsule    Polyuria        Relevant Medications    ranitidine (ZANTAC) 300 MG tablet    omeprazole (PRILOSEC) 40 MG capsule    Polydipsia        Relevant Medications    ranitidine (ZANTAC) 300 MG tablet     omeprazole (PRILOSEC) 40 MG capsule    Tachycardia        Relevant Medications    ranitidine (ZANTAC) 300 MG tablet    omeprazole (PRILOSEC) 40 MG capsule    Hyperlipidemia        Relevant Medications    ranitidine (ZANTAC) 300 MG tablet    omeprazole (PRILOSEC) 40 MG capsule    Complex regional pain syndrome        Relevant Medications    HYDROcodone-acetaminophen (NORCO) 7.5-325 MG tablet       I have discontinued Ms. Alwin's amoxicillin-clavulanate and benzonatate. I am also having her maintain her Potassium, aspirin, KRILL OIL PO, lidocaine, albuterol, ROLLATOR, nitroGLYCERIN, buPROPion, pramipexole, ALPRAZolam, carisoprodol, escitalopram, ranitidine, omeprazole, and HYDROcodone-acetaminophen.  Meds ordered this encounter  Medications  . ranitidine (ZANTAC) 300 MG tablet    Sig: Take 1 tablet (300 mg total) by mouth daily as needed for heartburn.    Dispense:  90 tablet    Refill:  1    DISCONTINUE ALL PREVIOUS REFILLS FOR THIS MEDICATION  . omeprazole (PRILOSEC) 40 MG capsule    Sig: Take 1 capsule (40 mg total) by mouth daily.    Dispense:  90 capsule    Refill:  1    DISCONTINUE ALL PREVIOUS REFILLS FOR THIS MEDICATION  . HYDROcodone-acetaminophen (NORCO) 7.5-325 MG tablet    Sig: Take 2 tablets by mouth 2 (two) times daily as needed.    Dispense:  60 tablet    Refill:  0     Penni Homans, MD

## 2016-04-14 NOTE — Assessment & Plan Note (Signed)
Encouraged complete cessation. Discussed need to quit as relates to risk of numerous cancers, cardiac and pulmonary disease as well as neurologic complications. Counseled for greater than 3 minutes. 1 ppd

## 2016-04-14 NOTE — Assessment & Plan Note (Signed)
Left leg. Pain persists and she feels she only gets relief from Hydrocodone.

## 2016-04-14 NOTE — Patient Instructions (Signed)

## 2016-08-11 ENCOUNTER — Encounter: Payer: Self-pay | Admitting: Physician Assistant

## 2016-08-11 ENCOUNTER — Ambulatory Visit (INDEPENDENT_AMBULATORY_CARE_PROVIDER_SITE_OTHER): Payer: BLUE CROSS/BLUE SHIELD | Admitting: Physician Assistant

## 2016-08-11 ENCOUNTER — Other Ambulatory Visit: Payer: Self-pay

## 2016-08-11 VITALS — BP 124/72 | HR 91 | Temp 98.2°F | Ht 67.0 in | Wt 281.0 lb

## 2016-08-11 DIAGNOSIS — G8929 Other chronic pain: Secondary | ICD-10-CM | POA: Diagnosis not present

## 2016-08-11 DIAGNOSIS — K219 Gastro-esophageal reflux disease without esophagitis: Secondary | ICD-10-CM

## 2016-08-11 DIAGNOSIS — R1013 Epigastric pain: Secondary | ICD-10-CM

## 2016-08-11 DIAGNOSIS — G9050 Complex regional pain syndrome I, unspecified: Secondary | ICD-10-CM

## 2016-08-11 DIAGNOSIS — Z720 Tobacco use: Secondary | ICD-10-CM

## 2016-08-11 DIAGNOSIS — Z0000 Encounter for general adult medical examination without abnormal findings: Secondary | ICD-10-CM | POA: Diagnosis not present

## 2016-08-11 DIAGNOSIS — Z1239 Encounter for other screening for malignant neoplasm of breast: Secondary | ICD-10-CM

## 2016-08-11 DIAGNOSIS — M549 Dorsalgia, unspecified: Secondary | ICD-10-CM | POA: Diagnosis not present

## 2016-08-11 DIAGNOSIS — R0681 Apnea, not elsewhere classified: Secondary | ICD-10-CM

## 2016-08-11 DIAGNOSIS — Z1231 Encounter for screening mammogram for malignant neoplasm of breast: Secondary | ICD-10-CM

## 2016-08-11 LAB — COMPREHENSIVE METABOLIC PANEL
ALT: 19 U/L (ref 6–29)
AST: 15 U/L (ref 10–30)
Albumin: 4 g/dL (ref 3.6–5.1)
Alkaline Phosphatase: 61 U/L (ref 33–115)
BUN: 12 mg/dL (ref 7–25)
CHLORIDE: 109 mmol/L (ref 98–110)
CO2: 23 mmol/L (ref 20–31)
CREATININE: 0.7 mg/dL (ref 0.50–1.10)
Calcium: 8.9 mg/dL (ref 8.6–10.2)
Glucose, Bld: 98 mg/dL (ref 65–99)
Potassium: 4.1 mmol/L (ref 3.5–5.3)
SODIUM: 140 mmol/L (ref 135–146)
TOTAL PROTEIN: 6.6 g/dL (ref 6.1–8.1)
Total Bilirubin: 0.3 mg/dL (ref 0.2–1.2)

## 2016-08-11 LAB — CBC
HCT: 43.6 % (ref 35.0–45.0)
Hemoglobin: 14.7 g/dL (ref 11.7–15.5)
MCH: 30.4 pg (ref 27.0–33.0)
MCHC: 33.7 g/dL (ref 32.0–36.0)
MCV: 90.3 fL (ref 80.0–100.0)
MPV: 10.4 fL (ref 7.5–12.5)
PLATELETS: 265 10*3/uL (ref 140–400)
RBC: 4.83 MIL/uL (ref 3.80–5.10)
RDW: 13.8 % (ref 11.0–15.0)
WBC: 7 10*3/uL (ref 3.8–10.8)

## 2016-08-11 LAB — HEMOGLOBIN A1C
Hgb A1c MFr Bld: 5.1 % (ref <5.7)
Mean Plasma Glucose: 100 mg/dL

## 2016-08-11 LAB — TSH: TSH: 1.51 m[IU]/L

## 2016-08-11 MED ORDER — ALPRAZOLAM 0.25 MG PO TABS: 0.2500 mg | ORAL_TABLET | Freq: Two times a day (BID) | ORAL | 1 refills | Status: DC | PRN

## 2016-08-11 MED ORDER — NITROGLYCERIN 0.4 MG SL SUBL: 0.4000 mg | SUBLINGUAL_TABLET | SUBLINGUAL | 1 refills | Status: DC | PRN

## 2016-08-11 MED ORDER — BUPROPION HCL ER (SR) 150 MG PO TB12: ORAL_TABLET | ORAL | 2 refills | Status: DC

## 2016-08-11 MED ORDER — PRAMIPEXOLE DIHYDROCHLORIDE 1 MG PO TABS: 1.0000 mg | ORAL_TABLET | Freq: Every day | ORAL | 2 refills | Status: AC

## 2016-08-11 MED ORDER — RANITIDINE HCL 300 MG PO TABS: 300.0000 mg | ORAL_TABLET | Freq: Every day | ORAL | 1 refills | Status: DC | PRN

## 2016-08-11 MED ORDER — HYDROCODONE-ACETAMINOPHEN 7.5-325 MG PO TABS: 2.0000 | ORAL_TABLET | Freq: Two times a day (BID) | ORAL | 0 refills | Status: DC | PRN

## 2016-08-11 MED ORDER — OMEPRAZOLE 40 MG PO CPDR: 40.0000 mg | DELAYED_RELEASE_CAPSULE | Freq: Every day | ORAL | 1 refills | Status: DC

## 2016-08-11 MED ORDER — CARISOPRODOL 350 MG PO TABS: 350.0000 mg | ORAL_TABLET | Freq: Four times a day (QID) | ORAL | 0 refills | Status: DC | PRN

## 2016-08-11 NOTE — Patient Instructions (Signed)
Please go to the lab for blood work.   Our office will call you with your results unless you have chosen to receive results via MyChart.  If your blood work is normal we will follow-up each year for physicals and as scheduled for chronic medical problems.  If anything is abnormal we will treat accordingly and get you in for a follow-up.  Please continue acid reflux medications. No late night eating. Avoid spicy foods. You will be contact to schedule an appointment with Gastroenterology for further assessment. We will alter regimen if indicated by lab results.  You will also be contacted to schedule a screening 3D mammogram for breast cancer.  Preventive Care for Adults, Female A healthy lifestyle and preventive care can promote health and wellness. Preventive health guidelines for women include the following key practices.  A routine yearly physical is a good way to check with your health care provider about your health and preventive screening. It is a chance to share any concerns and updates on your health and to receive a thorough exam.  Visit your dentist for a routine exam and preventive care every 6 months. Brush your teeth twice a day and floss once a day. Good oral hygiene prevents tooth decay and gum disease.  The frequency of eye exams is based on your age, health, family medical history, use of contact lenses, and other factors. Follow your health care provider's recommendations for frequency of eye exams.  Eat a healthy diet. Foods like vegetables, fruits, whole grains, low-fat dairy products, and lean protein foods contain the nutrients you need without too many calories. Decrease your intake of foods high in solid fats, added sugars, and salt. Eat the right amount of calories for you.Get information about a proper diet from your health care provider, if necessary.  Regular physical exercise is one of the most important things you can do for your health. Most adults should get  at least 150 minutes of moderate-intensity exercise (any activity that increases your heart rate and causes you to sweat) each week. In addition, most adults need muscle-strengthening exercises on 2 or more days a week.  Maintain a healthy weight. The body mass index (BMI) is a screening tool to identify possible weight problems. It provides an estimate of body fat based on height and weight. Your health care provider can find your BMI and can help you achieve or maintain a healthy weight.For adults 20 years and older:  A BMI below 18.5 is considered underweight.  A BMI of 18.5 to 24.9 is normal.  A BMI of 25 to 29.9 is considered overweight.  A BMI of 30 and above is considered obese.  Maintain normal blood lipids and cholesterol levels by exercising and minimizing your intake of saturated fat. Eat a balanced diet with plenty of fruit and vegetables. Blood tests for lipids and cholesterol should begin at age 10 and be repeated every 5 years. If your lipid or cholesterol levels are high, you are over 50, or you are at high risk for heart disease, you may need your cholesterol levels checked more frequently.Ongoing high lipid and cholesterol levels should be treated with medicines if diet and exercise are not working.  If you smoke, find out from your health care provider how to quit. If you do not use tobacco, do not start.  Lung cancer screening is recommended for adults aged 89-80 years who are at high risk for developing lung cancer because of a history of smoking. A yearly low-dose  CT scan of the lungs is recommended for people who have at least a 30-pack-year history of smoking and are a current smoker or have quit within the past 15 years. A pack year of smoking is smoking an average of 1 pack of cigarettes a day for 1 year (for example: 1 pack a day for 30 years or 2 packs a day for 15 years). Yearly screening should continue until the smoker has stopped smoking for at least 15 years. Yearly  screening should be stopped for people who develop a health problem that would prevent them from having lung cancer treatment.  If you are pregnant, do not drink alcohol. If you are breastfeeding, be very cautious about drinking alcohol. If you are not pregnant and choose to drink alcohol, do not have more than 1 drink per day. One drink is considered to be 12 ounces (355 mL) of beer, 5 ounces (148 mL) of wine, or 1.5 ounces (44 mL) of liquor.  Avoid use of street drugs. Do not share needles with anyone. Ask for help if you need support or instructions about stopping the use of drugs.  High blood pressure causes heart disease and increases the risk of stroke. Your blood pressure should be checked at least every 1 to 2 years. Ongoing high blood pressure should be treated with medicines if weight loss and exercise do not work.  If you are 14-3 years old, ask your health care provider if you should take aspirin to prevent strokes.  Diabetes screening is done by taking a blood sample to check your blood glucose level after you have not eaten for a certain period of time (fasting). If you are not overweight and you do not have risk factors for diabetes, you should be screened once every 3 years starting at age 80. If you are overweight or obese and you are 70-62 years of age, you should be screened for diabetes every year as part of your cardiovascular risk assessment.  Breast cancer screening is essential preventive care for women. You should practice "breast self-awareness." This means understanding the normal appearance and feel of your breasts and may include breast self-examination. Any changes detected, no matter how small, should be reported to a health care provider. Women in their 39s and 30s should have a clinical breast exam (CBE) by a health care provider as part of a regular health exam every 1 to 3 years. After age 76, women should have a CBE every year. Starting at age 65, women should  consider having a mammogram (breast X-ray test) every year. Women who have a family history of breast cancer should talk to their health care provider about genetic screening. Women at a high risk of breast cancer should talk to their health care providers about having an MRI and a mammogram every year.  Breast cancer gene (BRCA)-related cancer risk assessment is recommended for women who have family members with BRCA-related cancers. BRCA-related cancers include breast, ovarian, tubal, and peritoneal cancers. Having family members with these cancers may be associated with an increased risk for harmful changes (mutations) in the breast cancer genes BRCA1 and BRCA2. Results of the assessment will determine the need for genetic counseling and BRCA1 and BRCA2 testing.  Your health care provider may recommend that you be screened regularly for cancer of the pelvic organs (ovaries, uterus, and vagina). This screening involves a pelvic examination, including checking for microscopic changes to the surface of your cervix (Pap test). You may be encouraged to have  this screening done every 3 years, beginning at age 50.  For women ages 59-65, health care providers may recommend pelvic exams and Pap testing every 3 years, or they may recommend the Pap and pelvic exam, combined with testing for human papilloma virus (HPV), every 5 years. Some types of HPV increase your risk of cervical cancer. Testing for HPV may also be done on women of any age with unclear Pap test results.  Other health care providers may not recommend any screening for nonpregnant women who are considered low risk for pelvic cancer and who do not have symptoms. Ask your health care provider if a screening pelvic exam is right for you.  If you have had past treatment for cervical cancer or a condition that could lead to cancer, you need Pap tests and screening for cancer for at least 20 years after your treatment. If Pap tests have been  discontinued, your risk factors (such as having a new sexual partner) need to be reassessed to determine if screening should resume. Some women have medical problems that increase the chance of getting cervical cancer. In these cases, your health care provider may recommend more frequent screening and Pap tests.  Colorectal cancer can be detected and often prevented. Most routine colorectal cancer screening begins at the age of 37 years and continues through age 36 years. However, your health care provider may recommend screening at an earlier age if you have risk factors for colon cancer. On a yearly basis, your health care provider may provide home test kits to check for hidden blood in the stool. Use of a small camera at the end of a tube, to directly examine the colon (sigmoidoscopy or colonoscopy), can detect the earliest forms of colorectal cancer. Talk to your health care provider about this at age 88, when routine screening begins. Direct exam of the colon should be repeated every 5-10 years through age 42 years, unless early forms of precancerous polyps or small growths are found.  People who are at an increased risk for hepatitis B should be screened for this virus. You are considered at high risk for hepatitis B if:  You were born in a country where hepatitis B occurs often. Talk with your health care provider about which countries are considered high risk.  Your parents were born in a high-risk country and you have not received a shot to protect against hepatitis B (hepatitis B vaccine).  You have HIV or AIDS.  You use needles to inject street drugs.  You live with, or have sex with, someone who has hepatitis B.  You get hemodialysis treatment.  You take certain medicines for conditions like cancer, organ transplantation, and autoimmune conditions.  Hepatitis C blood testing is recommended for all people born from [birth year] through 1964 and any individual with known risks for hepatitis  C.  Practice safe sex. Use condoms and avoid high-risk sexual practices to reduce the spread of sexually transmitted infections (STIs). STIs include gonorrhea, chlamydia, syphilis, trichomonas, herpes, HPV, and human immunodeficiency virus (HIV). Herpes, HIV, and HPV are viral illnesses that have no cure. They can result in disability, cancer, and death.  You should be screened for sexually transmitted illnesses (STIs) including gonorrhea and chlamydia if:  You are sexually active and are younger than 24 years.  You are older than 24 years and your health care provider tells you that you are at risk for this type of infection.  Your sexual activity has changed since you were last  screened and you are at an increased risk for chlamydia or gonorrhea. Ask your health care provider if you are at risk.  If you are at risk of being infected with HIV, it is recommended that you take a prescription medicine daily to prevent HIV infection. This is called preexposure prophylaxis (PrEP). You are considered at risk if:  You are sexually active and do not regularly use condoms or know the HIV status of your partner(s).  You take drugs by injection.  You are sexually active with a partner who has HIV.  Talk with your health care provider about whether you are at high risk of being infected with HIV. If you choose to begin PrEP, you should first be tested for HIV. You should then be tested every 3 months for as long as you are taking PrEP.  Osteoporosis is a disease in which the bones lose minerals and strength with aging. This can result in serious bone fractures or breaks. The risk of osteoporosis can be identified using a bone density scan. Women ages 89 years and over and women at risk for fractures or osteoporosis should discuss screening with their health care providers. Ask your health care provider whether you should take a calcium supplement or vitamin D to reduce the rate of  osteoporosis.  Menopause can be associated with physical symptoms and risks. Hormone replacement therapy is available to decrease symptoms and risks. You should talk to your health care provider about whether hormone replacement therapy is right for you.  Use sunscreen. Apply sunscreen liberally and repeatedly throughout the day. You should seek shade when your shadow is shorter than you. Protect yourself by wearing long sleeves, pants, a wide-brimmed hat, and sunglasses year round, whenever you are outdoors.  Once a month, do a whole body skin exam, using a mirror to look at the skin on your back. Tell your health care provider of new moles, moles that have irregular borders, moles that are larger than a pencil eraser, or moles that have changed in shape or color.  Stay current with required vaccines (immunizations).  Influenza vaccine. All adults should be immunized every year.  Tetanus, diphtheria, and acellular pertussis (Td, Tdap) vaccine. Pregnant women should receive 1 dose of Tdap vaccine during each pregnancy. The dose should be obtained regardless of the length of time since the last dose. Immunization is preferred during the 27th-36th week of gestation. An adult who has not previously received Tdap or who does not know her vaccine status should receive 1 dose of Tdap. This initial dose should be followed by tetanus and diphtheria toxoids (Td) booster doses every 10 years. Adults with an unknown or incomplete history of completing a 3-dose immunization series with Td-containing vaccines should begin or complete a primary immunization series including a Tdap dose. Adults should receive a Td booster every 10 years.  Varicella vaccine. An adult without evidence of immunity to varicella should receive 2 doses or a second dose if she has previously received 1 dose. Pregnant females who do not have evidence of immunity should receive the first dose after pregnancy. This first dose should be  obtained before leaving the health care facility. The second dose should be obtained 4-8 weeks after the first dose.  Human papillomavirus (HPV) vaccine. Females aged 13-26 years who have not received the vaccine previously should obtain the 3-dose series. The vaccine is not recommended for use in pregnant females. However, pregnancy testing is not needed before receiving a dose. If a female  is found to be pregnant after receiving a dose, no treatment is needed. In that case, the remaining doses should be delayed until after the pregnancy. Immunization is recommended for any person with an immunocompromised condition through the age of 78 years if she did not get any or all doses earlier. During the 3-dose series, the second dose should be obtained 4-8 weeks after the first dose. The third dose should be obtained 24 weeks after the first dose and 16 weeks after the second dose.  Zoster vaccine. One dose is recommended for adults aged 75 years or older unless certain conditions are present.  Measles, mumps, and rubella (MMR) vaccine. Adults born before [birth year] generally are considered immune to measles and mumps. Adults born in [birth year] or later should have 1 or more doses of MMR vaccine unless there is a contraindication to the vaccine or there is laboratory evidence of immunity to each of the three diseases. A routine second dose of MMR vaccine should be obtained at least 28 days after the first dose for students attending postsecondary schools, health care workers, or international travelers. People who received inactivated measles vaccine or an unknown type of measles vaccine during 1963-1967 should receive 2 doses of MMR vaccine. People who received inactivated mumps vaccine or an unknown type of mumps vaccine before 1979 and are at high risk for mumps infection should consider immunization with 2 doses of MMR vaccine. For females of childbearing age, rubella immunity should be determined. If there is no evidence  of immunity, females who are not pregnant should be vaccinated. If there is no evidence of immunity, females who are pregnant should delay immunization until after pregnancy. Unvaccinated health care workers born before [birth year] who lack laboratory evidence of measles, mumps, or rubella immunity or laboratory confirmation of disease should consider measles and mumps immunization with 2 doses of MMR vaccine or rubella immunization with 1 dose of MMR vaccine.  Pneumococcal 13-valent conjugate (PCV13) vaccine. When indicated, a person who is uncertain of his immunization history and has no record of immunization should receive the PCV13 vaccine. All adults 47 years of age and older should receive this vaccine. An adult aged 4 years or older who has certain medical conditions and has not been previously immunized should receive 1 dose of PCV13 vaccine. This PCV13 should be followed with a dose of pneumococcal polysaccharide (PPSV23) vaccine. Adults who are at high risk for pneumococcal disease should obtain the PPSV23 vaccine at least 8 weeks after the dose of PCV13 vaccine. Adults older than 44 years of age who have normal immune system function should obtain the PPSV23 vaccine dose at least 1 year after the dose of PCV13 vaccine.  Pneumococcal polysaccharide (PPSV23) vaccine. When PCV13 is also indicated, PCV13 should be obtained first. All adults aged 21 years and older should be immunized. An adult younger than age 9 years who has certain medical conditions should be immunized. Any person who resides in a nursing home or long-term care facility should be immunized. An adult smoker should be immunized. People with an immunocompromised condition and certain other conditions should receive both PCV13 and PPSV23 vaccines. People with human immunodeficiency virus (HIV) infection should be immunized as soon as possible after diagnosis. Immunization during chemotherapy or radiation therapy should be avoided. Routine use  of PPSV23 vaccine is not recommended for American Indians, [place] Natives, or people younger than 65 years unless there are medical conditions that require PPSV23 vaccine. When indicated, people who have unknown immunization and  have no record of immunization should receive PPSV23 vaccine. One-time revaccination 5 years after the first dose of PPSV23 is recommended for people aged 19-64 years who have chronic kidney failure, nephrotic syndrome, asplenia, or immunocompromised conditions. People who received 1-2 doses of PPSV23 before age 27 years should receive another dose of PPSV23 vaccine at age 62 years or later if at least 5 years have passed since the previous dose. Doses of PPSV23 are not needed for people immunized with PPSV23 at or after age 52 years.  Meningococcal vaccine. Adults with asplenia or persistent complement component deficiencies should receive 2 doses of quadrivalent meningococcal conjugate (MenACWY-D) vaccine. The doses should be obtained at least 2 months apart. Microbiologists working with certain meningococcal bacteria, [employer] recruits, people at risk during an outbreak, and people who travel to or live in countries with a high rate of meningitis should be immunized. A first-year college student up through age 34 years who is living in a residence hall should receive a dose if she did not receive a dose on or after her 16th birthday. Adults who have certain high-risk conditions should receive one or more doses of vaccine.  Hepatitis A vaccine. Adults who wish to be protected from this disease, have certain high-risk conditions, work with hepatitis A-infected animals, work in hepatitis A research labs, or travel to or work in countries with a high rate of hepatitis A should be immunized. Adults who were previously unvaccinated and who anticipate close contact with an international adoptee during the first 60 days after arrival in the [place] from a country with a high rate of  hepatitis A should be immunized.  Hepatitis B vaccine. Adults who wish to be protected from this disease, have certain high-risk conditions, may be exposed to blood or other infectious body fluids, are household contacts or sex partners of hepatitis B positive people, are clients or workers in certain care facilities, or travel to or work in countries with a high rate of hepatitis B should be immunized.  Haemophilus influenzae type b (Hib) vaccine. A previously unvaccinated person with asplenia or sickle cell disease or having a scheduled splenectomy should receive 1 dose of Hib vaccine. Regardless of previous immunization, a recipient of a hematopoietic stem cell transplant should receive a 3-dose series 6-12 months after her successful transplant. Hib vaccine is not recommended for adults with HIV infection. Preventive Services / Frequency Ages 54 to 80 years  Blood pressure check.** / Every 3-5 years.  Lipid and cholesterol check.** / Every 5 years beginning at age 87.  Clinical breast exam.** / Every 3 years for women in their 41s and 43s.  BRCA-related cancer risk assessment.** / For women who have family members with a BRCA-related cancer (breast, ovarian, tubal, or peritoneal cancers).  Pap test.** / Every 2 years from ages 49 through 38. Every 3 years starting at age 71 through age 36 or 69 with a history of 3 consecutive normal Pap tests.  HPV screening.** / Every 3 years from ages 106 through ages 25 to 70 with a history of 3 consecutive normal Pap tests.  Hepatitis C blood test.** / For any individual with known risks for hepatitis C.  Skin self-exam. / Monthly.  Influenza vaccine. / Every year.  Tetanus, diphtheria, and acellular pertussis (Tdap, Td) vaccine.** / Consult your health care provider. Pregnant women should receive 1 dose of Tdap vaccine during each pregnancy. 1 dose of Td every 10 years.  Varicella vaccine.** / Consult your health care  provider. Pregnant females  who do not have evidence of immunity should receive the first dose after pregnancy.  HPV vaccine. / 3 doses over 6 months, if 1 and younger. The vaccine is not recommended for use in pregnant females. However, pregnancy testing is not needed before receiving a dose.  Measles, mumps, rubella (MMR) vaccine.** / You need at least 1 dose of MMR if you were born in 1957 or later. You may also need a 2nd dose. For females of childbearing age, rubella immunity should be determined. If there is no evidence of immunity, females who are not pregnant should be vaccinated. If there is no evidence of immunity, females who are pregnant should delay immunization until after pregnancy.  Pneumococcal 13-valent conjugate (PCV13) vaccine.** / Consult your health care provider.  Pneumococcal polysaccharide (PPSV23) vaccine.** / 1 to 2 doses if you smoke cigarettes or if you have certain conditions.  Meningococcal vaccine.** / 1 dose if you are age 68 to 55 years and a Market researcher living in a residence hall, or have one of several medical conditions, you need to get vaccinated against meningococcal disease. You may also need additional booster doses.  Hepatitis A vaccine.** / Consult your health care provider.  Hepatitis B vaccine.** / Consult your health care provider.  Haemophilus influenzae type b (Hib) vaccine.** / Consult your health care provider. Ages 34 to 73 years  Blood pressure check.** / Every year.  Lipid and cholesterol check.** / Every 5 years beginning at age 11 years.  Lung cancer screening. / Every year if you are aged 59-80 years and have a 30-pack-year history of smoking and currently smoke or have quit within the past 15 years. Yearly screening is stopped once you have quit smoking for at least 15 years or develop a health problem that would prevent you from having lung cancer treatment.  Clinical breast exam.** / Every year after age 65 years.  BRCA-related cancer risk  assessment.** / For women who have family members with a BRCA-related cancer (breast, ovarian, tubal, or peritoneal cancers).  Mammogram.** / Every year beginning at age 36 years and continuing for as long as you are in good health. Consult with your health care provider.  Pap test.** / Every 3 years starting at age 47 years through age 91 or 66 years with a history of 3 consecutive normal Pap tests.  HPV screening.** / Every 3 years from ages 81 years through ages 107 to 56 years with a history of 3 consecutive normal Pap tests.  Fecal occult blood test (FOBT) of stool. / Every year beginning at age 46 years and continuing until age 32 years. You may not need to do this test if you get a colonoscopy every 10 years.  Flexible sigmoidoscopy or colonoscopy.** / Every 5 years for a flexible sigmoidoscopy or every 10 years for a colonoscopy beginning at age 19 years and continuing until age 67 years.  Hepatitis C blood test.** / For all people born from [birth year] through 1964 and any individual with known risks for hepatitis C.  Skin self-exam. / Monthly.  Influenza vaccine. / Every year.  Tetanus, diphtheria, and acellular pertussis (Tdap/Td) vaccine.** / Consult your health care provider. Pregnant women should receive 1 dose of Tdap vaccine during each pregnancy. 1 dose of Td every 10 years.  Varicella vaccine.** / Consult your health care provider. Pregnant females who do not have evidence of immunity should receive the first dose after pregnancy.  Zoster vaccine.** / 1 dose  for adults aged 54 years or older.  Measles, mumps, rubella (MMR) vaccine.** / You need at least 1 dose of MMR if you were born in 1957 or later. You may also need a second dose. For females of childbearing age, rubella immunity should be determined. If there is no evidence of immunity, females who are not pregnant should be vaccinated. If there is no evidence of immunity, females who are pregnant should delay immunization until  after pregnancy.  Pneumococcal 13-valent conjugate (PCV13) vaccine.** / Consult your health care provider.  Pneumococcal polysaccharide (PPSV23) vaccine.** / 1 to 2 doses if you smoke cigarettes or if you have certain conditions.  Meningococcal vaccine.** / Consult your health care provider.  Hepatitis A vaccine.** / Consult your health care provider.  Hepatitis B vaccine.** / Consult your health care provider.  Haemophilus influenzae type b (Hib) vaccine.** / Consult your health care provider. Ages 35 years and over  Blood pressure check.** / Every year.  Lipid and cholesterol check.** / Every 5 years beginning at age 73 years.  Lung cancer screening. / Every year if you are aged 59-80 years and have a 30-pack-year history of smoking and currently smoke or have quit within the past 15 years. Yearly screening is stopped once you have quit smoking for at least 15 years or develop a health problem that would prevent you from having lung cancer treatment.  Clinical breast exam.** / Every year after age 42 years.  BRCA-related cancer risk assessment.** / For women who have family members with a BRCA-related cancer (breast, ovarian, tubal, or peritoneal cancers).  Mammogram.** / Every year beginning at age 59 years and continuing for as long as you are in good health. Consult with your health care provider.  Pap test.** / Every 3 years starting at age 34 years through age 41 or 7 years with 3 consecutive normal Pap tests. Testing can be stopped between 65 and 70 years with 3 consecutive normal Pap tests and no abnormal Pap or HPV tests in the past 10 years.  HPV screening.** / Every 3 years from ages 62 years through ages 49 or 24 years with a history of 3 consecutive normal Pap tests. Testing can be stopped between 65 and 70 years with 3 consecutive normal Pap tests and no abnormal Pap or HPV tests in the past 10 years.  Fecal occult blood test (FOBT) of stool. / Every year beginning at  age 35 years and continuing until age 39 years. You may not need to do this test if you get a colonoscopy every 10 years.  Flexible sigmoidoscopy or colonoscopy.** / Every 5 years for a flexible sigmoidoscopy or every 10 years for a colonoscopy beginning at age 72 years and continuing until age 39 years.  Hepatitis C blood test.** / For all people born from [birth year] through 1964 and any individual with known risks for hepatitis C.  Osteoporosis screening.** / A one-time screening for women ages 74 years and over and women at risk for fractures or osteoporosis.  Skin self-exam. / Monthly.  Influenza vaccine. / Every year.  Tetanus, diphtheria, and acellular pertussis (Tdap/Td) vaccine.** / 1 dose of Td every 10 years.  Varicella vaccine.** / Consult your health care provider.  Zoster vaccine.** / 1 dose for adults aged 45 years or older.  Pneumococcal 13-valent conjugate (PCV13) vaccine.** / Consult your health care provider.  Pneumococcal polysaccharide (PPSV23) vaccine.** / 1 dose for all adults aged 22 years and older.  Meningococcal vaccine.** / Consult  your health care provider.  Hepatitis A vaccine.** / Consult your health care provider.  Hepatitis B vaccine.** / Consult your health care provider.  Haemophilus influenzae type b (Hib) vaccine.** / Consult your health care provider. ** Family history and personal history of risk and conditions may change your health care provider's recommendations.   This information is not intended to replace advice given to you by your health care provider. Make sure you discuss any questions you have with your health care provider.   Document Released: 11/28/2001 Document Revised: 10/23/2014 Document Reviewed: 02/27/2011 Elsevier Interactive Patient Education [employer]. .

## 2016-08-11 NOTE — Progress Notes (Signed)
Patient presents to clinic today for annual exam.  Patient is fasting for labs. Body mass index is 44.01 kg/m. Patient endorses well-balanced diet overall. Exercise is severely limited by patient's RSD. Patient is overdue for screening mammogram. Denies family history of breast cancer. Agrees to repeat mammogram.   Acute Concerns: Patient endorses an episode of nausea with vomiting dark emesis 1 month ago. Endorses since that time she has had no residual nausea or repeat emesis. Does note worsening of her chronic GERD symptoms. Patient is currently on H2 blocker and PPI, not taking consistently. Notes some episodes of epigastric discomfort. Denies fever, chills, sweats, unexplainable weight loss. Denies melena, hematochezia or tenesmus. Would like to see a Copywriter, advertising.   Patient also endorses loud snoring. Spouse notes multiple episodes where patient stops breathing in her sleep. Patient denies prior diagnosis of sleep apnea. Body mass index is 44.01 kg/m.   Health Maintenance: Immunizations -- Tetanus up-to-date. Declines Flu shot. Mammogram -- Overdue. Agrees to repeat screening. Average risk. Denies symptoms. PAP -- up-to-date. Last 01/17/14 -- Dr. Joya Gaskins (GYN). Normal per patient.   Past Medical History:  Diagnosis Date  . Anemia   . Chicken pox   . Depression   . Depression with anxiety 08/30/2015  . GERD (gastroesophageal reflux disease)   . Kidney stone 02/13/2014   Right, small  . Obesity   . Other and unspecified hyperlipidemia 01/14/2014  . Preventative health care 07/05/2014  . RSD (reflex sympathetic dystrophy)   . Tobacco use disorder 01/14/2014   1ppd  . Unspecified constipation 05/25/2014    Past Surgical History:  Procedure Laterality Date  . CESAREAN SECTION  2000  . plate and screws in left arm  2005   humerus  . WISDOM TOOTH EXTRACTION  44 yrs old    Current Outpatient Prescriptions on File Prior to Visit  Medication Sig Dispense Refill  . albuterol  (PROVENTIL HFA;VENTOLIN HFA) 108 (90 BASE) MCG/ACT inhaler Inhale 2 puffs into the lungs every 6 (six) hours as needed for wheezing or shortness of breath. 1 Inhaler 2  . ALPRAZolam (XANAX) 0.25 MG tablet Take 1 tablet (0.25 mg total) by mouth 2 (two) times daily as needed for anxiety. 20 tablet 1  . aspirin 81 MG tablet Take 81 mg by mouth daily.    Marland Kitchen buPROPion (WELLBUTRIN SR) 150 MG 12 hr tablet one tablet by mouth twice daily 60 tablet 2  . carisoprodol (SOMA) 350 MG tablet Take 1 tablet (350 mg total) by mouth 4 (four) times daily as needed for muscle spasms. 30 tablet 0  . HYDROcodone-acetaminophen (NORCO) 7.5-325 MG tablet Take 2 tablets by mouth 2 (two) times daily as needed. 60 tablet 0  . KRILL OIL PO Take 1 capsule by mouth daily.    . Misc. Devices (ROLLATOR) MISC Patient with severe pain and weakness in left leg Reflex Sympathetic Dystrophy 1 each 0  . nitroGLYCERIN (NITROSTAT) 0.4 MG SL tablet Place 1 tablet (0.4 mg total) under the tongue every 5 (five) minutes as needed for chest pain. 25 tablet 1  . omeprazole (PRILOSEC) 40 MG capsule Take 1 capsule (40 mg total) by mouth daily. 90 capsule 1  . Potassium 75 MG TABS Take 1 each by mouth at bedtime.    . pramipexole (MIRAPEX) 1 MG tablet Take 1 tablet (1 mg total) by mouth at bedtime. 30 tablet 2  . ranitidine (ZANTAC) 300 MG tablet Take 1 tablet (300 mg total) by mouth daily as needed for heartburn.  90 tablet 1  . escitalopram (LEXAPRO) 10 MG tablet Take 1 tablet (10 mg total) by mouth daily. (Patient not taking: Reported on 08/11/2016) 30 tablet 2   No current facility-administered medications on file prior to visit.     Allergies  Allergen Reactions  . Codeine Nausea Only    Family History  Problem Relation Age of Onset  . Arthritis Mother     Living  . Hypertension Mother   . Thrombocytopenia Mother   . Hyperlipidemia Father   . Hypertension Father   . Thrombocytopenia Maternal Grandmother   . Stroke Maternal  Grandmother   . Hypertension Maternal Grandmother   . Alcohol abuse Maternal Grandfather   . Heart disease Maternal Grandfather   . Parkinson's disease Maternal Grandfather   . Alcohol abuse Paternal Grandfather   . Cancer Paternal Grandfather   . Hypothyroidism Daughter   . Diabetes Daughter     type 1  . Dementia Paternal Grandmother     Social History   Social History  . Marital status: Widowed    Spouse name: N/A  . Number of children: 3  . Years of education: N/A   Occupational History  . Not on file.   Social History Main Topics  . Smoking status: Current Every Day Smoker    Types: Cigarettes  . Smokeless tobacco: Not on file  . Alcohol use Yes     Comment: once a year.  . Drug use: No  . Sexual activity: Not on file     Comment: lives with son, 60 yo daughter, boyfriend, no dietary   Other Topics Concern  . Not on file   Social History Narrative  . No narrative on file   Review of Systems  Constitutional: Negative for fever and weight loss.  HENT: Negative for ear discharge, ear pain, hearing loss and tinnitus.   Eyes: Negative for blurred vision, double vision, photophobia and pain.  Respiratory: Negative for cough and shortness of breath.   Cardiovascular: Negative for chest pain and palpitations.  Gastrointestinal: Positive for abdominal pain, heartburn, nausea and vomiting. Negative for blood in stool, constipation, diarrhea and melena.  Genitourinary: Negative for dysuria, flank pain, frequency, hematuria and urgency.  Musculoskeletal: Negative for falls.  Neurological: Negative for dizziness, loss of consciousness and headaches.  Endo/Heme/Allergies: Negative for environmental allergies.  Psychiatric/Behavioral: Negative for depression, hallucinations, substance abuse and suicidal ideas. The patient is not nervous/anxious and does not have insomnia.    BP 124/72 (BP Location: Right Arm, Patient Position: Sitting, Cuff Size: Large)   Pulse 91   Temp  98.2 F (36.8 C) (Oral)   Ht _0  (1.702 m)   Wt 281 lb (127.5 kg)   SpO2 97%   BMI 44.01 kg/m   Physical Exam  Constitutional: She is oriented to person, place, and time and well-developed, well-nourished, and in no distress.  HENT:  Head: Normocephalic and atraumatic.  Right Ear: Tympanic membrane, external ear and ear canal normal.  Left Ear: Tympanic membrane, external ear and ear canal normal.  Nose: Nose normal. No mucosal edema.  Mouth/Throat: Uvula is midline, oropharynx is clear and moist and mucous membranes are normal. No oropharyngeal exudate or posterior oropharyngeal erythema.  Eyes: Conjunctivae are normal. Pupils are equal, round, and reactive to light.  Neck: Neck supple. No thyromegaly present.  Cardiovascular: Normal rate, regular rhythm, normal heart sounds and intact distal pulses.   Pulmonary/Chest: Effort normal and breath sounds normal. No respiratory distress. She has no wheezes. She  has no rales.  Abdominal: Soft. Bowel sounds are normal. She exhibits no distension and no mass. There is no tenderness. There is no rebound and no guarding.  Lymphadenopathy:    She has no cervical adenopathy.  Neurological: She is alert and oriented to person, place, and time. No cranial nerve deficit.  Skin: Skin is warm and dry. No rash noted.  Psychiatric: Affect normal.  Vitals reviewed.  Assessment/Plan: 1. Mid back pain on right side Medications refilled. FU with PCP and specialist as scheduled. - carisoprodol (SOMA) 350 MG tablet; Take 1 tablet (350 mg total) by mouth 4 (four) times daily as needed for muscle spasms.  Dispense: 30 tablet; Refill: 0 - ALPRAZolam (XANAX) 0.25 MG tablet; Take 1 tablet (0.25 mg total) by mouth 2 (two) times daily as needed for anxiety.  Dispense: 20 tablet; Refill: 1 - HYDROcodone-acetaminophen (NORCO) 7.5-325 MG tablet; Take 2 tablets by mouth 2 (two) times daily as needed.  Dispense: 60 tablet; Refill: 0 - CBC - Comp Met (CMET) - H.  pylori breath test - Hemoglobin A1c - Lipase - TSH - Urinalysis  2. Gastroesophageal reflux disease, esophagitis presence not specified Prilosec refilled. Patient to start Prilosec and ranitidine as directed without fail. Start probiotic. GERD diet reviewed. Referral placed to GI.  - omeprazole (PRILOSEC) 40 MG capsule; Take 1 capsule (40 mg total) by mouth daily.  Dispense: 90 capsule; Refill: 1 - ranitidine (ZANTAC) 300 MG tablet; Take 1 tablet (300 mg total) by mouth daily as needed for heartburn.  Dispense: 90 tablet; Refill: 1 - CBC - Comp Met (CMET) - H. pylori breath test - Hemoglobin A1c - Lipase - TSH - Urinalysis  3. Tobacco abuse disorder Wellbutrin refilled. Counseling reviewed. Will continue current regimen. FU with PCP as scheduled. - buPROPion (WELLBUTRIN SR) 150 MG 12 hr tablet; one tablet by mouth twice daily  Dispense: 60 tablet; Refill: 2 - CBC - Comp Met (CMET) - H. pylori breath test - Hemoglobin A1c - Lipase - TSH - Urinalysis  4. Visit for preventive health examination Depression screen negative. Health Maintenance reviewed -- Declines flu shot. Tetanus up-to-date. PAP up-to-date. Preventive schedule discussed and handout given in AVS. Will obtain fasting labs today.  - CBC - Comp Met (CMET) - H. pylori breath test - Hemoglobin A1c - Lipase - TSH - Urinalysis  5. Breast cancer screening Order for screening 3D mammogram placed. - MM SCREENING BREAST TOMO BILATERAL; Future   6. Abdominal pain, chronic, epigastric Will obtain lab assessment today. Continue GERD management. Probiotic daily. Referral to GI placed today. - Ambulatory referral to Gastroenterology - CBC - Comp Met (CMET) - H. pylori breath test - Hemoglobin A1c - Lipase - TSH - Urinalysis  7. RSD (reflex sympathetic dystrophy) Chronic. Continue current medication regimen. Patient doing well at present. FU with PCP as scheduled. - HYDROcodone-acetaminophen (NORCO) 7.5-325 MG  tablet; Take 2 tablets by mouth 2 (two) times daily as needed.  Dispense: 60 tablet; Refill: 0 - CBC - Comp Met (CMET) - H. pylori breath test - Hemoglobin A1c - Lipase - TSH - Urinalysis  8. Apneic Episode Referral to Pulmonology placed for assessment and to set up sleep study.  Leeanne Rio, PA-C

## 2016-08-11 NOTE — Progress Notes (Signed)
Pre visit review using our clinic review tool, if applicable. No additional management support is needed unless otherwise documented below in the visit note. 

## 2016-08-12 LAB — URINALYSIS
Bilirubin Urine: NEGATIVE
Glucose, UA: NEGATIVE
Ketones, ur: NEGATIVE
LEUKOCYTES UA: NEGATIVE
Nitrite: NEGATIVE
PROTEIN: NEGATIVE
Specific Gravity, Urine: 1.026 (ref 1.001–1.035)
pH: 5 (ref 5.0–8.0)

## 2016-08-12 LAB — LIPASE: Lipase: 21 U/L (ref 7–60)

## 2016-08-13 ENCOUNTER — Encounter: Payer: Self-pay | Admitting: Physician Assistant

## 2016-08-13 DIAGNOSIS — R0681 Apnea, not elsewhere classified: Secondary | ICD-10-CM | POA: Insufficient documentation

## 2016-08-13 DIAGNOSIS — Z0000 Encounter for general adult medical examination without abnormal findings: Secondary | ICD-10-CM | POA: Insufficient documentation

## 2016-08-13 DIAGNOSIS — Z1239 Encounter for other screening for malignant neoplasm of breast: Secondary | ICD-10-CM | POA: Insufficient documentation

## 2016-08-13 DIAGNOSIS — Z720 Tobacco use: Secondary | ICD-10-CM | POA: Insufficient documentation

## 2016-08-13 DIAGNOSIS — R1013 Epigastric pain: Secondary | ICD-10-CM

## 2016-08-13 DIAGNOSIS — G8929 Other chronic pain: Secondary | ICD-10-CM | POA: Insufficient documentation

## 2016-08-14 LAB — H. PYLORI BREATH TEST: H. PYLORI BREATH TEST: NOT DETECTED

## 2016-08-21 ENCOUNTER — Encounter: Payer: Self-pay | Admitting: Internal Medicine

## 2016-08-29 ENCOUNTER — Ambulatory Visit: Payer: Self-pay | Admitting: Internal Medicine

## 2016-09-04 ENCOUNTER — Encounter: Payer: Self-pay | Admitting: Physician Assistant

## 2016-09-11 ENCOUNTER — Encounter: Payer: Self-pay | Admitting: Family Medicine

## 2016-10-03 ENCOUNTER — Ambulatory Visit: Payer: Self-pay | Admitting: Family Medicine

## 2016-10-13 ENCOUNTER — Institutional Professional Consult (permissible substitution): Payer: Self-pay | Admitting: Pulmonary Disease

## 2016-10-26 ENCOUNTER — Ambulatory Visit: Payer: Medicare Other | Admitting: Family Medicine

## 2016-10-26 MED FILL — OMEPRAZOLE DR 40 MG CAPSULE: 40 | 30 days supply | Qty: 30 | Fill #0

## 2016-10-31 ENCOUNTER — Other Ambulatory Visit: Payer: Self-pay | Admitting: Family Medicine

## 2016-10-31 DIAGNOSIS — K219 Gastro-esophageal reflux disease without esophagitis: Secondary | ICD-10-CM

## 2016-10-31 DIAGNOSIS — R3589 Other polyuria: Secondary | ICD-10-CM

## 2016-10-31 DIAGNOSIS — R631 Polydipsia: Secondary | ICD-10-CM

## 2016-10-31 DIAGNOSIS — K2100 Gastro-esophageal reflux disease with esophagitis, without bleeding: Secondary | ICD-10-CM

## 2016-10-31 DIAGNOSIS — R358 Other polyuria: Secondary | ICD-10-CM

## 2016-10-31 DIAGNOSIS — R000 Tachycardia, unspecified: Secondary | ICD-10-CM

## 2016-10-31 DIAGNOSIS — R0602 Shortness of breath: Secondary | ICD-10-CM

## 2016-10-31 DIAGNOSIS — E785 Hyperlipidemia, unspecified: Secondary | ICD-10-CM

## 2016-10-31 MED ORDER — ALBUTEROL SULFATE HFA 108 (90 BASE) MCG/ACT IN AERS: 2.0000 | INHALATION_SPRAY | Freq: Four times a day (QID) | RESPIRATORY_TRACT | 2 refills | Status: DC | PRN

## 2016-11-17 ENCOUNTER — Encounter: Payer: Self-pay | Admitting: Family Medicine

## 2016-11-17 ENCOUNTER — Ambulatory Visit (HOSPITAL_BASED_OUTPATIENT_CLINIC_OR_DEPARTMENT_OTHER)
Admission: RE | Admit: 2016-11-17 | Discharge: 2016-11-17 | Disposition: A | Discharge status: Home / Self Care | Payer: Medicare Other | Source: Ambulatory Visit | Attending: Family Medicine | Admitting: Family Medicine

## 2016-11-17 ENCOUNTER — Ambulatory Visit (INDEPENDENT_AMBULATORY_CARE_PROVIDER_SITE_OTHER): Payer: Medicare Other | Admitting: Family Medicine

## 2016-11-17 DIAGNOSIS — M542 Cervicalgia: Secondary | ICD-10-CM

## 2016-11-17 DIAGNOSIS — H673 Otitis media in diseases classified elsewhere, bilateral: Secondary | ICD-10-CM | POA: Diagnosis not present

## 2016-11-17 DIAGNOSIS — R739 Hyperglycemia, unspecified: Secondary | ICD-10-CM

## 2016-11-17 DIAGNOSIS — Z720 Tobacco use: Secondary | ICD-10-CM | POA: Diagnosis not present

## 2016-11-17 DIAGNOSIS — E6609 Other obesity due to excess calories: Secondary | ICD-10-CM

## 2016-11-17 DIAGNOSIS — E782 Mixed hyperlipidemia: Secondary | ICD-10-CM

## 2016-11-17 DIAGNOSIS — H6690 Otitis media, unspecified, unspecified ear: Secondary | ICD-10-CM

## 2016-11-17 DIAGNOSIS — M549 Dorsalgia, unspecified: Secondary | ICD-10-CM

## 2016-11-17 HISTORY — DX: Otitis media, unspecified, unspecified ear: H66.90

## 2016-11-17 HISTORY — DX: Cervicalgia: M54.2

## 2016-11-17 MED ORDER — AMOXICILLIN 500 MG PO CAPS: 500.0000 mg | ORAL_CAPSULE | Freq: Three times a day (TID) | ORAL | 0 refills | Status: DC

## 2016-11-17 MED ORDER — ALPRAZOLAM 0.25 MG PO TABS: 0.2500 mg | ORAL_TABLET | Freq: Two times a day (BID) | ORAL | 1 refills | Status: DC | PRN

## 2016-11-17 MED FILL — AMOXICILLIN 500 MG CAPSULE: 500 | 10 days supply | Qty: 30 | Fill #0

## 2016-11-17 MED FILL — VENTOLIN HFA 90 MCG INHALER: 108 (90 BAS | 30 days supply | Qty: 18 | Fill #0

## 2016-11-17 NOTE — Patient Instructions (Signed)

## 2016-11-17 NOTE — Assessment & Plan Note (Signed)
Encouraged complete cessation. Discussed need to quit as relates to risk of numerous cancers, cardiac and pulmonary disease as well as neurologic complications. Counseled for greater than 3 minutes 

## 2016-11-17 NOTE — Progress Notes (Signed)
Patient ID: Beverly Erickson, female   DOB: Oct 30, 1970, 46 y.o.   MRN: [id number]   Subjective:    Patient ID: Beverly Erickson, female    DOB: 1971-08-21, 46 y.o.   MRN: [id number]  Chief Complaint  Patient presents with  . Follow-up  . Headache  I acted as a Education administrator for Dr. Charlett Blake. [name], RMA   Headache   This is a new problem. The current episode started in the past 7 days. The problem occurs constantly. Associated symptoms include ear pain and photophobia. Pertinent negatives include no back pain, blurred vision, coughing, dizziness, eye pain, eye redness, fever, tingling or vomiting.    Patient is in today for follow up appointment patient complains of headache that radiate to her R chest. She has a very stiff neck notably on the right side and this causes spasm at the occiput and then generalized headache. Also notes some spasm into muscles in anterior upper chest wall. No falls but she does note significant stress and tension. Denies CP/palp/SOB/HA/congestion/fevers/GI or GU c/o. Taking meds as prescribed  Past Medical History:  Diagnosis Date  . Anemia   . Chicken pox   . Depression   . Depression with anxiety 08/30/2015  . GERD (gastroesophageal reflux disease)   . Kidney stone 02/13/2014   Right, small  . Neck pain, musculoskeletal 11/17/2016  . Obesity   . Other and unspecified hyperlipidemia 01/14/2014  . Otitis media 11/17/2016  . Preventative health care 07/05/2014  . RSD (reflex sympathetic dystrophy)   . Tobacco use disorder 01/14/2014   1ppd  . Unspecified constipation 05/25/2014    Past Surgical History:  Procedure Laterality Date  . CESAREAN SECTION  2000  . plate and screws in left arm  2005   humerus  . WISDOM TOOTH EXTRACTION  46 yrs old    Family History  Problem Relation Age of Onset  . Arthritis Mother     Living  . Hypertension Mother   . Thrombocytopenia Mother   . Hyperlipidemia Father   . Hypertension Father   . Thrombocytopenia Maternal Grandmother   .  Stroke Maternal Grandmother   . Hypertension Maternal Grandmother   . Alcohol abuse Maternal Grandfather   . Heart disease Maternal Grandfather   . Parkinson's disease Maternal Grandfather   . Alcohol abuse Paternal Grandfather   . Cancer Paternal Grandfather   . Hypothyroidism Daughter   . Diabetes Daughter     type 1  . Dementia Paternal Grandmother     Social History   Social History  . Marital status: Widowed    Spouse name: N/A  . Number of children: 3  . Years of education: N/A   Occupational History  . Not on file.   Social History Main Topics  . Smoking status: Current Every Day Smoker    Types: Cigarettes  . Smokeless tobacco: Never Used  . Alcohol use Yes     Comment: once a year.  . Drug use: No  . Sexual activity: Not on file     Comment: lives with son, 79 yo daughter, boyfriend, no dietary   Other Topics Concern  . Not on file   Social History Narrative  . No narrative on file    Outpatient Medications Prior to Visit  Medication Sig Dispense Refill  . albuterol (PROVENTIL HFA;VENTOLIN HFA) 108 (90 Base) MCG/ACT inhaler Inhale 2 puffs into the lungs every 6 (six) hours as needed for wheezing or shortness of breath. 1 Inhaler 2  . aspirin 81  MG tablet Take 81 mg by mouth daily.    Marland Kitchen buPROPion (WELLBUTRIN SR) 150 MG 12 hr tablet one tablet by mouth twice daily 60 tablet 2  . carisoprodol (SOMA) 350 MG tablet Take 1 tablet (350 mg total) by mouth 4 (four) times daily as needed for muscle spasms. 30 tablet 0  . HYDROcodone-acetaminophen (NORCO) 7.5-325 MG tablet Take 2 tablets by mouth 2 (two) times daily as needed. 60 tablet 0  . KRILL OIL PO Take 1 capsule by mouth daily.    . Misc. Devices (ROLLATOR) MISC Patient with severe pain and weakness in left leg Reflex Sympathetic Dystrophy 1 each 0  . nitroGLYCERIN (NITROSTAT) 0.4 MG SL tablet Place 1 tablet (0.4 mg total) under the tongue every 5 (five) minutes as needed for chest pain. 25 tablet 1  .  omeprazole (PRILOSEC) 40 MG capsule Take 1 capsule (40 mg total) by mouth daily. 90 capsule 1  . Potassium 75 MG TABS Take 1 each by mouth at bedtime.    . pramipexole (MIRAPEX) 1 MG tablet Take 1 tablet (1 mg total) by mouth at bedtime. 30 tablet 2  . ranitidine (ZANTAC) 300 MG tablet Take 1 tablet (300 mg total) by mouth daily as needed for heartburn. 90 tablet 1  . ALPRAZolam (XANAX) 0.25 MG tablet Take 1 tablet (0.25 mg total) by mouth 2 (two) times daily as needed for anxiety. 20 tablet 1  . escitalopram (LEXAPRO) 10 MG tablet Take 1 tablet (10 mg total) by mouth daily. (Patient not taking: Reported on 08/11/2016) 30 tablet 2   No facility-administered medications prior to visit.     Allergies  Allergen Reactions  . Codeine Nausea Only    Review of Systems  Constitutional: Negative for fever and malaise/fatigue.  HENT: Positive for congestion and ear pain.   Eyes: Positive for photophobia. Negative for blurred vision, double vision, pain, discharge and redness.  Respiratory: Positive for shortness of breath. Negative for cough.   Cardiovascular: Negative for chest pain, palpitations and leg swelling.  Gastrointestinal: Negative for vomiting.  Musculoskeletal: Negative for back pain.  Skin: Negative for rash.  Neurological: Positive for headaches. Negative for dizziness, tingling and loss of consciousness.       Objective:    Physical Exam  Constitutional: She is oriented to person, place, and time. She appears well-developed and well-nourished. No distress.  HENT:  Head: Normocephalic and atraumatic.  TMs midly erythematous.   Eyes: Conjunctivae are normal.  Neck: Normal range of motion. No thyromegaly present.  Cardiovascular: Normal rate and regular rhythm.   Pulmonary/Chest: Effort normal and breath sounds normal. She has no wheezes.  Abdominal: Soft. Bowel sounds are normal. There is no tenderness.  Musculoskeletal: Normal range of motion. She exhibits tenderness. She  exhibits no edema or deformity.  Pain with palp over right SCM muscle.   Neurological: She is alert and oriented to person, place, and time.  Skin: Skin is warm and dry. She is not diaphoretic.  Psychiatric: She has a normal mood and affect.    BP 125/62 (BP Location: Right Arm, Patient Position: Sitting, Cuff Size: Large)   Pulse 81   Temp 98.2 F (36.8 C) (Oral)   Wt 289 lb (131.1 kg)   SpO2 98%   BMI 45.26 kg/m  Wt Readings from Last 3 Encounters:  11/17/16 289 lb (131.1 kg)  08/11/16 281 lb (127.5 kg)  04/14/16 280 lb 2 oz (127.1 kg)     Lab Results  Component Value Date  WBC 7.0 08/11/2016   HGB 14.7 08/11/2016   HCT 43.6 08/11/2016   PLT 265 08/11/2016   GLUCOSE 98 08/11/2016   CHOL 153 01/21/2015   TRIG 62.0 01/21/2015   HDL 37.60 (L) 01/21/2015   LDLCALC 103 (H) 01/21/2015   ALT 19 08/11/2016   AST 15 08/11/2016   NA 140 08/11/2016   K 4.1 08/11/2016   CL 109 08/11/2016   CREATININE 0.70 08/11/2016   BUN 12 08/11/2016   CO2 23 08/11/2016   TSH 1.51 08/11/2016   HGBA1C 5.1 08/11/2016    Lab Results  Component Value Date   TSH 1.51 08/11/2016   Lab Results  Component Value Date   WBC 7.0 08/11/2016   HGB 14.7 08/11/2016   HCT 43.6 08/11/2016   MCV 90.3 08/11/2016   PLT 265 08/11/2016   Lab Results  Component Value Date   NA 140 08/11/2016   K 4.1 08/11/2016   CO2 23 08/11/2016   GLUCOSE 98 08/11/2016   BUN 12 08/11/2016   CREATININE 0.70 08/11/2016   BILITOT 0.3 08/11/2016   ALKPHOS 61 08/11/2016   AST 15 08/11/2016   ALT 19 08/11/2016   PROT 6.6 08/11/2016   ALBUMIN 4.0 08/11/2016   CALCIUM 8.9 08/11/2016   GFR 80.92 01/21/2015   Lab Results  Component Value Date   CHOL 153 01/21/2015   Lab Results  Component Value Date   HDL 37.60 (L) 01/21/2015   Lab Results  Component Value Date   LDLCALC 103 (H) 01/21/2015   Lab Results  Component Value Date   TRIG 62.0 01/21/2015   Lab Results  Component Value Date   CHOLHDL 4  01/21/2015   Lab Results  Component Value Date   HGBA1C 5.1 08/11/2016       Assessment & Plan:   Problem List Items Addressed This Visit    Obesity    Encouraged DASH diet, decrease po intake and increase exercise as tolerated. Needs 7-8 hours of sleep nightly. Avoid trans fats, eat small, frequent meals every 4-5 hours with lean proteins, complex carbs and healthy fats. Minimize simple carbs. Encouraged to consider bariatric consultation.       Hyperglycemia    minimize simple carbs. Increase exercise as tolerated.       Hyperlipidemia, mixed    Encouraged heart healthy diet, increase exercise, avoid trans fats, consider a krill oil cap daily      Mid back pain on right side   Relevant Medications   ALPRAZolam (XANAX) 0.25 MG tablet   Tobacco abuse disorder    Encouraged complete cessation. Discussed need to quit as relates to risk of numerous cancers, cardiac and pulmonary disease as well as neurologic complications. Counseled for greater than 3 minutes      Neck pain, musculoskeletal    Encouraged moist heat and gentle stretching as tolerated. May try NSAIDs and prescription meds as directed and report if symptoms worsen or seek immediate care. Topical lidocaine, soma and report if no improvement      Relevant Orders   DG Cervical Spine 2 or 3 views (Completed)   Otitis media    Amoxicillin, probiotic, mucinex report if symptoms worsen      Relevant Medications   amoxicillin (AMOXIL) 500 MG capsule      I have discontinued Ms. Barabas's escitalopram. I am also having her start on amoxicillin. Additionally, I am having her maintain her Potassium, aspirin, KRILL OIL PO, ROLLATOR, nitroGLYCERIN, omeprazole, ranitidine, pramipexole, carisoprodol, buPROPion, HYDROcodone-acetaminophen, albuterol, and  ALPRAZolam.  Meds ordered this encounter  Medications  . ALPRAZolam (XANAX) 0.25 MG tablet    Sig: Take 1 tablet (0.25 mg total) by mouth 2 (two) times daily as needed for  anxiety.    Dispense:  20 tablet    Refill:  1  . amoxicillin (AMOXIL) 500 MG capsule    Sig: Take 1 capsule (500 mg total) by mouth 3 (three) times daily.    Dispense:  30 capsule    Refill:  0    CMA served as scribe during this visit. History, Physical and Plan performed by medical provider. Documentation and orders reviewed and attested to.  Penni Homans, MD

## 2016-11-17 NOTE — Progress Notes (Signed)
Pre visit review using our clinic review tool, if applicable. No additional management support is needed unless otherwise documented below in the visit note. 

## 2016-11-17 NOTE — Assessment & Plan Note (Signed)
Encouraged moist heat and gentle stretching as tolerated. May try NSAIDs and prescription meds as directed and report if symptoms worsen or seek immediate care. Topical lidocaine, soma and report if no improvement

## 2016-11-17 NOTE — Assessment & Plan Note (Signed)
Amoxicillin, probiotic, mucinex report if symptoms worsen

## 2016-11-19 NOTE — Assessment & Plan Note (Signed)
Encouraged heart healthy diet, increase exercise, avoid trans fats, consider a krill oil cap daily 

## 2016-11-19 NOTE — Assessment & Plan Note (Signed)
minimize simple carbs. Increase exercise as tolerated.  

## 2016-11-19 NOTE — Assessment & Plan Note (Signed)
Encouraged DASH diet, decrease po intake and increase exercise as tolerated. Needs 7-8 hours of sleep nightly. Avoid trans fats, eat small, frequent meals every 4-5 hours with lean proteins, complex carbs and healthy fats. Minimize simple carbs. Encouraged to consider bariatric consultation.

## 2016-11-21 DIAGNOSIS — Z79899 Other long term (current) drug therapy: Secondary | ICD-10-CM | POA: Diagnosis not present

## 2016-11-23 ENCOUNTER — Encounter: Payer: Self-pay | Admitting: Family Medicine

## 2017-01-18 DIAGNOSIS — J029 Acute pharyngitis, unspecified: Secondary | ICD-10-CM | POA: Diagnosis not present

## 2017-01-26 MED FILL — AMOXICILLIN 500 MG CAPSULE: 500 | 10 days supply | Qty: 20 | Fill #0

## 2017-02-16 ENCOUNTER — Ambulatory Visit: Payer: Self-pay | Admitting: Family Medicine

## 2017-03-06 ENCOUNTER — Ambulatory Visit (INDEPENDENT_AMBULATORY_CARE_PROVIDER_SITE_OTHER): Payer: Medicare Other | Admitting: Family Medicine

## 2017-03-06 ENCOUNTER — Encounter: Payer: Self-pay | Admitting: Family Medicine

## 2017-03-06 VITALS — BP 104/66 | HR 87 | Temp 98.5°F | Wt 292.4 lb

## 2017-03-06 DIAGNOSIS — M549 Dorsalgia, unspecified: Secondary | ICD-10-CM

## 2017-03-06 DIAGNOSIS — R350 Frequency of micturition: Secondary | ICD-10-CM | POA: Diagnosis not present

## 2017-03-06 DIAGNOSIS — R739 Hyperglycemia, unspecified: Secondary | ICD-10-CM

## 2017-03-06 DIAGNOSIS — E6609 Other obesity due to excess calories: Secondary | ICD-10-CM | POA: Diagnosis not present

## 2017-03-06 DIAGNOSIS — M545 Low back pain: Secondary | ICD-10-CM | POA: Diagnosis not present

## 2017-03-06 DIAGNOSIS — N200 Calculus of kidney: Secondary | ICD-10-CM

## 2017-03-06 DIAGNOSIS — E782 Mixed hyperlipidemia: Secondary | ICD-10-CM | POA: Diagnosis not present

## 2017-03-06 DIAGNOSIS — K219 Gastro-esophageal reflux disease without esophagitis: Secondary | ICD-10-CM

## 2017-03-06 DIAGNOSIS — R252 Cramp and spasm: Secondary | ICD-10-CM

## 2017-03-06 DIAGNOSIS — R1013 Epigastric pain: Secondary | ICD-10-CM | POA: Diagnosis not present

## 2017-03-06 DIAGNOSIS — K5900 Constipation, unspecified: Secondary | ICD-10-CM

## 2017-03-06 HISTORY — DX: Frequency of micturition: R35.0

## 2017-03-06 MED ORDER — CARISOPRODOL 350 MG PO TABS: 350.0000 mg | ORAL_TABLET | Freq: Two times a day (BID) | ORAL | Status: DC | PRN

## 2017-03-06 MED ORDER — RANITIDINE HCL 300 MG PO TABS: 300.0000 mg | ORAL_TABLET | Freq: Every day | ORAL | 1 refills | Status: DC | PRN

## 2017-03-06 MED ORDER — OMEPRAZOLE 40 MG PO CPDR: 40.0000 mg | DELAYED_RELEASE_CAPSULE | Freq: Two times a day (BID) | ORAL | 1 refills | Status: DC

## 2017-03-06 MED ORDER — ALPRAZOLAM 0.25 MG PO TABS: 0.2500 mg | ORAL_TABLET | Freq: Two times a day (BID) | ORAL | 1 refills | Status: DC | PRN

## 2017-03-06 MED FILL — OMEPRAZOLE DR 40 MG CAPSULE: 40 | 90 days supply | Qty: 180 | Fill #0

## 2017-03-06 MED FILL — CARISOPRODOL 350 MG TABLET: 350 | 30 days supply | Qty: 60 | Fill #0

## 2017-03-06 MED FILL — raNITIdine HCL 300 MG TABS: 300 | 90 days supply | Qty: 90 | Fill #0

## 2017-03-06 MED FILL — ALPRAZolam 0.25 MG TABS: 0.25 | 10 days supply | Qty: 20 | Fill #0

## 2017-03-06 NOTE — Progress Notes (Signed)
Patient ID: Beverly Erickson, female   DOB: 1971/03/17, 46 y.o.   MRN: [id number]   Subjective:  I acted as a Education administrator for Penni Homans, [place], [place]   Patient ID: Beverly Erickson, female    DOB: 11-06-1970, 46 y.o.   MRN: [id number]  Chief Complaint  Patient presents with  . Neck Pain    38-monthF/U.  .Marland KitchenBack Pain    x1 week. Periodic, dull, achy pain.     Neck Pain   This is a chronic problem. The problem has been waxing and waning. The pain is moderate. Pertinent negatives include no chest pain, fever or headaches.  Back Pain  This is a new problem. The current episode started in the past 7 days. The pain is present in the lumbar spine. The quality of the pain is described as aching. The pain does not radiate. The pain is at a severity of 6/10. The pain is moderate. The pain is the same all the time. Stiffness is present all day. Associated symptoms include abdominal pain. Pertinent negatives include no chest pain, fever or headaches. Risk factors include obesity. She has tried bed rest and NSAIDs for the symptoms. The treatment provided no relief.    Patient is in today for a 322-monthollow up on neck pain. Patient states that the problem is ongoing. Also has a complaint of lower back pain that has been going on for the past month. Patient states that the pain is periodic, yet dull and achy. Has also asked for a Handicapped Placard. Patient has a Hx of TIA, RSD, kidney stone, mixed hyperlipidemia, hyperglycemia. She endorses increased b/l low and mid back pain, constipation. Nausea and malaise. Denies CP/palp/SOB/HA/congestion/fevers. Notes some urinary frequency. Taking meds as prescribed  Patient Care Team: BlMosie LukesMD as PCP - General (Family Medicine)   Past Medical History:  Diagnosis Date  . Anemia   . Chicken pox   . Depression   . Depression with anxiety 08/30/2015  . GERD (gastroesophageal reflux disease)   . Kidney stone 02/13/2014   Right, small  . Neck pain,  musculoskeletal 11/17/2016  . Obesity   . Other and unspecified hyperlipidemia 01/14/2014  . Otitis media 11/17/2016  . Preventative health care 07/05/2014  . RSD (reflex sympathetic dystrophy)   . Tobacco use disorder 01/14/2014   1ppd  . Unspecified constipation 05/25/2014  . Urinary frequency 03/06/2017    Past Surgical History:  Procedure Laterality Date  . CESAREAN SECTION  2000  . plate and screws in left arm  2005   humerus  . WISDOM TOOTH EXTRACTION  [age]rs old    Family History  Problem Relation Age of Onset  . Arthritis Mother        Living  . Hypertension Mother   . Thrombocytopenia Mother   . Hyperlipidemia Father   . Hypertension Father   . Thrombocytopenia Maternal Grandmother   . Stroke Maternal Grandmother   . Hypertension Maternal Grandmother   . Alcohol abuse Maternal Grandfather   . Heart disease Maternal Grandfather   . Parkinson's disease Maternal Grandfather   . Alcohol abuse Paternal Grandfather   . Cancer Paternal Grandfather   . Hypothyroidism Daughter   . Diabetes Daughter        type 1  . Dementia Paternal Grandmother     Social History   Social History  . Marital status: Widowed    Spouse name: N/A  . Number of children: 3  . Years of education:  N/A   Occupational History  . Not on file.   Social History Main Topics  . Smoking status: Current Every Day Smoker    Types: Cigarettes  . Smokeless tobacco: Never Used  . Alcohol use Yes     Comment: once a year.  . Drug use: No  . Sexual activity: Not on file     Comment: lives with son, 84 yo daughter, boyfriend, no dietary   Other Topics Concern  . Not on file   Social History Narrative  . No narrative on file    Outpatient Medications Prior to Visit  Medication Sig Dispense Refill  . albuterol (PROVENTIL HFA;VENTOLIN HFA) 108 (90 Base) MCG/ACT inhaler Inhale 2 puffs into the lungs every 6 (six) hours as needed for wheezing or shortness of breath. 1 Inhaler 2  . aspirin 81 MG  tablet Take 81 mg by mouth daily.    Marland Kitchen buPROPion (WELLBUTRIN SR) 150 MG 12 hr tablet one tablet by mouth twice daily 60 tablet 2  . HYDROcodone-acetaminophen (NORCO) 7.5-325 MG tablet Take 2 tablets by mouth 2 (two) times daily as needed. 60 tablet 0  . KRILL OIL PO Take 1 capsule by mouth daily.    . Misc. Devices (ROLLATOR) MISC Patient with severe pain and weakness in left leg Reflex Sympathetic Dystrophy 1 each 0  . nitroGLYCERIN (NITROSTAT) 0.4 MG SL tablet Place 1 tablet (0.4 mg total) under the tongue every 5 (five) minutes as needed for chest pain. 25 tablet 1  . Potassium 75 MG TABS Take 1 each by mouth at bedtime.    . pramipexole (MIRAPEX) 1 MG tablet Take 1 tablet (1 mg total) by mouth at bedtime. 30 tablet 2  . ALPRAZolam (XANAX) 0.25 MG tablet Take 1 tablet (0.25 mg total) by mouth 2 (two) times daily as needed for anxiety. 20 tablet 1  . amoxicillin (AMOXIL) 500 MG capsule Take 1 capsule (500 mg total) by mouth 3 (three) times daily. 30 capsule 0  . carisoprodol (SOMA) 350 MG tablet Take 1 tablet (350 mg total) by mouth 4 (four) times daily as needed for muscle spasms. 30 tablet 0  . omeprazole (PRILOSEC) 40 MG capsule Take 1 capsule (40 mg total) by mouth daily. 90 capsule 1  . ranitidine (ZANTAC) 300 MG tablet Take 1 tablet (300 mg total) by mouth daily as needed for heartburn. 90 tablet 1   No facility-administered medications prior to visit.     Allergies  Allergen Reactions  . Codeine Nausea Only    Review of Systems  Constitutional: Positive for malaise/fatigue. Negative for fever.  HENT: Negative for congestion.   Eyes: Negative for blurred vision.  Respiratory: Negative for cough and shortness of breath.   Cardiovascular: Negative for chest pain, palpitations and leg swelling.  Gastrointestinal: Positive for abdominal pain, constipation and nausea. Negative for blood in stool, diarrhea, melena and vomiting.  Genitourinary: Positive for frequency.    Musculoskeletal: Positive for back pain and neck pain.  Skin: Negative for rash.  Neurological: Negative for loss of consciousness and headaches.       Objective:    Physical Exam  Constitutional: She is oriented to person, place, and time. She appears well-developed and well-nourished. No distress.  HENT:  Head: Normocephalic and atraumatic.  Eyes: Conjunctivae are normal.  Neck: Normal range of motion. No thyromegaly present.  Cardiovascular: Normal rate and regular rhythm.   Pulmonary/Chest: Effort normal and breath sounds normal. She has no wheezes.  Abdominal: Soft. Bowel sounds  are normal. There is no tenderness.  Musculoskeletal: She exhibits no edema or deformity.  Neurological: She is alert and oriented to person, place, and time.  Skin: Skin is warm and dry. She is not diaphoretic.  Psychiatric: She has a normal mood and affect.    BP 104/66 (BP Location: Left Wrist, Patient Position: Sitting, Cuff Size: Normal)   Pulse 87   Temp 98.5 F (36.9 C) (Oral)   Wt 292 lb 6.4 oz (132.6 kg)   SpO2 99% Comment: RA  BMI 45.80 kg/m  Wt Readings from Last 3 Encounters:  03/06/17 292 lb 6.4 oz (132.6 kg)  11/17/16 289 lb (131.1 kg)  08/11/16 281 lb (127.5 kg)   BP Readings from Last 3 Encounters:  03/06/17 104/66  11/17/16 125/62  08/11/16 124/72     Immunization History  Administered Date(s) Administered  . Td 10/17/2003  . Tdap 02/18/2016    Health Maintenance  Topic Date Due  . PAP SMEAR  01/17/2017  . INFLUENZA VACCINE  08/11/2017 (Originally 05/16/2017)  . TETANUS/TDAP  02/17/2026  . HIV Screening  Addressed    Lab Results  Component Value Date   WBC 6.7 03/06/2017   HGB 14.2 03/06/2017   HCT 43.2 03/06/2017   PLT 271.0 03/06/2017   GLUCOSE 103 (H) 03/06/2017   CHOL 168 03/06/2017   TRIG 69.0 03/06/2017   HDL 41.50 03/06/2017   LDLCALC 113 (H) 03/06/2017   ALT 21 03/06/2017   AST 16 03/06/2017   NA 140 03/06/2017   K 3.9 03/06/2017   CL 110  03/06/2017   CREATININE 0.76 03/06/2017   BUN 16 03/06/2017   CO2 24 03/06/2017   TSH 1.55 03/06/2017   HGBA1C 5.7 03/06/2017    Lab Results  Component Value Date   TSH 1.55 03/06/2017   Lab Results  Component Value Date   WBC 6.7 03/06/2017   HGB 14.2 03/06/2017   HCT 43.2 03/06/2017   MCV 90.3 03/06/2017   PLT 271.0 03/06/2017   Lab Results  Component Value Date   NA 140 03/06/2017   K 3.9 03/06/2017   CO2 24 03/06/2017   GLUCOSE 103 (H) 03/06/2017   BUN 16 03/06/2017   CREATININE 0.76 03/06/2017   BILITOT 0.3 03/06/2017   ALKPHOS 58 03/06/2017   AST 16 03/06/2017   ALT 21 03/06/2017   PROT 6.8 03/06/2017   ALBUMIN 4.0 03/06/2017   CALCIUM 9.2 03/06/2017   GFR 87.47 03/06/2017   Lab Results  Component Value Date   CHOL 168 03/06/2017   Lab Results  Component Value Date   HDL 41.50 03/06/2017   Lab Results  Component Value Date   LDLCALC 113 (H) 03/06/2017   Lab Results  Component Value Date   TRIG 69.0 03/06/2017   Lab Results  Component Value Date   CHOLHDL 4 03/06/2017   Lab Results  Component Value Date   HGBA1C 5.7 03/06/2017         Assessment & Plan:   Problem List Items Addressed This Visit    GERD (gastroesophageal reflux disease)    Increase the Ompeprazole to twice daily continue Raniidine qhs , minimize fatty and spicy foods and large meals in the evening. If symptoms worsen to GI for possible endosocopy.      Relevant Medications   omeprazole (PRILOSEC) 40 MG capsule   ranitidine (ZANTAC) 300 MG tablet   Abdominal pain, epigastric    Flared recently, check an ultrasound of abdomen.      Relevant  Orders   US Abdomen Complete   Obesity    Encouraged DASH diet, decrease po intake and increase exercise as tolerated. Needs 7-8 hours of sleep nightly. Avoid trans fats, eat small, frequent meals every 4-5 hours with lean proteins, complex carbs and healthy fats. Minimize simple carbs, GMO foods.      Hyperglycemia    hgba1c  acceptable, minimize simple carbs. Increase exercise as tolerated.       Relevant Orders   Hemoglobin A1c (Completed)   TSH (Completed)   Hyperlipidemia, mixed    Encouraged heart healthy diet, increase exercise, avoid trans fats, consider a krill oil cap daily      Relevant Orders   TSH (Completed)   Lipid panel (Completed)   Kidney stone    Check urine today, no hematuria      Relevant Orders   CBC with Differential/Platelet (Completed)   Comprehensive metabolic panel (Completed)   Urine culture (Completed)   Urinalysis   Mid back pain on right side   Relevant Medications   ALPRAZolam (XANAX) 0.25 MG tablet   carisoprodol (SOMA) 350 MG tablet   Other Relevant Orders   Hemoglobin A1c (Completed)   Constipation    Encouraged increased hydration and fiber in diet. Daily probiotics. If bowels not moving can use MOM 2 tbls po in 4 oz of warm prune juice by mouth every 2-3 days. If no results then repeat in 4 hours with  Dulcolax suppository pr, may repeat again in 4 more hours as needed. Seek care if symptoms worsen. Consider daily Miralax and/or Dulcolax if symptoms persist.       Urinary frequency    Urine culture negative but calcium oxalate seen suggestive of kidney stones. If pain persists will need to proceed with CT scan      Muscle cramp    Encouraged increased hydration, magnesium and Hyland's leg cramp med.       Relevant Orders   Magnesium (Completed)   Sed Rate (ESR) (Completed)   Acute bilateral low back pain - Primary    Encouraged moist heat and gentle stretching as tolerated. May try NSAIDs and prescription meds as directed and report if symptoms worsen or seek immediate care, restart Soma prn.       Relevant Medications   carisoprodol (SOMA) 350 MG tablet      I have discontinued Ms. Weesner's amoxicillin. I have also changed her omeprazole and carisoprodol. Additionally, I am having her maintain her Potassium, aspirin, KRILL OIL PO, ROLLATOR,  nitroGLYCERIN, pramipexole, buPROPion, HYDROcodone-acetaminophen, albuterol, ranitidine, and ALPRAZolam.  Meds ordered this encounter  Medications  . omeprazole (PRILOSEC) 40 MG capsule    Sig: Take 1 capsule (40 mg total) by mouth 2 (two) times daily.    Dispense:  180 capsule    Refill:  1    DISCONTINUE ALL PREVIOUS REFILLS FOR THIS MEDICATION  . ranitidine (ZANTAC) 300 MG tablet    Sig: Take 1 tablet (300 mg total) by mouth daily as needed for heartburn.    Dispense:  90 tablet    Refill:  1    DISCONTINUE ALL PREVIOUS REFILLS FOR THIS MEDICATION  . ALPRAZolam (XANAX) 0.25 MG tablet    Sig: Take 1 tablet (0.25 mg total) by mouth 2 (two) times daily as needed for anxiety.    Dispense:  20 tablet    Refill:  1  . carisoprodol (SOMA) 350 MG tablet    Sig: Take 1 tablet (350 mg total) by mouth 2 (two) times  daily as needed for muscle spasms.    Dispense:  60 tablet    Refill:  01    CMA served as scribe during this visit. History, Physical and Plan performed by medical provider. Documentation and orders reviewed and attested to.  Penni Homans, MD

## 2017-03-06 NOTE — Assessment & Plan Note (Signed)
Encouraged heart healthy diet, increase exercise, avoid trans fats, consider a krill oil cap daily 

## 2017-03-06 NOTE — Assessment & Plan Note (Signed)
Flared recently, check an ultrasound of abdomen.

## 2017-03-06 NOTE — Progress Notes (Signed)
Pre visit review using our clinic review tool, if applicable. No additional management support is needed unless otherwise documented below in the visit note. 

## 2017-03-06 NOTE — Assessment & Plan Note (Signed)
Increase the Ompeprazole to twice daily continue Raniidine qhs , minimize fatty and spicy foods and large meals in the evening. If symptoms worsen to GI for possible endosocopy.

## 2017-03-06 NOTE — Assessment & Plan Note (Signed)
Check urine today, no hematuria

## 2017-03-06 NOTE — Assessment & Plan Note (Signed)
hgba1c acceptable, minimize simple carbs. Increase exercise as tolerated.  

## 2017-03-06 NOTE — Patient Instructions (Signed)
Lidocaine patches, icy hot, salon pas and aspercreme make  Back Pain, Adult Back pain is very common in adults.The cause of back pain is rarely dangerous and the pain often gets better over time.The cause of your back pain may not be known. Some common causes of back pain include:  Strain of the muscles or ligaments supporting the spine.  Wear and tear (degeneration) of the spinal disks.  Arthritis.  Direct injury to the back. For many people, back pain may return. Since back pain is rarely dangerous, most people can learn to manage this condition on their own. Follow these instructions at home: Watch your back pain for any changes. The following actions may help to lessen any discomfort you are feeling:  Remain active. It is stressful on your back to sit or stand in one place for long periods of time. Do not sit, drive, or stand in one place for more than 30 minutes at a time. Take short walks on even surfaces as soon as you are able.Try to increase the length of time you walk each day.  Exercise regularly as directed by your health care provider. Exercise helps your back heal faster. It also helps avoid future injury by keeping your muscles strong and flexible.  Do not stay in bed.Resting more than 1-2 days can delay your recovery.  Pay attention to your body when you bend and lift. The most comfortable positions are those that put less stress on your recovering back. Always use proper lifting techniques, including:  Bending your knees.  Keeping the load close to your body.  Avoiding twisting.  Find a comfortable position to sleep. Use a firm mattress and lie on your side with your knees slightly bent. If you lie on your back, put a pillow under your knees.  Avoid feeling anxious or stressed.Stress increases muscle tension and can worsen back pain.It is important to recognize when you are anxious or stressed and learn ways to manage it, such as with exercise.  Take medicines  only as directed by your health care provider. Over-the-counter medicines to reduce pain and inflammation are often the most helpful.Your health care provider may prescribe muscle relaxant drugs.These medicines help dull your pain so you can more quickly return to your normal activities and healthy exercise.  Apply ice to the injured area:  Put ice in a plastic bag.  Place a towel between your skin and the bag.  Leave the ice on for 20 minutes, 2-3 times a day for the first 2-3 days. After that, ice and heat may be alternated to reduce pain and spasms.  Maintain a healthy weight. Excess weight puts extra stress on your back and makes it difficult to maintain good posture. Contact a health care provider if:  You have pain that is not relieved with rest or medicine.  You have increasing pain going down into the legs or buttocks.  You have pain that does not improve in one week.  You have night pain.  You lose weight.  You have a fever or chills. Get help right away if:  You develop new bowel or bladder control problems.  You have unusual weakness or numbness in your arms or legs.  You develop nausea or vomiting.  You develop abdominal pain.  You feel faint. This information is not intended to replace advice given to you by your health care provider. Make sure you discuss any questions you have with your health care provider. Document Released: 10/02/2005 Document Revised: 02/10/2016  Document Reviewed: 02/03/2014 Elsevier Interactive Patient Education  2017 [employer].

## 2017-03-06 NOTE — Assessment & Plan Note (Signed)
Encouraged DASH diet, decrease po intake and increase exercise as tolerated. Needs 7-8 hours of sleep nightly. Avoid trans fats, eat small, frequent meals every 4-5 hours with lean proteins, complex carbs and healthy fats. Minimize simple carbs, GMO foods. 

## 2017-03-07 DIAGNOSIS — M545 Low back pain, unspecified: Secondary | ICD-10-CM | POA: Insufficient documentation

## 2017-03-07 DIAGNOSIS — R252 Cramp and spasm: Secondary | ICD-10-CM | POA: Insufficient documentation

## 2017-03-07 LAB — CBC WITH DIFFERENTIAL/PLATELET
Basophils Absolute: 0.1 K/uL (ref 0.0–0.1)
Basophils Relative: 1.2 % (ref 0.0–3.0)
Eosinophils Absolute: 0.1 K/uL (ref 0.0–0.7)
Eosinophils Relative: 2.2 % (ref 0.0–5.0)
HCT: 43.2 % (ref 36.0–46.0)
Hemoglobin: 14.2 g/dL (ref 12.0–15.0)
Lymphocytes Relative: 34.8 % (ref 12.0–46.0)
Lymphs Abs: 2.3 K/uL (ref 0.7–4.0)
MCHC: 32.9 g/dL (ref 30.0–36.0)
MCV: 90.3 fl (ref 78.0–100.0)
Monocytes Absolute: 0.5 K/uL (ref 0.1–1.0)
Monocytes Relative: 7.9 % (ref 3.0–12.0)
Neutro Abs: 3.6 K/uL (ref 1.4–7.7)
Neutrophils Relative %: 53.9 % (ref 43.0–77.0)
Platelets: 271 K/uL (ref 150.0–400.0)
RBC: 4.78 Mil/uL (ref 3.87–5.11)
RDW: 13.6 % (ref 11.5–15.5)
WBC: 6.7 K/uL (ref 4.0–10.5)

## 2017-03-07 LAB — URINALYSIS, ROUTINE W REFLEX MICROSCOPIC
KETONES UR: NEGATIVE
LEUKOCYTES UA: NEGATIVE
NITRITE: NEGATIVE
RBC / HPF: NONE SEEN (ref 0–?)
Total Protein, Urine: NEGATIVE
URINE GLUCOSE: NEGATIVE
UROBILINOGEN UA: 0.2 (ref 0.0–1.0)
pH: 5.5 (ref 5.0–8.0)

## 2017-03-07 LAB — LIPID PANEL
CHOLESTEROL: 168 mg/dL (ref 0–200)
HDL: 41.5 mg/dL (ref >39.00)
LDL Cholesterol: 113 mg/dL — ABNORMAL HIGH (ref 0–99)
NONHDL: 126.99
TRIGLYCERIDES: 69 mg/dL (ref 0.0–149.0)
Total CHOL/HDL Ratio: 4
VLDL: 13.8 mg/dL (ref 0.0–40.0)

## 2017-03-07 LAB — COMPREHENSIVE METABOLIC PANEL
ALT: 21 U/L (ref 0–35)
AST: 16 U/L (ref 0–37)
Albumin: 4 g/dL (ref 3.5–5.2)
Alkaline Phosphatase: 58 U/L (ref 39–117)
BUN: 16 mg/dL (ref 6–23)
CHLORIDE: 110 meq/L (ref 96–112)
CO2: 24 meq/L (ref 19–32)
Calcium: 9.2 mg/dL (ref 8.4–10.5)
Creatinine, Ser: 0.76 mg/dL (ref 0.40–1.20)
GFR: 87.47 mL/min (ref >60.00)
GLUCOSE: 103 mg/dL — AB (ref 70–99)
POTASSIUM: 3.9 meq/L (ref 3.5–5.1)
SODIUM: 140 meq/L (ref 135–145)
Total Bilirubin: 0.3 mg/dL (ref 0.2–1.2)
Total Protein: 6.8 g/dL (ref 6.0–8.3)

## 2017-03-07 LAB — TSH: TSH: 1.55 u[IU]/mL (ref 0.35–4.50)

## 2017-03-07 LAB — MAGNESIUM: MAGNESIUM: 1.9 mg/dL (ref 1.5–2.5)

## 2017-03-07 LAB — HEMOGLOBIN A1C: Hgb A1c MFr Bld: 5.7 % (ref 4.6–6.5)

## 2017-03-07 LAB — SEDIMENTATION RATE: Sed Rate: 7 mm/hr (ref 0–20)

## 2017-03-07 LAB — URINE CULTURE: Organism ID, Bacteria: NO GROWTH

## 2017-03-07 NOTE — Assessment & Plan Note (Signed)
Encouraged increased hydration, magnesium and Hyland's leg cramp med.

## 2017-03-07 NOTE — Assessment & Plan Note (Signed)
Encouraged moist heat and gentle stretching as tolerated. May try NSAIDs and prescription meds as directed and report if symptoms worsen or seek immediate care, restart Soma prn.

## 2017-03-07 NOTE — Assessment & Plan Note (Signed)
Urine culture negative but calcium oxalate seen suggestive of kidney stones. If pain persists will need to proceed with CT scan

## 2017-03-07 NOTE — Assessment & Plan Note (Signed)
Encouraged increased hydration and fiber in diet. Daily probiotics. If bowels not moving can use MOM 2 tbls po in 4 oz of warm prune juice by mouth every 2-3 days. If no results then repeat in 4 hours with  Dulcolax suppository pr, may repeat again in 4 more hours as needed. Seek care if symptoms worsen. Consider daily Miralax and/or Dulcolax if symptoms persist.  

## 2017-03-13 ENCOUNTER — Ambulatory Visit (HOSPITAL_BASED_OUTPATIENT_CLINIC_OR_DEPARTMENT_OTHER)
Admission: RE | Admit: 2017-03-13 | Discharge: 2017-03-13 | Disposition: A | Discharge status: Home / Self Care | Payer: Medicare Other | Source: Ambulatory Visit | Attending: Family Medicine | Admitting: Family Medicine

## 2017-03-13 DIAGNOSIS — R1013 Epigastric pain: Secondary | ICD-10-CM | POA: Diagnosis present

## 2017-03-13 DIAGNOSIS — K760 Fatty (change of) liver, not elsewhere classified: Secondary | ICD-10-CM | POA: Insufficient documentation

## 2017-03-14 ENCOUNTER — Ambulatory Visit (HOSPITAL_BASED_OUTPATIENT_CLINIC_OR_DEPARTMENT_OTHER): Payer: Medicare Other

## 2017-03-19 ENCOUNTER — Telehealth: Payer: Self-pay | Admitting: Family Medicine

## 2017-03-19 NOTE — Telephone Encounter (Signed)
Relation to HY:IFOY Call back number:[phone]   Reason for call:  Patient inquiring about imaging results, please advise

## 2017-03-19 NOTE — Telephone Encounter (Signed)
Patient informed of U/S results (see result notes).  She will wait on a referral to GI at this time as no symptoms at this time

## 2017-05-24 NOTE — Progress Notes (Deleted)
Subjective:   Beverly Erickson is a 46 y.o. female who presents for an Initial Medicare Annual Wellness Visit.  Review of Systems    No ROS.  Medicare Wellness Visit. Additional risk factors are reflected in the social history.    Sleep patterns:  Home Safety/Smoke Alarms: Feels safe in home. Smoke alarms in place.  Living environment; residence and Firearm Safety:  [place] Safety/Bike Helmet: Wears seat belt.   Counseling:   Eye Exam-  Dental-  Female:   Pap- Pt reports last done 01/17/14 with Dr.Wright. No result reported.      Mammo- last 02/26/14: BI-RADS CATEGORY  1: Negative.       Dexa scan-        CCS-n/a    Objective:    There were no vitals filed for this visit. There is no height or weight on file to calculate BMI.   Current Medications (verified) Outpatient Encounter Prescriptions as of 06/07/2017  Medication Sig  . albuterol (PROVENTIL HFA;VENTOLIN HFA) 108 (90 Base) MCG/ACT inhaler Inhale 2 puffs into the lungs every 6 (six) hours as needed for wheezing or shortness of breath.  . ALPRAZolam (XANAX) 0.25 MG tablet Take 1 tablet (0.25 mg total) by mouth 2 (two) times daily as needed for anxiety.  Marland Kitchen aspirin 81 MG tablet Take 81 mg by mouth daily.  Marland Kitchen buPROPion (WELLBUTRIN SR) 150 MG 12 hr tablet one tablet by mouth twice daily  . carisoprodol (SOMA) 350 MG tablet Take 1 tablet (350 mg total) by mouth 2 (two) times daily as needed for muscle spasms.  Marland Kitchen HYDROcodone-acetaminophen (NORCO) 7.5-325 MG tablet Take 2 tablets by mouth 2 (two) times daily as needed.  Marland Kitchen KRILL OIL PO Take 1 capsule by mouth daily.  . Misc. Devices (ROLLATOR) MISC Patient with severe pain and weakness in left leg Reflex Sympathetic Dystrophy  . nitroGLYCERIN (NITROSTAT) 0.4 MG SL tablet Place 1 tablet (0.4 mg total) under the tongue every 5 (five) minutes as needed for chest pain.  Marland Kitchen omeprazole (PRILOSEC) 40 MG capsule Take 1 capsule (40 mg total) by mouth 2 (two) times daily.  . Potassium 75 MG  TABS Take 1 each by mouth at bedtime.  . pramipexole (MIRAPEX) 1 MG tablet Take 1 tablet (1 mg total) by mouth at bedtime.  . ranitidine (ZANTAC) 300 MG tablet Take 1 tablet (300 mg total) by mouth daily as needed for heartburn.   No facility-administered encounter medications on file as of 06/07/2017.     Allergies (verified) Codeine   History: Past Medical History:  Diagnosis Date  . Anemia   . Chicken pox   . Depression   . Depression with anxiety 08/30/2015  . GERD (gastroesophageal reflux disease)   . Kidney stone 02/13/2014   Right, small  . Neck pain, musculoskeletal 11/17/2016  . Obesity   . Other and unspecified hyperlipidemia 01/14/2014  . Otitis media 11/17/2016  . Preventative health care 07/05/2014  . RSD (reflex sympathetic dystrophy)   . Tobacco use disorder 01/14/2014   1ppd  . Unspecified constipation 05/25/2014  . Urinary frequency 03/06/2017   Past Surgical History:  Procedure Laterality Date  . CESAREAN SECTION  2000  . plate and screws in left arm  2005   humerus  . WISDOM TOOTH EXTRACTION  46 yrs old   Family History  Problem Relation Age of Onset  . Arthritis Mother        Living  . Hypertension Mother   . Thrombocytopenia Mother   .  Hyperlipidemia Father   . Hypertension Father   . Thrombocytopenia Maternal Grandmother   . Stroke Maternal Grandmother   . Hypertension Maternal Grandmother   . Alcohol abuse Maternal Grandfather   . Heart disease Maternal Grandfather   . Parkinson's disease Maternal Grandfather   . Alcohol abuse Paternal Grandfather   . Cancer Paternal Grandfather   . Hypothyroidism Daughter   . Diabetes Daughter        type 1  . Dementia Paternal Grandmother    Social History   Occupational History  . Not on file.   Social History Main Topics  . Smoking status: Current Every Day Smoker    Types: Cigarettes  . Smokeless tobacco: Never Used  . Alcohol use Yes     Comment: once a year.  . Drug use: No  . Sexual activity: Not  on file     Comment: lives with son, 62 yo daughter, boyfriend, no dietary    Tobacco Counseling Ready to quit: Not Answered Counseling given: Not Answered   Activities of Daily Living In your present state of health, do you have any difficulty performing the following activities: 08/11/2016  Hearing? N  Vision? Y  Comment Seeing floaters  Difficulty concentrating or making decisions? Y  Comment Short term memory loss  Walking or climbing stairs? Y  Comment RSD  Dressing or bathing? N  Doing errands, shopping? N  Some recent data might be hidden    Immunizations and Health Maintenance Immunization History  Administered Date(s) Administered  . Td 10/17/2003  . Tdap 02/18/2016   Health Maintenance Due  Topic Date Due  . PAP SMEAR  01/17/2017    Patient Care Team: Mosie Lukes, MD as PCP - General (Family Medicine)  Indicate any recent Medical Services you may have received from other than [facility] providers in the past year (date may be approximate).     Assessment:   This is a routine wellness examination for Beverly Erickson. Physical assessment deferred to PCP.   Hearing/Vision screen No exam data present  Dietary issues and exercise activities discussed:   Diet (meal preparation, eat out, water intake, caffeinated beverages, dairy products, fruits and vegetables): {Desc; diets:16563} Breakfast: Lunch:  Dinner:      Goals    None     Depression Screen PHQ 2/9 Scores 08/11/2016  PHQ - 2 Score 0    Fall Risk No flowsheet data found.  Cognitive Function:        Screening Tests Health Maintenance  Topic Date Due  . PAP SMEAR  01/17/2017  . INFLUENZA VACCINE  08/11/2017 (Originally 05/16/2017)  . TETANUS/TDAP  02/17/2026  . HIV Screening  Addressed      Plan:   ***  I have personally reviewed and noted the following in the patient's chart:   . Medical and social history . Use of alcohol, tobacco or illicit drugs  . Current medications and  supplements . Functional ability and status . Nutritional status . Physical activity . Advanced directives . List of other physicians . Hospitalizations, surgeries, and ER visits in previous 12 months . Vitals . Screenings to include cognitive, depression, and falls . Referrals and appointments  In addition, I have reviewed and discussed with patient certain preventive protocols, quality metrics, and best practice recommendations. A written personalized care plan for preventive services as well as general preventive health recommendations were provided to patient.     Shela Nevin, [place]   05/24/2017

## 2017-06-07 ENCOUNTER — Ambulatory Visit (INDEPENDENT_AMBULATORY_CARE_PROVIDER_SITE_OTHER): Payer: Medicare Other | Admitting: Family Medicine

## 2017-06-07 ENCOUNTER — Telehealth: Payer: Self-pay | Admitting: Family Medicine

## 2017-06-07 VITALS — BP 109/67 | HR 102 | Temp 99.7°F | Resp 18 | Wt 300.0 lb

## 2017-06-07 DIAGNOSIS — G9050 Complex regional pain syndrome I, unspecified: Secondary | ICD-10-CM | POA: Diagnosis not present

## 2017-06-07 DIAGNOSIS — J019 Acute sinusitis, unspecified: Secondary | ICD-10-CM | POA: Diagnosis not present

## 2017-06-07 DIAGNOSIS — E6609 Other obesity due to excess calories: Secondary | ICD-10-CM | POA: Diagnosis not present

## 2017-06-07 DIAGNOSIS — B354 Tinea corporis: Secondary | ICD-10-CM

## 2017-06-07 DIAGNOSIS — K219 Gastro-esophageal reflux disease without esophagitis: Secondary | ICD-10-CM

## 2017-06-07 DIAGNOSIS — R0681 Apnea, not elsewhere classified: Secondary | ICD-10-CM

## 2017-06-07 DIAGNOSIS — M549 Dorsalgia, unspecified: Secondary | ICD-10-CM | POA: Diagnosis not present

## 2017-06-07 DIAGNOSIS — G90522 Complex regional pain syndrome I of left lower limb: Secondary | ICD-10-CM

## 2017-06-07 DIAGNOSIS — B9689 Other specified bacterial agents as the cause of diseases classified elsewhere: Secondary | ICD-10-CM

## 2017-06-07 DIAGNOSIS — R0789 Other chest pain: Secondary | ICD-10-CM

## 2017-06-07 MED ORDER — HYDROCODONE-ACETAMINOPHEN 7.5-325 MG PO TABS: 2.0000 | ORAL_TABLET | Freq: Two times a day (BID) | ORAL | 0 refills | Status: DC | PRN

## 2017-06-07 MED ORDER — NYSTATIN 100000 UNIT/GM EX CREA: 1.0000 "application " | TOPICAL_CREAM | Freq: Two times a day (BID) | CUTANEOUS | 1 refills | Status: DC

## 2017-06-07 MED ORDER — BENZONATATE 100 MG PO CAPS: 100.0000 mg | ORAL_CAPSULE | Freq: Three times a day (TID) | ORAL | 1 refills | Status: DC | PRN

## 2017-06-07 MED ORDER — CEFDINIR 300 MG PO CAPS: 300.0000 mg | ORAL_CAPSULE | Freq: Two times a day (BID) | ORAL | 0 refills | Status: AC

## 2017-06-07 MED ORDER — ALPRAZOLAM 0.25 MG PO TABS: 0.2500 mg | ORAL_TABLET | Freq: Two times a day (BID) | ORAL | 1 refills | Status: DC | PRN

## 2017-06-07 MED ORDER — NITROGLYCERIN 0.4 MG SL SUBL: 0.4000 mg | SUBLINGUAL_TABLET | SUBLINGUAL | 1 refills | Status: AC | PRN

## 2017-06-07 MED ORDER — OMEPRAZOLE 40 MG PO CPDR: 40.0000 mg | DELAYED_RELEASE_CAPSULE | Freq: Two times a day (BID) | ORAL | 1 refills | Status: DC

## 2017-06-07 MED ORDER — FLUCONAZOLE 150 MG PO TABS: 150.0000 mg | ORAL_TABLET | Freq: Once | ORAL | 0 refills | Status: AC

## 2017-06-07 MED FILL — NITROGLYCERIN 0.4 MG TAB SL: 0.4 | 30 days supply | Qty: 25 | Fill #0

## 2017-06-07 MED FILL — HYDROCODON-APAP 7.5-325: 7.5-325 | 15 days supply | Qty: 60 | Fill #0

## 2017-06-07 MED FILL — OMEPRAZOLE DR 40 MG CAPSULE: 40 | 90 days supply | Qty: 180 | Fill #0

## 2017-06-07 MED FILL — ALPRAZolam 0.25 MG TABS: 0.25 | 10 days supply | Qty: 20 | Fill #0

## 2017-06-07 MED FILL — CEFDINIR 300 MG CAPSULE: 300 | 10 days supply | Qty: 20 | Fill #0

## 2017-06-07 MED FILL — FLUCONAZOLE 150 MG TABLET: 150 | 1 days supply | Qty: 1 | Fill #0

## 2017-06-07 MED FILL — NYSTATIN 100,000 UNIT/GM CR: 100000 | 30 days supply | Qty: 30 | Fill #0

## 2017-06-07 MED FILL — BENZONATATE 100 MG CAP: 100 | 14 days supply | Qty: 40 | Fill #0

## 2017-06-07 NOTE — Telephone Encounter (Signed)
Fax medical records release form for pt.

## 2017-06-07 NOTE — Assessment & Plan Note (Signed)
Encouraged DASH diet, decrease po intake and increase exercise as tolerated. Needs 7-8 hours of sleep nightly. Avoid trans fats, eat small, frequent meals every 4-5 hours with lean proteins, complex carbs and healthy fats. Minimize simple carbs 

## 2017-06-07 NOTE — Patient Instructions (Addendum)
Plain Mucinex twice daily  Complex Regional Pain Syndrome Complex regional pain syndrome (CRPS) is a nerve disorder that causes long-lasting (chronic) pain, usually in a hand, arm, leg, or foot. CRPS usually follows an injury or trauma, such as a fracture or sprain. There are two types of CRPS:  Type 1. This type occurs after an injury or trauma with no known damage to a nerve.  Type 2. This type occurs after injury or trauma damages a nerve.  There are three stages of the condition:  Stage 1. This stage, called the acute stage, may last for three months.  Stage 2. This stage, called the dystrophic stage, may last for three to 12 months.  Stage 3. This stage, called the atrophic stage, may start after one year.  CRPS ranges from mild to severe. For most people CRPS is mild and recovery happens over time. For others, CRPS lasts a very long time and is debilitating. What are the causes? The exact cause of CRPS is not known. What increases the risk? You may be at increased risk if:  You are a woman.  You are approximately 45 years of age.  You have any of the following: ? A family history of CRPS. ? An injury or surgery. ? An infection. ? Cancer. ? Neck problems. ? A stroke. ? A heart attack. ? Asthma.  What are the signs or symptoms? Signs and symptoms in the affected limb are different for each stage. Signs and symptoms of stage 1 include:  Burning pain.  A pins and needles sensation.  Extremely sensitive skin.  Swelling.  Joint stiffness.  Warmth and redness.  Excessive sweating.  Hair and nail growth that is faster than normal.  Signs and symptoms of stage 2 include:  Spreading of pain to the whole limb.  Increased skin sensitivity.  Increased swelling and stiffness.  Coolness of the skin.  Blue discoloration of skin.  Loss of skin wrinkles.  Brittle fingernails.  Signs and symptoms of stage 3 include:  Pain that spreads to other areas of  the body but becomes less severe.  More stiffness, leading to loss of motion.  Skin that is pale, dry, shiny, and tightly stretched.  How is this diagnosed? There is no test to diagnose CRPS. Your health care provider will make a diagnosis based on your signs and symptoms and a physical exam. The exam may include tests to rule out other possible causes of your symptoms. Sometimes imaging tests are done, such as an MRI or bone scan. These tests check for bone changes that might indicate CRPS. How is this treated? Early treatment may prevent CRPS from advancing past stage 1. There is no one treatment that works for everyone. Treatment options may include:  Medicines, such as: ? Nonsteroidal-anti-inflammatory drugs (NSAIDS). ? Steroids. ? Blood pressure drugs. ? Antidepressants. ? Anti-seizure drugs. ? Pain relievers.  Exercise.  Occupational and physical therapy.  Biofeedback.  Mental health counseling.  Numbing injections.  Spinal surgery to implant a spinal cord stimulator or a pain pump.  Follow these instructions at home:  Take medicines only as directed by your health care provider.  Follow an exercise program as directed by your health care provider.  Maintain a healthy weight.  Keep all follow-up visits as directed by your health care provider. This is important. Contact a health care provider if:  Your symptoms change.  Your symptoms get worse.  You develop anxiety or depression. This information is not intended to replace advice  given to you by your health care provider. Make sure you discuss any questions you have with your health care provider. Document Released: 09/22/2002 Document Revised: 03/09/2016 Document Reviewed: 06/29/2014 Elsevier Interactive Patient Education  [employer].

## 2017-06-07 NOTE — Progress Notes (Signed)
Subjective:  I acted as a Education administrator for Dr. Charlett Blake. [name], [place]  Patient ID: Beverly Erickson, female    DOB: 1970/12/19, 46 y.o.   MRN: [id number]  No chief complaint on file.   HPI  Patient is in today for a follow up. Patient c/o sinus congestion., headache, rhinorrhea, malaise and myalgias.  Also left arm rash she states she normally has this rash that goes away in 2-3 days she states now it has not went away. She keeps it cleaned and dry but no relief. She states is it painful. No recent febrile illness or acute hospitalizations. Denies palp/SOB/HA/fevers/GI or GU c/o. Taking meds as prescribed. She does have intermittent sharp chest pain without associated symptoms. Her husband is with her and reports episodes of witnessed apnea and significant snoring each night.    Patient Care Team: Mosie Lukes, MD as PCP - General (Family Medicine)   Past Medical History:  Diagnosis Date  . Anemia   . Chicken pox   . Depression   . Depression with anxiety 08/30/2015  . GERD (gastroesophageal reflux disease)   . Kidney stone 02/13/2014   Right, small  . Neck pain, musculoskeletal 11/17/2016  . Obesity   . Other and unspecified hyperlipidemia 01/14/2014  . Otitis media 11/17/2016  . Preventative health care 07/05/2014  . RSD (reflex sympathetic dystrophy)   . Tinea corporis 06/10/2017  . Tobacco use disorder 01/14/2014   1ppd  . Unspecified constipation 05/25/2014  . Urinary frequency 03/06/2017    Past Surgical History:  Procedure Laterality Date  . CESAREAN SECTION  2000  . plate and screws in left arm  2005   humerus  . WISDOM TOOTH EXTRACTION  46 yrs old    Family History  Problem Relation Age of Onset  . Arthritis Mother        Living  . Hypertension Mother   . Thrombocytopenia Mother   . Hyperlipidemia Father   . Hypertension Father   . Thrombocytopenia Maternal Grandmother   . Stroke Maternal Grandmother   . Hypertension Maternal Grandmother   . Alcohol abuse Maternal  Grandfather   . Heart disease Maternal Grandfather   . Parkinson's disease Maternal Grandfather   . Alcohol abuse Paternal Grandfather   . Cancer Paternal Grandfather   . Hypothyroidism Daughter   . Diabetes Daughter        type 1  . Dementia Paternal Grandmother     Social History   Social History  . Marital status: Widowed    Spouse name: N/A  . Number of children: 3  . Years of education: N/A   Occupational History  . Not on file.   Social History Main Topics  . Smoking status: Current Every Day Smoker    Types: Cigarettes  . Smokeless tobacco: Never Used  . Alcohol use Yes     Comment: once a year.  . Drug use: No  . Sexual activity: Not on file     Comment: lives with son, 70 yo daughter, boyfriend, no dietary   Other Topics Concern  . Not on file   Social History Narrative  . No narrative on file    Outpatient Medications Prior to Visit  Medication Sig Dispense Refill  . albuterol (PROVENTIL HFA;VENTOLIN HFA) 108 (90 Base) MCG/ACT inhaler Inhale 2 puffs into the lungs every 6 (six) hours as needed for wheezing or shortness of breath. 1 Inhaler 2  . aspirin 81 MG tablet Take 81 mg by mouth daily.    Marland Kitchen  buPROPion (WELLBUTRIN SR) 150 MG 12 hr tablet one tablet by mouth twice daily 60 tablet 2  . carisoprodol (SOMA) 350 MG tablet Take 1 tablet (350 mg total) by mouth 2 (two) times daily as needed for muscle spasms. 60 tablet 01  . KRILL OIL PO Take 1 capsule by mouth daily.    . Misc. Devices (ROLLATOR) MISC Patient with severe pain and weakness in left leg Reflex Sympathetic Dystrophy 1 each 0  . Potassium 75 MG TABS Take 1 each by mouth at bedtime.    . pramipexole (MIRAPEX) 1 MG tablet Take 1 tablet (1 mg total) by mouth at bedtime. 30 tablet 2  . ranitidine (ZANTAC) 300 MG tablet Take 1 tablet (300 mg total) by mouth daily as needed for heartburn. 90 tablet 1  . ALPRAZolam (XANAX) 0.25 MG tablet Take 1 tablet (0.25 mg total) by mouth 2 (two) times daily as  needed for anxiety. 20 tablet 1  . HYDROcodone-acetaminophen (NORCO) 7.5-325 MG tablet Take 2 tablets by mouth 2 (two) times daily as needed. 60 tablet 0  . nitroGLYCERIN (NITROSTAT) 0.4 MG SL tablet Place 1 tablet (0.4 mg total) under the tongue every 5 (five) minutes as needed for chest pain. 25 tablet 1  . omeprazole (PRILOSEC) 40 MG capsule Take 1 capsule (40 mg total) by mouth 2 (two) times daily. 180 capsule 1   No facility-administered medications prior to visit.     Allergies  Allergen Reactions  . Codeine Nausea Only    Review of Systems  Constitutional: Negative for fever and malaise/fatigue.  HENT: Negative for congestion.   Eyes: Negative for blurred vision.  Respiratory: Negative for cough and shortness of breath.   Cardiovascular: Negative for chest pain, palpitations and leg swelling.  Gastrointestinal: Negative for vomiting.  Musculoskeletal: Negative for back pain.  Skin: Positive for rash.  Neurological: Negative for loss of consciousness and headaches.       Objective:    Physical Exam  Constitutional: She is oriented to person, place, and time. She appears well-developed and well-nourished. No distress.  HENT:  Head: Normocephalic and atraumatic.  Eyes: Conjunctivae are normal.  Neck: Normal range of motion. No thyromegaly present.  Cardiovascular: Normal rate and regular rhythm.   Pulmonary/Chest: Effort normal and breath sounds normal. She has no wheezes.  Abdominal: Soft. Bowel sounds are normal. There is no tenderness.  Musculoskeletal: Normal range of motion. She exhibits no edema or deformity.  Neurological: She is alert and oriented to person, place, and time.  Skin: Skin is warm and dry. She is not diaphoretic.  Psychiatric: She has a normal mood and affect.    BP 109/67 (BP Location: Right Arm, Patient Position: Sitting, Cuff Size: Large)   Pulse (!) 102   Temp 99.7 F (37.6 C) (Oral)   Resp 18   Wt 300 lb (136.1 kg)   SpO2 98%   BMI  46.99 kg/m  Wt Readings from Last 3 Encounters:  06/07/17 300 lb (136.1 kg)  03/06/17 292 lb 6.4 oz (132.6 kg)  11/17/16 289 lb (131.1 kg)   BP Readings from Last 3 Encounters:  06/07/17 109/67  03/06/17 104/66  11/17/16 125/62     Immunization History  Administered Date(s) Administered  . Td 10/17/2003  . Tdap 02/18/2016    Health Maintenance  Topic Date Due  . PAP SMEAR  01/17/2017  . INFLUENZA VACCINE  08/11/2017 (Originally 05/16/2017)  . TETANUS/TDAP  02/17/2026  . HIV Screening  Addressed    Lab  Results  Component Value Date   WBC 6.7 03/06/2017   HGB 14.2 03/06/2017   HCT 43.2 03/06/2017   PLT 271.0 03/06/2017   GLUCOSE 103 (H) 03/06/2017   CHOL 168 03/06/2017   TRIG 69.0 03/06/2017   HDL 41.50 03/06/2017   LDLCALC 113 (H) 03/06/2017   ALT 21 03/06/2017   AST 16 03/06/2017   NA 140 03/06/2017   K 3.9 03/06/2017   CL 110 03/06/2017   CREATININE 0.76 03/06/2017   BUN 16 03/06/2017   CO2 24 03/06/2017   TSH 1.55 03/06/2017   HGBA1C 5.7 03/06/2017    Lab Results  Component Value Date   TSH 1.55 03/06/2017   Lab Results  Component Value Date   WBC 6.7 03/06/2017   HGB 14.2 03/06/2017   HCT 43.2 03/06/2017   MCV 90.3 03/06/2017   PLT 271.0 03/06/2017   Lab Results  Component Value Date   NA 140 03/06/2017   K 3.9 03/06/2017   CO2 24 03/06/2017   GLUCOSE 103 (H) 03/06/2017   BUN 16 03/06/2017   CREATININE 0.76 03/06/2017   BILITOT 0.3 03/06/2017   ALKPHOS 58 03/06/2017   AST 16 03/06/2017   ALT 21 03/06/2017   PROT 6.8 03/06/2017   ALBUMIN 4.0 03/06/2017   CALCIUM 9.2 03/06/2017   GFR 87.47 03/06/2017   Lab Results  Component Value Date   CHOL 168 03/06/2017   Lab Results  Component Value Date   HDL 41.50 03/06/2017   Lab Results  Component Value Date   LDLCALC 113 (H) 03/06/2017   Lab Results  Component Value Date   TRIG 69.0 03/06/2017   Lab Results  Component Value Date   CHOLHDL 4 03/06/2017   Lab Results  Component  Value Date   HGBA1C 5.7 03/06/2017         Assessment & Plan:   Problem List Items Addressed This Visit    Complex regional pain syndrome I of lower limb   Relevant Medications   ALPRAZolam (XANAX) 0.25 MG tablet   GERD (gastroesophageal reflux disease)   Relevant Medications   omeprazole (PRILOSEC) 40 MG capsule   Obesity    Encouraged DASH diet, decrease po intake and increase exercise as tolerated. Needs 7-8 hours of sleep nightly. Avoid trans fats, eat small, frequent meals every 4-5 hours with lean proteins, complex carbs and healthy fats. Minimize simple carbs      RSD (reflex sympathetic dystrophy)   Relevant Medications   HYDROcodone-acetaminophen (NORCO) 7.5-325 MG tablet   ALPRAZolam (XANAX) 0.25 MG tablet   Mid back pain on right side   Relevant Medications   HYDROcodone-acetaminophen (NORCO) 7.5-325 MG tablet   ALPRAZolam (XANAX) 0.25 MG tablet   Atypical chest pain   Relevant Medications   nitroGLYCERIN (NITROSTAT) 0.4 MG SL tablet   Other Relevant Orders   Ambulatory referral to Cardiology   Acute bacterial sinusitis    Started on antibiotics and mucinex bid      Relevant Medications   benzonatate (TESSALON) 100 MG capsule   cefdinir (OMNICEF) 300 MG capsule   nystatin cream (MYCOSTATIN)   Witnessed episode of apnea - Primary   Relevant Orders   Ambulatory referral to Pulmonology   Tinea corporis    Nystatin cream bid      Relevant Medications   cefdinir (OMNICEF) 300 MG capsule   nystatin cream (MYCOSTATIN)      I have discontinued Ms. Main's nitroGLYCERIN. I am also having her start on benzonatate, nitroGLYCERIN, cefdinir, nystatin  cream, and fluconazole. Additionally, I am having her maintain her Potassium, aspirin, KRILL OIL PO, ROLLATOR, pramipexole, buPROPion, albuterol, ranitidine, carisoprodol, HYDROcodone-acetaminophen, ALPRAZolam, and omeprazole.  Meds ordered this encounter  Medications  . HYDROcodone-acetaminophen (NORCO) 7.5-325 MG  tablet    Sig: Take 2 tablets by mouth 2 (two) times daily as needed.    Dispense:  60 tablet    Refill:  0  . ALPRAZolam (XANAX) 0.25 MG tablet    Sig: Take 1 tablet (0.25 mg total) by mouth 2 (two) times daily as needed for anxiety.    Dispense:  20 tablet    Refill:  1  . omeprazole (PRILOSEC) 40 MG capsule    Sig: Take 1 capsule (40 mg total) by mouth 2 (two) times daily.    Dispense:  180 capsule    Refill:  1  . benzonatate (TESSALON) 100 MG capsule    Sig: Take 1 capsule (100 mg total) by mouth 3 (three) times daily as needed for cough.    Dispense:  40 capsule    Refill:  1  . nitroGLYCERIN (NITROSTAT) 0.4 MG SL tablet    Sig: Place 1 tablet (0.4 mg total) under the tongue every 5 (five) minutes as needed for chest pain.    Dispense:  25 tablet    Refill:  1  . cefdinir (OMNICEF) 300 MG capsule    Sig: Take 1 capsule (300 mg total) by mouth 2 (two) times daily.    Dispense:  20 capsule    Refill:  0  . nystatin cream (MYCOSTATIN)    Sig: Apply 1 application topically 2 (two) times daily.    Dispense:  30 g    Refill:  1  . fluconazole (DIFLUCAN) 150 MG tablet    Sig: Take 1 tablet (150 mg total) by mouth once.    Dispense:  1 tablet    Refill:  0     Penni Homans, MD

## 2017-06-10 ENCOUNTER — Encounter: Payer: Self-pay | Admitting: Family Medicine

## 2017-06-10 DIAGNOSIS — B354 Tinea corporis: Secondary | ICD-10-CM

## 2017-06-10 HISTORY — DX: Tinea corporis: B35.4

## 2017-06-10 NOTE — Assessment & Plan Note (Signed)
Started on antibiotics and mucinex bid

## 2017-06-10 NOTE — Assessment & Plan Note (Signed)
Nystatin cream bid 

## 2017-06-10 NOTE — Assessment & Plan Note (Signed)
Referred to cardiology for further work up. Seek care if pain recurs and does not remit

## 2017-06-10 NOTE — Assessment & Plan Note (Signed)
Referred to pulmonology for sleep study.

## 2017-06-14 ENCOUNTER — Telehealth: Payer: Self-pay | Admitting: Family Medicine

## 2017-06-14 NOTE — Telephone Encounter (Signed)
SB-Patient was seen on 8.23.18 for URI and was given Tessalon Perles 100mg  for her cough/she called stating that this is ineffective and has stuffy nose, cough, and now green sputum/plz advise/thx dmf

## 2017-06-14 NOTE — Telephone Encounter (Signed)
I also gave her Cefdinir an antibiotic did she take that yet? She can look for a cough syrup over the counter called Cheritussin and use that for a cough also

## 2017-06-14 NOTE — Telephone Encounter (Signed)
°  Relation to MC:NOBS Call back number: [phone] Pharmacy: [place], [address] [phone] (Phone) [phone] (Fax)     Reason for call:  Patient states benzonatate (TESSALON) 100 MG capsule is not working, stuffy nose, coughing and green phlegm

## 2017-06-15 MED ORDER — DOXYCYCLINE HYCLATE 100 MG PO TABS: 100.0000 mg | ORAL_TABLET | Freq: Two times a day (BID) | ORAL | 0 refills | Status: AC

## 2017-06-15 NOTE — Telephone Encounter (Signed)
She can try taking one of her Hydrocodone pain tabs for the cough that can help. Her symptoms can be viral which means rest, increased hydration will help but antibiotics but if it is bacterial she may need a different antibiotic so I am fine to have her take a week of doxycycline. Please send in Doxycycline 100 mg po bid x 7 days

## 2017-06-15 NOTE — Telephone Encounter (Signed)
SB-I spoke with the pt and advised Cheritussin OTC for cough/She has been taking Abx as directed and has 2d left/she is no better than she was and now at night cough is much worse/Plz advise/thx dmf

## 2017-06-15 NOTE — Telephone Encounter (Signed)
Tried to call and LMOVM of SB's suggestion for OTC Cheritussin and to confirm if taking Abx Cefdinir but VM is full/thx dmf

## 2017-06-15 NOTE — Telephone Encounter (Signed)
I tried to call pt but VM is full/I faxed in Doxy 100mg  bidx7d #[id number] with note to pharm stating unable to reach pt/if she calls will discuss SB message/thx dmf

## 2017-06-16 DIAGNOSIS — H6692 Otitis media, unspecified, left ear: Secondary | ICD-10-CM | POA: Diagnosis not present

## 2017-06-16 DIAGNOSIS — J3089 Other allergic rhinitis: Secondary | ICD-10-CM | POA: Diagnosis not present

## 2017-06-16 DIAGNOSIS — J209 Acute bronchitis, unspecified: Secondary | ICD-10-CM | POA: Diagnosis not present

## 2017-06-16 DIAGNOSIS — R05 Cough: Secondary | ICD-10-CM | POA: Diagnosis not present

## 2017-06-16 DIAGNOSIS — R0602 Shortness of breath: Secondary | ICD-10-CM | POA: Diagnosis not present

## 2017-06-18 DIAGNOSIS — J209 Acute bronchitis, unspecified: Secondary | ICD-10-CM | POA: Diagnosis not present

## 2017-06-18 DIAGNOSIS — R0602 Shortness of breath: Secondary | ICD-10-CM | POA: Diagnosis not present

## 2017-06-18 DIAGNOSIS — Z885 Allergy status to narcotic agent status: Secondary | ICD-10-CM | POA: Diagnosis not present

## 2017-06-18 DIAGNOSIS — R05 Cough: Secondary | ICD-10-CM | POA: Diagnosis not present

## 2017-06-18 DIAGNOSIS — F17210 Nicotine dependence, cigarettes, uncomplicated: Secondary | ICD-10-CM | POA: Diagnosis not present

## 2017-09-13 ENCOUNTER — Ambulatory Visit: Payer: Self-pay | Admitting: Family Medicine

## 2017-10-15 ENCOUNTER — Ambulatory Visit: Payer: Self-pay | Admitting: Family Medicine

## 2017-11-20 ENCOUNTER — Ambulatory Visit: Payer: Self-pay | Admitting: Family Medicine

## 2017-12-14 ENCOUNTER — Ambulatory Visit: Payer: Medicare Other | Admitting: Family Medicine

## 2018-01-08 ENCOUNTER — Ambulatory Visit: Payer: Medicare Other | Admitting: Family Medicine

## 2018-01-22 ENCOUNTER — Encounter: Payer: Self-pay | Admitting: Family Medicine

## 2018-01-22 ENCOUNTER — Ambulatory Visit (INDEPENDENT_AMBULATORY_CARE_PROVIDER_SITE_OTHER): Payer: Medicare Other | Admitting: Family Medicine

## 2018-01-22 VITALS — BP 128/78 | HR 84 | Temp 97.5°F | Resp 18 | Ht 67.0 in | Wt 290.6 lb

## 2018-01-22 DIAGNOSIS — R5383 Other fatigue: Secondary | ICD-10-CM | POA: Insufficient documentation

## 2018-01-22 DIAGNOSIS — J329 Chronic sinusitis, unspecified: Secondary | ICD-10-CM

## 2018-01-22 DIAGNOSIS — F418 Other specified anxiety disorders: Secondary | ICD-10-CM | POA: Diagnosis not present

## 2018-01-22 DIAGNOSIS — B9689 Other specified bacterial agents as the cause of diseases classified elsewhere: Secondary | ICD-10-CM | POA: Diagnosis not present

## 2018-01-22 DIAGNOSIS — E6609 Other obesity due to excess calories: Secondary | ICD-10-CM | POA: Diagnosis not present

## 2018-01-22 DIAGNOSIS — R739 Hyperglycemia, unspecified: Secondary | ICD-10-CM

## 2018-01-22 DIAGNOSIS — J019 Acute sinusitis, unspecified: Secondary | ICD-10-CM

## 2018-01-22 DIAGNOSIS — Z720 Tobacco use: Secondary | ICD-10-CM

## 2018-01-22 DIAGNOSIS — G9050 Complex regional pain syndrome I, unspecified: Secondary | ICD-10-CM

## 2018-01-22 DIAGNOSIS — R0681 Apnea, not elsewhere classified: Secondary | ICD-10-CM | POA: Diagnosis not present

## 2018-01-22 DIAGNOSIS — E782 Mixed hyperlipidemia: Secondary | ICD-10-CM

## 2018-01-22 DIAGNOSIS — M549 Dorsalgia, unspecified: Secondary | ICD-10-CM | POA: Diagnosis not present

## 2018-01-22 MED ORDER — BUPROPION HCL ER (SR) 150 MG PO TB12: ORAL_TABLET | ORAL | 2 refills | Status: DC

## 2018-01-22 MED ORDER — HYDROCODONE-ACETAMINOPHEN 7.5-325 MG PO TABS
2.0000 | ORAL_TABLET | Freq: Two times a day (BID) | ORAL | 0 refills | Status: DC | PRN
Start: 2018-01-22 — End: 2018-01-22

## 2018-01-22 MED ORDER — MUPIROCIN 2 % EX OINT: 1.0000 "application " | TOPICAL_OINTMENT | Freq: Every day | CUTANEOUS | 0 refills | Status: DC

## 2018-01-22 MED ORDER — AMOXICILLIN-POT CLAVULANATE 875-125 MG PO TABS: 1.0000 | ORAL_TABLET | Freq: Two times a day (BID) | ORAL | 0 refills | Status: DC

## 2018-01-22 MED ORDER — HYDROCODONE-ACETAMINOPHEN 7.5-325 MG PO TABS
2.0000 | ORAL_TABLET | Freq: Two times a day (BID) | ORAL | 0 refills | Status: DC | PRN
Start: 2018-01-22 — End: 2018-06-04

## 2018-01-22 MED ORDER — METHYLPREDNISOLONE 4 MG PO TABS: ORAL_TABLET | ORAL | 0 refills | Status: DC

## 2018-01-22 NOTE — Assessment & Plan Note (Signed)
Encouraged heart healthy diet, increase exercise, avoid trans fats, consider a krill oil cap daily 

## 2018-01-22 NOTE — Assessment & Plan Note (Signed)
hgba1c acceptable, minimize simple carbs. Increase exercise as tolerated. Continue current meds 

## 2018-01-22 NOTE — Assessment & Plan Note (Signed)
Struggling with worsening depression with her daughter having drug issues `and having her grandchildren in the house to be raised.

## 2018-01-22 NOTE — Patient Instructions (Signed)

## 2018-01-23 LAB — LIPID PANEL
Cholesterol: 141 mg/dL (ref 0–200)
HDL: 41.4 mg/dL (ref >39.00)
LDL Cholesterol: 84 mg/dL (ref 0–99)
NonHDL: 99.86
Total CHOL/HDL Ratio: 3
Triglycerides: 78 mg/dL (ref 0.0–149.0)
VLDL: 15.6 mg/dL (ref 0.0–40.0)

## 2018-01-23 LAB — COMPREHENSIVE METABOLIC PANEL
ALBUMIN: 3.7 g/dL (ref 3.5–5.2)
ALK PHOS: 59 U/L (ref 39–117)
ALT: 19 U/L (ref 0–35)
AST: 14 U/L (ref 0–37)
BILIRUBIN TOTAL: 0.3 mg/dL (ref 0.2–1.2)
BUN: 14 mg/dL (ref 6–23)
CALCIUM: 8.7 mg/dL (ref 8.4–10.5)
CO2: 26 mEq/L (ref 19–32)
CREATININE: 0.74 mg/dL (ref 0.40–1.20)
Chloride: 107 mEq/L (ref 96–112)
GFR: 89.85 mL/min (ref >60.00)
Glucose, Bld: 106 mg/dL — ABNORMAL HIGH (ref 70–99)
Potassium: 3.6 mEq/L (ref 3.5–5.1)
Sodium: 139 mEq/L (ref 135–145)
TOTAL PROTEIN: 6.7 g/dL (ref 6.0–8.3)

## 2018-01-23 LAB — CBC
HCT: 41.6 % (ref 36.0–46.0)
HEMOGLOBIN: 14.1 g/dL (ref 12.0–15.0)
MCHC: 34 g/dL (ref 30.0–36.0)
MCV: 88.7 fl (ref 78.0–100.0)
PLATELETS: 249 10*3/uL (ref 150.0–400.0)
RBC: 4.68 Mil/uL (ref 3.87–5.11)
RDW: 13.2 % (ref 11.5–15.5)
WBC: 6.7 10*3/uL (ref 4.0–10.5)

## 2018-01-23 LAB — TSH: TSH: 1.24 u[IU]/mL (ref 0.35–4.50)

## 2018-01-23 LAB — HEMOGLOBIN A1C: HEMOGLOBIN A1C: 5.7 % (ref 4.6–6.5)

## 2018-01-27 NOTE — Assessment & Plan Note (Signed)
Continues to struggle with daily pain and debility. Is allowed a refill of her Hydrocodone to use prn.UDS and contract UTD.

## 2018-01-27 NOTE — Assessment & Plan Note (Signed)
Is struggling with increasing symptoms over the past week. Started on Augmentin and Medrol, Mucinex and Encouraged increased rest and hydration, add probiotics, zinc such as Coldeze or Xicam. Treat fevers as needed

## 2018-01-27 NOTE — Assessment & Plan Note (Signed)
Encouraged DASH diet, decrease po intake and increase exercise as tolerated. Needs 7-8 hours of sleep nightly. Avoid trans fats, eat small, frequent meals every 4-5 hours with lean proteins, complex carbs and healthy fats. Minimize simple carbs. Discussed bariatric referral

## 2018-01-27 NOTE — Progress Notes (Signed)
Patient ID: Beverly Erickson, female   DOB: April 02, 1971, 47 y.o.   MRN: [id number]    Subjective:    Patient ID: Beverly Erickson, female    DOB: 1971-01-19, 47 y.o.   MRN: [id number]  Chief Complaint  Patient presents with  . Complex Regional Pain Syndrome    follow up  . Sinusitis    sneezing, cough, trouble breathing, congestion, 1 week, tylenol sinus-no improvment    HPI Patient is in today for follow up accompanied by her daughter. She is struggling with increasing sinus pressure, congestion, epistaxis, rhinorrhea and malaise. She denies headache, fevers, chills, nausea or vomting but notes some mild loose stool. She does anso endorse a sore throat, cough, wheezing with exertion and lying down and SOB She has not tried any OTC meds has just increased rest and fluids. She continues to struggle with the daily pain and debility of her RDS and left leg pain Past Medical History:  Diagnosis Date  . Anemia   . Chicken pox   . Depression   . Depression with anxiety 08/30/2015  . GERD (gastroesophageal reflux disease)   . Kidney stone 02/13/2014   Right, small  . Neck pain, musculoskeletal 11/17/2016  . Obesity   . Other and unspecified hyperlipidemia 01/14/2014  . Otitis media 11/17/2016  . Preventative health care 07/05/2014  . RSD (reflex sympathetic dystrophy)   . Tinea corporis 06/10/2017  . Tobacco use disorder 01/14/2014   1ppd  . Unspecified constipation 05/25/2014  . Urinary frequency 03/06/2017    Past Surgical History:  Procedure Laterality Date  . CESAREAN SECTION  2000  . plate and screws in left arm  2005   humerus  . WISDOM TOOTH EXTRACTION  47 yrs old    Family History  Problem Relation Age of Onset  . Arthritis Mother        Living  . Hypertension Mother   . Thrombocytopenia Mother   . Hyperlipidemia Father   . Hypertension Father   . Thrombocytopenia Maternal Grandmother   . Stroke Maternal Grandmother   . Hypertension Maternal Grandmother   . Alcohol abuse Maternal  Grandfather   . Heart disease Maternal Grandfather   . Parkinson's disease Maternal Grandfather   . Alcohol abuse Paternal Grandfather   . Cancer Paternal Grandfather   . Hypothyroidism Daughter   . Diabetes Daughter        type 1  . Dementia Paternal Grandmother     Social History   Socioeconomic History  . Marital status: Widowed    Spouse name: Not on file  . Number of children: 3  . Years of education: Not on file  . Highest education level: Not on file  Occupational History  . Not on file  Social Needs  . Financial resource strain: Not on file  . Food insecurity:    Worry: Not on file    Inability: Not on file  . Transportation needs:    Medical: Not on file    Non-medical: Not on file  Tobacco Use  . Smoking status: Current Every Day Smoker    Types: Cigarettes  . Smokeless tobacco: Never Used  Substance and Sexual Activity  . Alcohol use: Yes    Comment: once a year.  . Drug use: No  . Sexual activity: Not on file    Comment: lives with son, 18 yo daughter, boyfriend, no dietary  Lifestyle  . Physical activity:    Days per week: Not on file    Minutes per  session: Not on file  . Stress: Not on file  Relationships  . Social connections:    Talks on phone: Not on file    Gets together: Not on file    Attends religious service: Not on file    Active member of club or organization: Not on file    Attends meetings of clubs or organizations: Not on file    Relationship status: Not on file  . Intimate partner violence:    Fear of current or ex partner: Not on file    Emotionally abused: Not on file    Physically abused: Not on file    Forced sexual activity: Not on file  Other Topics Concern  . Not on file  Social History Narrative  . Not on file    Outpatient Medications Prior to Visit  Medication Sig Dispense Refill  . albuterol (PROVENTIL HFA;VENTOLIN HFA) 108 (90 Base) MCG/ACT inhaler Inhale 2 puffs into the lungs every 6 (six) hours as needed for  wheezing or shortness of breath. 1 Inhaler 2  . ALPRAZolam (XANAX) 0.25 MG tablet Take 1 tablet (0.25 mg total) by mouth 2 (two) times daily as needed for anxiety. 20 tablet 1  . aspirin 81 MG tablet Take 81 mg by mouth daily.    . carisoprodol (SOMA) 350 MG tablet Take 1 tablet (350 mg total) by mouth 2 (two) times daily as needed for muscle spasms. 60 tablet 01  . KRILL OIL PO Take 1 capsule by mouth daily.    . Misc. Devices (ROLLATOR) MISC Patient with severe pain and weakness in left leg Reflex Sympathetic Dystrophy 1 each 0  . nitroGLYCERIN (NITROSTAT) 0.4 MG SL tablet Place 1 tablet (0.4 mg total) under the tongue every 5 (five) minutes as needed for chest pain. 25 tablet 1  . nystatin cream (MYCOSTATIN) Apply 1 application topically 2 (two) times daily. 30 g 1  . omeprazole (PRILOSEC) 40 MG capsule Take 1 capsule (40 mg total) by mouth 2 (two) times daily. 180 capsule 1  . Potassium 75 MG TABS Take 1 each by mouth at bedtime.    . pramipexole (MIRAPEX) 1 MG tablet Take 1 tablet (1 mg total) by mouth at bedtime. 30 tablet 2  . ranitidine (ZANTAC) 300 MG tablet Take 1 tablet (300 mg total) by mouth daily as needed for heartburn. 90 tablet 1  . benzonatate (TESSALON) 100 MG capsule Take 1 capsule (100 mg total) by mouth 3 (three) times daily as needed for cough. 40 capsule 1  . buPROPion (WELLBUTRIN SR) 150 MG 12 hr tablet one tablet by mouth twice daily 60 tablet 2  . HYDROcodone-acetaminophen (NORCO) 7.5-325 MG tablet Take 2 tablets by mouth 2 (two) times daily as needed. 60 tablet 0   No facility-administered medications prior to visit.     Allergies  Allergen Reactions  . Codeine Nausea Only    Review of Systems  Constitutional: Positive for malaise/fatigue. Negative for chills and fever.  HENT: Positive for congestion, ear pain and sinus pain.   Eyes: Negative for blurred vision.  Respiratory: Positive for sputum production. Negative for shortness of breath.   Cardiovascular:  Negative for chest pain, palpitations and leg swelling.  Gastrointestinal: Negative for abdominal pain, blood in stool and nausea.  Genitourinary: Negative for dysuria and frequency.  Musculoskeletal: Positive for joint pain and myalgias. Negative for falls.  Skin: Negative for rash.  Neurological: Positive for sensory change. Negative for dizziness, loss of consciousness and headaches.  Endo/Heme/Allergies: Negative  for environmental allergies.  Psychiatric/Behavioral: Positive for depression. The patient is not nervous/anxious.        Objective:    Physical Exam  Constitutional: No distress.  HENT:  Head: Normocephalic and atraumatic.  Right Ear: External ear normal.  Left Ear: External ear normal.  Mouth/Throat: No oropharyngeal exudate.  TMs dull and retracted, nasal mucosa boggy and erythematous  Eyes: Left eye exhibits no discharge. No scleral icterus.  Neck: No JVD present. No tracheal deviation present.  Cardiovascular: Normal heart sounds and intact distal pulses.  Pulmonary/Chest: No respiratory distress. She has no rales.  Abdominal: She exhibits no distension and no mass. There is no tenderness. There is no guarding.  Musculoskeletal: She exhibits edema. She exhibits no tenderness.  Lymphadenopathy:    She has no cervical adenopathy.  Skin: No rash noted. No erythema.    BP 128/78 (BP Location: Right Arm, Patient Position: Sitting, Cuff Size: Large)   Pulse 84   Temp (!) 97.5 F (36.4 C) (Oral)   Resp 18   Ht 5\' 7"  (1.702 m)   Wt 290 lb 9.6 oz (131.8 kg)   SpO2 99%   BMI 45.51 kg/m  Wt Readings from Last 3 Encounters:  01/22/18 290 lb 9.6 oz (131.8 kg)  06/07/17 300 lb (136.1 kg)  03/06/17 292 lb 6.4 oz (132.6 kg)     Lab Results  Component Value Date   WBC 6.7 01/22/2018   HGB 14.1 01/22/2018   HCT 41.6 01/22/2018   PLT 249.0 01/22/2018   GLUCOSE 106 (H) 01/22/2018   CHOL 141 01/22/2018   TRIG 78.0 01/22/2018   HDL 41.40 01/22/2018   LDLCALC 84  01/22/2018   ALT 19 01/22/2018   AST 14 01/22/2018   NA 139 01/22/2018   K 3.6 01/22/2018   CL 107 01/22/2018   CREATININE 0.74 01/22/2018   BUN 14 01/22/2018   CO2 26 01/22/2018   TSH 1.24 01/22/2018   HGBA1C 5.7 01/22/2018    Lab Results  Component Value Date   TSH 1.24 01/22/2018   Lab Results  Component Value Date   WBC 6.7 01/22/2018   HGB 14.1 01/22/2018   HCT 41.6 01/22/2018   MCV 88.7 01/22/2018   PLT 249.0 01/22/2018   Lab Results  Component Value Date   NA 139 01/22/2018   K 3.6 01/22/2018   CO2 26 01/22/2018   GLUCOSE 106 (H) 01/22/2018   BUN 14 01/22/2018   CREATININE 0.74 01/22/2018   BILITOT 0.3 01/22/2018   ALKPHOS 59 01/22/2018   AST 14 01/22/2018   ALT 19 01/22/2018   PROT 6.7 01/22/2018   ALBUMIN 3.7 01/22/2018   CALCIUM 8.7 01/22/2018   GFR 89.85 01/22/2018   Lab Results  Component Value Date   CHOL 141 01/22/2018   Lab Results  Component Value Date   HDL 41.40 01/22/2018   Lab Results  Component Value Date   LDLCALC 84 01/22/2018   Lab Results  Component Value Date   TRIG 78.0 01/22/2018   Lab Results  Component Value Date   CHOLHDL 3 01/22/2018   Lab Results  Component Value Date   HGBA1C 5.7 01/22/2018       Assessment & Plan:   Problem List Items Addressed This Visit    Obesity    Encouraged DASH diet, decrease po intake and increase exercise as tolerated. Needs 7-8 hours of sleep nightly. Avoid trans fats, eat small, frequent meals every 4-5 hours with lean proteins, complex carbs and healthy fats.  Minimize simple carbs. Discussed bariatric referral      RSD (reflex sympathetic dystrophy)    Continues to struggle with daily pain and debility. Is allowed a refill of her Hydrocodone to use prn.UDS and contract UTD.       Relevant Medications   buPROPion (WELLBUTRIN SR) 150 MG 12 hr tablet   HYDROcodone-acetaminophen (NORCO) 7.5-325 MG tablet   Hyperglycemia    hgba1c acceptable, minimize simple carbs. Increase  exercise as tolerated. Continue current meds      Relevant Orders   Hemoglobin A1c (Completed)   Comprehensive metabolic panel (Completed)   TSH (Completed)   Hyperlipidemia, mixed    Encouraged heart healthy diet, increase exercise, avoid trans fats, consider a krill oil cap daily      Relevant Orders   Lipid panel (Completed)   TSH (Completed)   Mid back pain on right side   Relevant Medications   methylPREDNISolone (MEDROL) 4 MG tablet   HYDROcodone-acetaminophen (NORCO) 7.5-325 MG tablet   Depression with anxiety    Struggling with worsening depression with her daughter having drug issues `and having her grandchildren in the house to be raised.       Relevant Medications   buPROPion (WELLBUTRIN SR) 150 MG 12 hr tablet   Acute bacterial sinusitis    Is struggling with increasing symptoms over the past week. Started on Augmentin and Medrol, Mucinex and Encouraged increased rest and hydration, add probiotics, zinc such as Coldeze or Xicam. Treat fevers as needed      Relevant Medications   amoxicillin-clavulanate (AUGMENTIN) 875-125 MG tablet   methylPREDNISolone (MEDROL) 4 MG tablet   mupirocin ointment (BACTROBAN) 2 %   Witnessed episode of apnea - Primary   Relevant Orders   Ambulatory referral to Pulmonology   Fatigue   Relevant Orders   CBC (Completed)   TSH (Completed)    Other Visit Diagnoses    Recurrent sinusitis       Relevant Medications   amoxicillin-clavulanate (AUGMENTIN) 875-125 MG tablet   methylPREDNISolone (MEDROL) 4 MG tablet   Other Relevant Orders   Ambulatory referral to ENT   Tobacco abuse disorder       Relevant Medications   buPROPion (WELLBUTRIN SR) 150 MG 12 hr tablet      I have discontinued Clela Mannes's benzonatate. I am also having her start on amoxicillin-clavulanate, methylPREDNISolone, and mupirocin ointment. Additionally, I am having her maintain her Potassium, aspirin, KRILL OIL PO, ROLLATOR, pramipexole, albuterol, ranitidine,  carisoprodol, ALPRAZolam, omeprazole, nitroGLYCERIN, nystatin cream, buPROPion, and HYDROcodone-acetaminophen.  Meds ordered this encounter  Medications  . amoxicillin-clavulanate (AUGMENTIN) 875-125 MG tablet    Sig: Take 1 tablet by mouth 2 (two) times daily.    Dispense:  20 tablet    Refill:  0  . methylPREDNISolone (MEDROL) 4 MG tablet    Sig: 5 tab po qd X 1d then 4 tab po qd X 1d then 3 tab po qd X 1d then 2 tab po qd then 1 tab po qd    Dispense:  15 tablet    Refill:  0  . mupirocin ointment (BACTROBAN) 2 %    Sig: Place 1 application into the nose daily.    Dispense:  22 g    Refill:  0  . buPROPion (WELLBUTRIN SR) 150 MG 12 hr tablet    Sig: one tablet by mouth twice daily    Dispense:  60 tablet    Refill:  2  . DISCONTD: HYDROcodone-acetaminophen (NORCO) 7.5-325 MG tablet  Sig: Take 2 tablets by mouth 2 (two) times daily as needed.    Dispense:  60 tablet    Refill:  0  . HYDROcodone-acetaminophen (NORCO) 7.5-325 MG tablet    Sig: Take 2 tablets by mouth 2 (two) times daily as needed.    Dispense:  60 tablet    Refill:  0     Penni Homans, MD

## 2018-03-01 ENCOUNTER — Telehealth: Payer: Self-pay | Admitting: Family Medicine

## 2018-03-01 NOTE — Telephone Encounter (Unsigned)
Copied from [place] [phone]. Topic: Quick Communication - See Telephone Encounter >> Mar 01, 2018  4:02 PM Percell Belt A wrote: CRM for notification. See Telephone encounter for: 03/01/18. Pt called in and stated that back in April, she was seen and was given amoxicillin-clavulanate (AUGMENTIN) 875-125 MG tablet [[id number]]  and predisone.  She was not able to get it filled because she did not have the money for it.   She would like to know if this can be reorder today beside she is said that she has not gotten over it..  Please advise   Best number [phone]

## 2018-03-04 NOTE — Telephone Encounter (Signed)
Her old prescriptions should not have expired she can still use those

## 2018-03-04 NOTE — Telephone Encounter (Signed)
Tried calling pt back one number is disconnected second couldn't leave a voicemail

## 2018-03-04 NOTE — Telephone Encounter (Signed)
Please advise 

## 2018-03-26 ENCOUNTER — Ambulatory Visit: Payer: Medicare Other | Admitting: Family Medicine

## 2018-03-27 ENCOUNTER — Encounter: Payer: Self-pay | Admitting: Family Medicine

## 2018-04-11 MED FILL — HYDROCODON-APAP 7.5-325: 7.5-325 | 15 days supply | Qty: 60 | Fill #0

## 2018-04-14 IMAGING — DX DG CERVICAL SPINE 2 OR 3 VIEWS
5 series · 5 of 5 positions shown · non-contrast
Comparison: None.

CLINICAL DATA: Right-sided neck pain radiating into the right
shoulder.

EXAM:
CERVICAL SPINE - 3 VIEW

[c-spine lat]
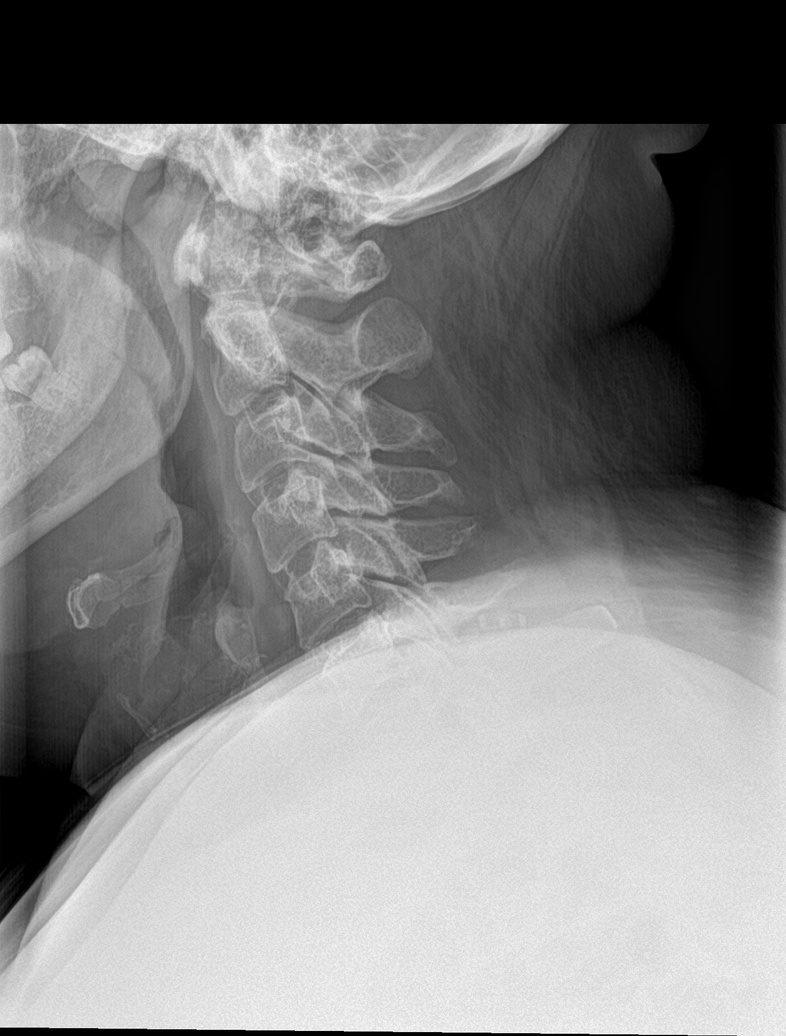

[c-spine ap]
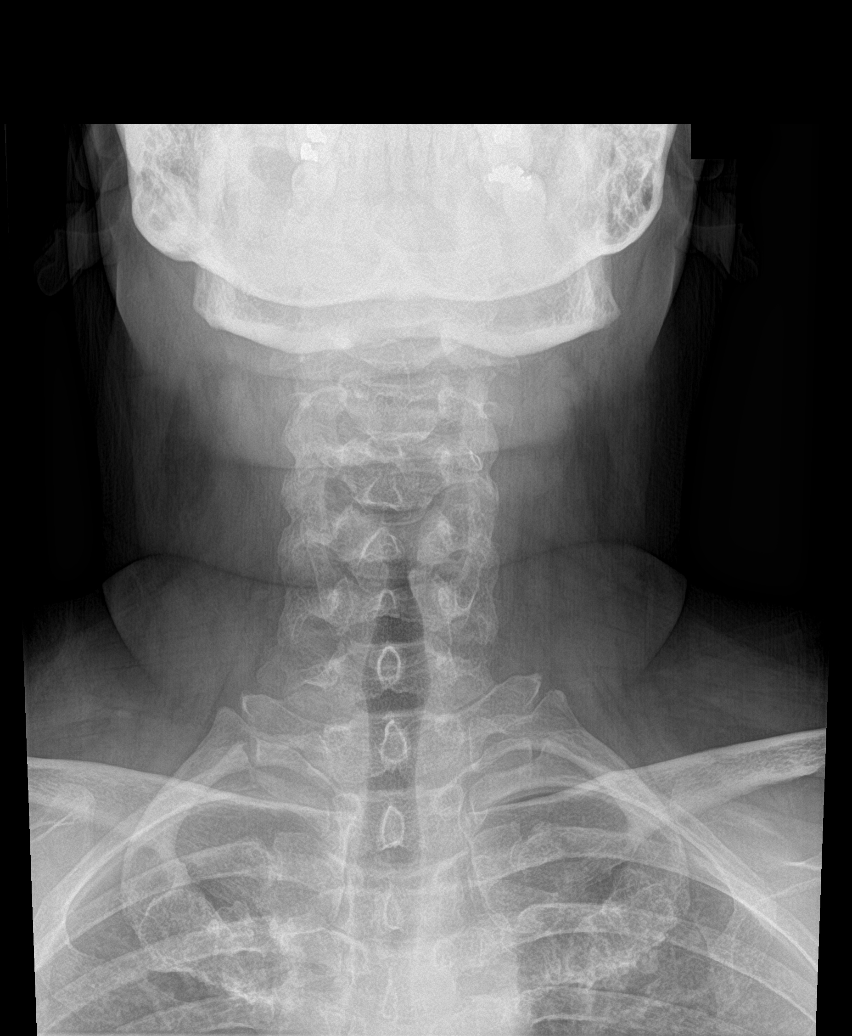

[c-spine open mouth (1 of 2)]
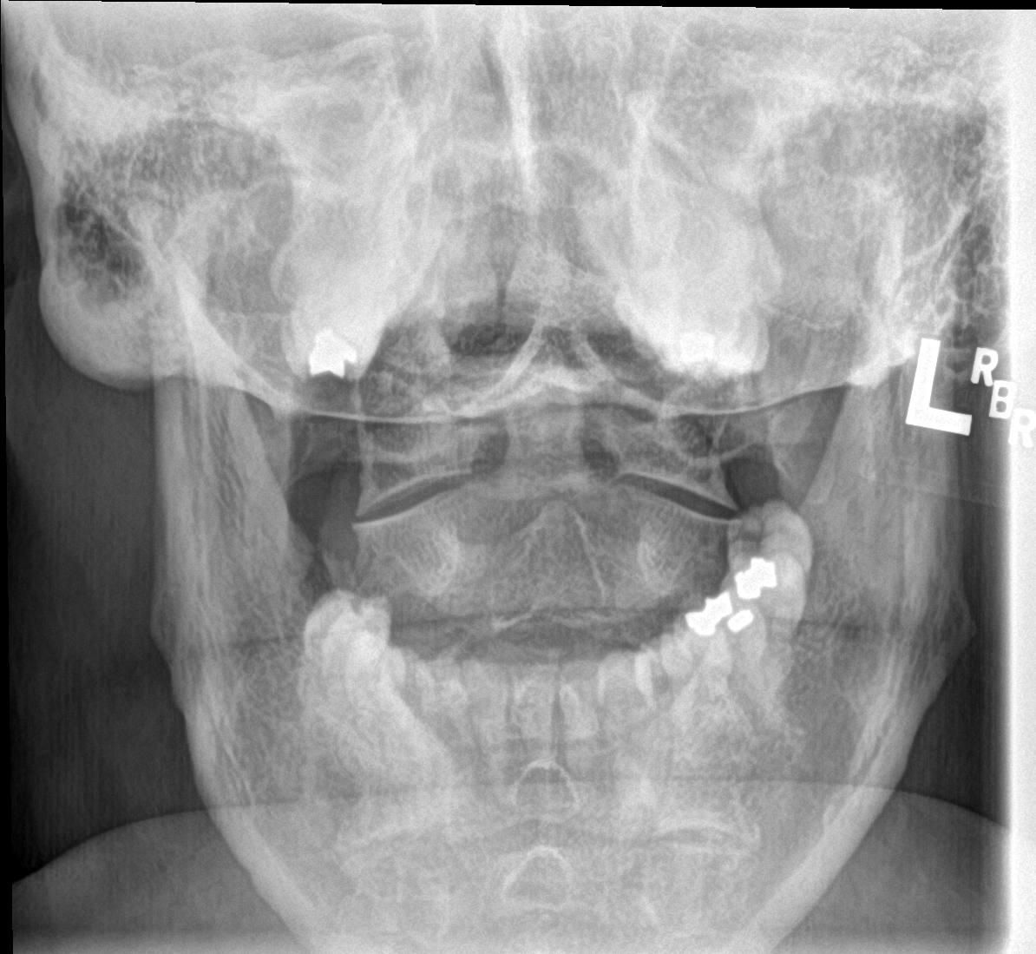

[c-spine swimmers]
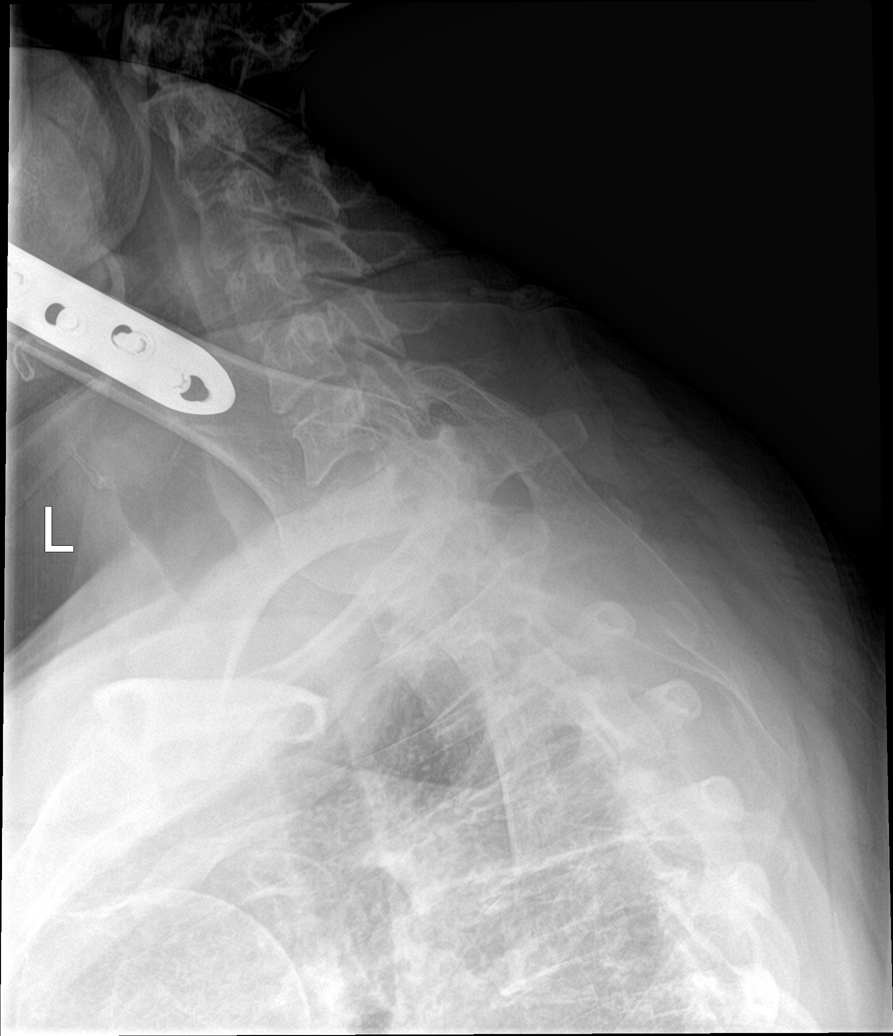

[c-spine open mouth (2 of 2)]
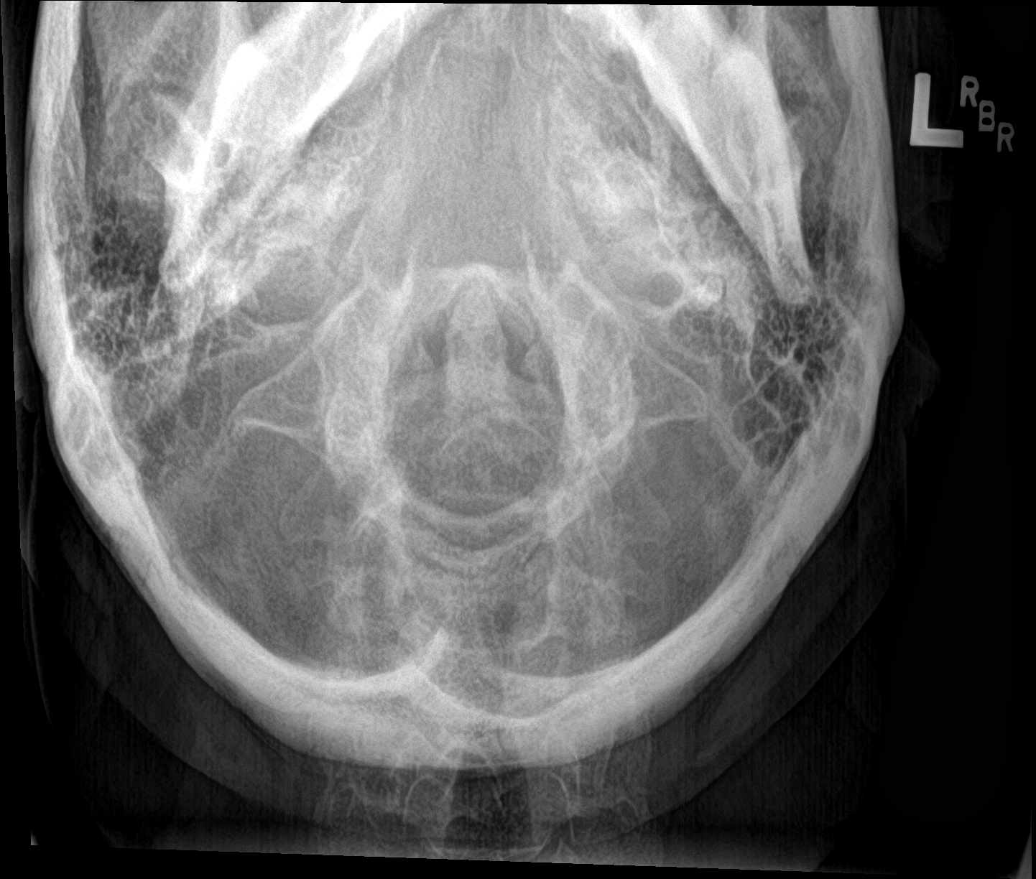

[5 of 5 positions shown; findings below may reference images not displayed]

FINDINGS: Seven cervical segments are visualized. Vertebral body height is
well maintained. No acute fracture or acute facet abnormality is
noted. The odontoid is within normal limits. The upper chest is
unremarkable. No soft tissue abnormality is seen.
IMPRESSION: No acute abnormality noted.

## 2018-06-04 ENCOUNTER — Other Ambulatory Visit: Payer: Self-pay | Admitting: Family Medicine

## 2018-06-04 DIAGNOSIS — G9050 Complex regional pain syndrome I, unspecified: Secondary | ICD-10-CM

## 2018-06-04 DIAGNOSIS — M549 Dorsalgia, unspecified: Secondary | ICD-10-CM

## 2018-06-04 NOTE — Telephone Encounter (Signed)
Hydro codon-APAP 7.5-325 mg refill Last Refill:04/11/18 # 60 Last OV: 01/22/18 PCP: [facility]  Xanax 0.25 mg Last refill: 06/07/17 # 20 1 RF Last OV: 01/22/18 PCP: Charlett Blake

## 2018-06-04 NOTE — Telephone Encounter (Signed)
Copied from [place] [id number]. Topic: Quick Communication - Rx Refill/Question >> Jun 04, 2018  1:27 PM Oliver Pila B wrote: Medication: HYDROcodone-acetaminophen ([place]) 7.5-325 MG tablet [[id number]]   Has the patient contacted their pharmacy? Yes.   (Agent: If no, request that the patient contact the pharmacy for the refill.) (Agent: If yes, when and what did the pharmacy advise?)  Preferred Pharmacy (with phone number or street name): [facility]  Agent: Please be advised that RX refills may take up to 3 business days. We ask that you follow-up with your pharmacy.

## 2018-06-05 MED FILL — ALPRAZolam 0.25 MG TABS: 0.25 | 10 days supply | Qty: 20 | Fill #0

## 2018-06-05 MED FILL — HYDROCODON-APAP 7.5-325: 7.5-325 | 15 days supply | Qty: 60 | Fill #0

## 2018-06-06 ENCOUNTER — Ambulatory Visit: Payer: Medicare Other | Admitting: Family Medicine

## 2018-06-18 ENCOUNTER — Encounter: Payer: Self-pay | Admitting: Family Medicine

## 2018-07-22 ENCOUNTER — Ambulatory Visit (INDEPENDENT_AMBULATORY_CARE_PROVIDER_SITE_OTHER): Payer: Medicare Other | Admitting: Family Medicine

## 2018-07-22 ENCOUNTER — Encounter: Payer: Self-pay | Admitting: Family Medicine

## 2018-07-22 VITALS — BP 110/60 | HR 70 | Temp 98.2°F | Resp 18 | Wt 251.0 lb

## 2018-07-22 DIAGNOSIS — K2100 Gastro-esophageal reflux disease with esophagitis, without bleeding: Secondary | ICD-10-CM

## 2018-07-22 DIAGNOSIS — R0602 Shortness of breath: Secondary | ICD-10-CM | POA: Diagnosis not present

## 2018-07-22 DIAGNOSIS — G90522 Complex regional pain syndrome I of left lower limb: Secondary | ICD-10-CM | POA: Diagnosis not present

## 2018-07-22 DIAGNOSIS — G9050 Complex regional pain syndrome I, unspecified: Secondary | ICD-10-CM

## 2018-07-22 DIAGNOSIS — R358 Other polyuria: Secondary | ICD-10-CM | POA: Diagnosis not present

## 2018-07-22 DIAGNOSIS — K219 Gastro-esophageal reflux disease without esophagitis: Secondary | ICD-10-CM

## 2018-07-22 DIAGNOSIS — E785 Hyperlipidemia, unspecified: Secondary | ICD-10-CM

## 2018-07-22 DIAGNOSIS — Z79899 Other long term (current) drug therapy: Secondary | ICD-10-CM | POA: Diagnosis not present

## 2018-07-22 DIAGNOSIS — R739 Hyperglycemia, unspecified: Secondary | ICD-10-CM

## 2018-07-22 DIAGNOSIS — R631 Polydipsia: Secondary | ICD-10-CM

## 2018-07-22 DIAGNOSIS — M549 Dorsalgia, unspecified: Secondary | ICD-10-CM | POA: Diagnosis not present

## 2018-07-22 DIAGNOSIS — R000 Tachycardia, unspecified: Secondary | ICD-10-CM | POA: Diagnosis not present

## 2018-07-22 DIAGNOSIS — F418 Other specified anxiety disorders: Secondary | ICD-10-CM

## 2018-07-22 DIAGNOSIS — R3589 Other polyuria: Secondary | ICD-10-CM

## 2018-07-22 MED ORDER — ALPRAZOLAM 0.25 MG PO TABS: 0.2500 mg | ORAL_TABLET | Freq: Two times a day (BID) | ORAL | 5 refills | Status: DC | PRN

## 2018-07-22 MED ORDER — ALBUTEROL SULFATE HFA 108 (90 BASE) MCG/ACT IN AERS: 2.0000 | INHALATION_SPRAY | Freq: Four times a day (QID) | RESPIRATORY_TRACT | 2 refills | Status: DC | PRN

## 2018-07-22 MED ORDER — HYDROCODONE-ACETAMINOPHEN 7.5-325 MG PO TABS: 2.0000 | ORAL_TABLET | Freq: Two times a day (BID) | ORAL | 0 refills | Status: DC | PRN

## 2018-07-22 MED FILL — ALPRAZolam 0.25 MG TABS: 0.25 | 15 days supply | Qty: 30 | Fill #0

## 2018-07-22 MED FILL — HYDROCODON-APAP 7.5-325: 7.5-325 | 15 days supply | Qty: 60 | Fill #0

## 2018-07-22 NOTE — Patient Instructions (Signed)

## 2018-07-23 LAB — PAIN MGMT, PROFILE 8 W/CONF, U
6 ACETYLMORPHINE: NEGATIVE ng/mL (ref <10)
ALCOHOL METABOLITES: NEGATIVE ng/mL (ref <500)
Amphetamines: NEGATIVE ng/mL (ref <500)
Benzodiazepines: NEGATIVE ng/mL (ref <100)
Buprenorphine, Urine: NEGATIVE ng/mL (ref <5)
COCAINE METABOLITE: NEGATIVE ng/mL (ref <150)
Creatinine: 204.3 mg/dL
MDMA: NEGATIVE ng/mL (ref <500)
Marijuana Metabolite: NEGATIVE ng/mL (ref <20)
OPIATES: NEGATIVE ng/mL (ref <100)
Oxidant: NEGATIVE ug/mL (ref <200)
Oxycodone: NEGATIVE ng/mL (ref <100)
PH: 6.2 (ref 4.5–9.0)

## 2018-07-28 DIAGNOSIS — R0602 Shortness of breath: Secondary | ICD-10-CM | POA: Insufficient documentation

## 2018-07-28 NOTE — Assessment & Plan Note (Signed)
Hydrocodone refilled. UDS and contract UTD. meds work adequately and no concerning side effects.

## 2018-07-28 NOTE — Progress Notes (Signed)
Subjective:    Patient ID: Beverly Erickson, female    DOB: Jul 24, 1971, 47 y.o.   MRN: [id number]  No chief complaint on file.   HPI Patient is in today for follow up. She continues to struggle with chronic pain in Her left leg mostly. She is managing with pain meds but has daily pain. She is under a great deal of stress at home. No flare in heartburn. Denies CP/palp/SOB/HA/congestion/fevers/GI or GU c/o. Taking meds as prescribed Past Medical History:  Diagnosis Date  . Anemia   . Chicken pox   . Depression   . Depression with anxiety 08/30/2015  . GERD (gastroesophageal reflux disease)   . Kidney stone 02/13/2014   Right, small  . Neck pain, musculoskeletal 11/17/2016  . Obesity   . Other and unspecified hyperlipidemia 01/14/2014  . Otitis media 11/17/2016  . Preventative health care 07/05/2014  . RSD (reflex sympathetic dystrophy)   . Tinea corporis 06/10/2017  . Tobacco use disorder 01/14/2014   1ppd  . Unspecified constipation 05/25/2014  . Urinary frequency 03/06/2017    Past Surgical History:  Procedure Laterality Date  . CESAREAN SECTION  2000  . plate and screws in left arm  2005   humerus  . WISDOM TOOTH EXTRACTION  47 yrs old    Family History  Problem Relation Age of Onset  . Arthritis Mother        Living  . Hypertension Mother   . Thrombocytopenia Mother   . Hyperlipidemia Father   . Hypertension Father   . Thrombocytopenia Maternal Grandmother   . Stroke Maternal Grandmother   . Hypertension Maternal Grandmother   . Alcohol abuse Maternal Grandfather   . Heart disease Maternal Grandfather   . Parkinson's disease Maternal Grandfather   . Alcohol abuse Paternal Grandfather   . Cancer Paternal Grandfather   . Hypothyroidism Daughter   . Diabetes Daughter        type 1  . Dementia Paternal Grandmother     Social History   Socioeconomic History  . Marital status: Widowed    Spouse name: Not on file  . Number of children: 3  . Years of education: Not on  file  . Highest education level: Not on file  Occupational History  . Not on file  Social Needs  . Financial resource strain: Not on file  . Food insecurity:    Worry: Not on file    Inability: Not on file  . Transportation needs:    Medical: Not on file    Non-medical: Not on file  Tobacco Use  . Smoking status: Current Every Day Smoker    Types: Cigarettes  . Smokeless tobacco: Never Used  Substance and Sexual Activity  . Alcohol use: Yes    Comment: once a year.  . Drug use: No  . Sexual activity: Not on file    Comment: lives with son, 18 yo daughter, boyfriend, no dietary  Lifestyle  . Physical activity:    Days per week: Not on file    Minutes per session: Not on file  . Stress: Not on file  Relationships  . Social connections:    Talks on phone: Not on file    Gets together: Not on file    Attends religious service: Not on file    Active member of club or organization: Not on file    Attends meetings of clubs or organizations: Not on file    Relationship status: Not on file  . Intimate  partner violence:    Fear of current or ex partner: Not on file    Emotionally abused: Not on file    Physically abused: Not on file    Forced sexual activity: Not on file  Other Topics Concern  . Not on file  Social History Narrative  . Not on file    Outpatient Medications Prior to Visit  Medication Sig Dispense Refill  . aspirin 81 MG tablet Take 81 mg by mouth daily.    Marland Kitchen buPROPion (WELLBUTRIN SR) 150 MG 12 hr tablet one tablet by mouth twice daily 60 tablet 2  . carisoprodol (SOMA) 350 MG tablet Take 1 tablet (350 mg total) by mouth 2 (two) times daily as needed for muscle spasms. 60 tablet 01  . KRILL OIL PO Take 1 capsule by mouth daily.    . methylPREDNISolone (MEDROL) 4 MG tablet 5 tab po qd X 1d then 4 tab po qd X 1d then 3 tab po qd X 1d then 2 tab po qd then 1 tab po qd 15 tablet 0  . Misc. Devices (ROLLATOR) MISC Patient with severe pain and weakness in left  leg Reflex Sympathetic Dystrophy 1 each 0  . nitroGLYCERIN (NITROSTAT) 0.4 MG SL tablet Place 1 tablet (0.4 mg total) under the tongue every 5 (five) minutes as needed for chest pain. 25 tablet 1  . omeprazole (PRILOSEC) 40 MG capsule Take 1 capsule (40 mg total) by mouth 2 (two) times daily. 180 capsule 1  . Potassium 75 MG TABS Take 1 each by mouth at bedtime.    . pramipexole (MIRAPEX) 1 MG tablet Take 1 tablet (1 mg total) by mouth at bedtime. 30 tablet 2  . ranitidine (ZANTAC) 300 MG tablet Take 1 tablet (300 mg total) by mouth daily as needed for heartburn. 90 tablet 1  . albuterol (PROVENTIL HFA;VENTOLIN HFA) 108 (90 Base) MCG/ACT inhaler Inhale 2 puffs into the lungs every 6 (six) hours as needed for wheezing or shortness of breath. 1 Inhaler 2  . ALPRAZolam (XANAX) 0.25 MG tablet TAKE 1 TABLET BY MOUTH TWICE DAILY AS NEEDED FOR ANXIETY 20 tablet 1  . amoxicillin-clavulanate (AUGMENTIN) 875-125 MG tablet Take 1 tablet by mouth 2 (two) times daily. 20 tablet 0  . HYDROcodone-acetaminophen (NORCO) 7.5-325 MG tablet TAKE 2 TABLETS BY MOUTH 2 (TWO) TIMES DAILY AS NEEDED. 60 tablet 0  . mupirocin ointment (BACTROBAN) 2 % Place 1 application into the nose daily. 22 g 0  . nystatin cream (MYCOSTATIN) Apply 1 application topically 2 (two) times daily. 30 g 1   No facility-administered medications prior to visit.     Allergies  Allergen Reactions  . Codeine Nausea Only    Review of Systems  Constitutional: Negative for fever and malaise/fatigue.  HENT: Negative for congestion.   Eyes: Negative for blurred vision and discharge.  Cardiovascular: Negative for chest pain, palpitations and leg swelling.  Gastrointestinal: Negative for abdominal pain, blood in stool and nausea.  Genitourinary: Negative for dysuria and frequency.  Musculoskeletal: Positive for myalgias. Negative for falls and joint pain.  Skin: Negative for rash.  Neurological: Positive for focal weakness. Negative for  dizziness, loss of consciousness and headaches.  Endo/Heme/Allergies: Negative for environmental allergies.  Psychiatric/Behavioral: Negative for depression. The patient is not nervous/anxious.        Objective:    Physical Exam  Constitutional: She is oriented to person, place, and time. She appears well-developed and well-nourished. No distress.  HENT:  Head: Normocephalic and atraumatic.  Nose: Nose normal.  Eyes: Right eye exhibits no discharge. Left eye exhibits no discharge.  Neck: Normal range of motion. Neck supple.  Cardiovascular: Normal rate, regular rhythm and normal heart sounds.  No murmur heard. Pulmonary/Chest: Effort normal and breath sounds normal.  Abdominal: Soft. Bowel sounds are normal. There is no tenderness.  Musculoskeletal: She exhibits no edema.  Neurological: She is alert and oriented to person, place, and time.  Skin: Skin is warm and dry.  Psychiatric: She has a normal mood and affect.  Nursing note and vitals reviewed.   BP 110/60 (BP Location: Left Arm, Patient Position: Sitting, Cuff Size: Large)   Pulse 70   Temp 98.2 F (36.8 C) (Oral)   Resp 18   Wt 251 lb (113.9 kg)   SpO2 98%   BMI 39.31 kg/m  Wt Readings from Last 3 Encounters:  07/22/18 251 lb (113.9 kg)  01/22/18 290 lb 9.6 oz (131.8 kg)  06/07/17 300 lb (136.1 kg)     Lab Results  Component Value Date   WBC 6.7 01/22/2018   HGB 14.1 01/22/2018   HCT 41.6 01/22/2018   PLT 249.0 01/22/2018   GLUCOSE 106 (H) 01/22/2018   CHOL 141 01/22/2018   TRIG 78.0 01/22/2018   HDL 41.40 01/22/2018   LDLCALC 84 01/22/2018   ALT 19 01/22/2018   AST 14 01/22/2018   NA 139 01/22/2018   K 3.6 01/22/2018   CL 107 01/22/2018   CREATININE 0.74 01/22/2018   BUN 14 01/22/2018   CO2 26 01/22/2018   TSH 1.24 01/22/2018   HGBA1C 5.7 01/22/2018    Lab Results  Component Value Date   TSH 1.24 01/22/2018   Lab Results  Component Value Date   WBC 6.7 01/22/2018   HGB 14.1 01/22/2018    HCT 41.6 01/22/2018   MCV 88.7 01/22/2018   PLT 249.0 01/22/2018   Lab Results  Component Value Date   NA 139 01/22/2018   K 3.6 01/22/2018   CO2 26 01/22/2018   GLUCOSE 106 (H) 01/22/2018   BUN 14 01/22/2018   CREATININE 0.74 01/22/2018   BILITOT 0.3 01/22/2018   ALKPHOS 59 01/22/2018   AST 14 01/22/2018   ALT 19 01/22/2018   PROT 6.7 01/22/2018   ALBUMIN 3.7 01/22/2018   CALCIUM 8.7 01/22/2018   GFR 89.85 01/22/2018   Lab Results  Component Value Date   CHOL 141 01/22/2018   Lab Results  Component Value Date   HDL 41.40 01/22/2018   Lab Results  Component Value Date   LDLCALC 84 01/22/2018   Lab Results  Component Value Date   TRIG 78.0 01/22/2018   Lab Results  Component Value Date   CHOLHDL 3 01/22/2018   Lab Results  Component Value Date   HGBA1C 5.7 01/22/2018       Assessment & Plan:   Problem List Items Addressed This Visit    Complex regional pain syndrome I of lower limb    Hydrocodone refilled. UDS and contract UTD. meds work adequately and no concerning side effects.      Relevant Medications   ALPRAZolam (XANAX) 0.25 MG tablet   GERD (gastroesophageal reflux disease)   Relevant Medications   albuterol (PROVENTIL HFA;VENTOLIN HFA) 108 (90 Base) MCG/ACT inhaler   RSD (reflex sympathetic dystrophy)   Relevant Medications   HYDROcodone-acetaminophen (NORCO) 7.5-325 MG tablet   ALPRAZolam (XANAX) 0.25 MG tablet   Hyperglycemia    hgba1c acceptable, minimize simple carbs. Increase exercise as tolerated.  Mid back pain on right side   Relevant Medications   HYDROcodone-acetaminophen (NORCO) 7.5-325 MG tablet   ALPRAZolam (XANAX) 0.25 MG tablet   Depression with anxiety    Stable on current meds no changes.       Relevant Medications   ALPRAZolam (XANAX) 0.25 MG tablet   SOB (shortness of breath)    Given refill on Albuterol to use prn      Relevant Medications   albuterol (PROVENTIL HFA;VENTOLIN HFA) 108 (90 Base) MCG/ACT  inhaler    Other Visit Diagnoses    High risk medication use    -  Primary   Relevant Orders   Pain Mgmt, Profile 8 w/Conf, U (Completed)   Polyuria       Relevant Medications   albuterol (PROVENTIL HFA;VENTOLIN HFA) 108 (90 Base) MCG/ACT inhaler   Polydipsia       Relevant Medications   albuterol (PROVENTIL HFA;VENTOLIN HFA) 108 (90 Base) MCG/ACT inhaler   Tachycardia       Relevant Medications   albuterol (PROVENTIL HFA;VENTOLIN HFA) 108 (90 Base) MCG/ACT inhaler   Hyperlipidemia, unspecified hyperlipidemia type       Relevant Medications   albuterol (PROVENTIL HFA;VENTOLIN HFA) 108 (90 Base) MCG/ACT inhaler      I have discontinued Ivin Booty Abbasi's nystatin cream, amoxicillin-clavulanate, and mupirocin ointment. I have also changed her ALPRAZolam. Additionally, I am having her maintain her Potassium, aspirin, KRILL OIL PO, ROLLATOR, pramipexole, ranitidine, carisoprodol, omeprazole, nitroGLYCERIN, methylPREDNISolone, buPROPion, albuterol, and HYDROcodone-acetaminophen.  Meds ordered this encounter  Medications  . albuterol (PROVENTIL HFA;VENTOLIN HFA) 108 (90 Base) MCG/ACT inhaler    Sig: Inhale 2 puffs into the lungs every 6 (six) hours as needed for wheezing or shortness of breath.    Dispense:  2 Inhaler    Refill:  2  . HYDROcodone-acetaminophen (NORCO) 7.5-325 MG tablet    Sig: Take 2 tablets by mouth 2 (two) times daily as needed.    Dispense:  60 tablet    Refill:  0  . ALPRAZolam (XANAX) 0.25 MG tablet    Sig: Take 1 tablet (0.25 mg total) by mouth 2 (two) times daily as needed. for anxiety    Dispense:  30 tablet    Refill:  5     Penni Homans, MD

## 2018-07-28 NOTE — Assessment & Plan Note (Signed)
Given refill on Albuterol to use prn

## 2018-07-28 NOTE — Assessment & Plan Note (Signed)
Stable on current meds no changes 

## 2018-07-28 NOTE — Assessment & Plan Note (Signed)
hgba1c acceptable, minimize simple carbs. Increase exercise as tolerated.  

## 2018-08-28 ENCOUNTER — Other Ambulatory Visit: Payer: Self-pay | Admitting: Family Medicine

## 2018-08-28 DIAGNOSIS — M549 Dorsalgia, unspecified: Secondary | ICD-10-CM

## 2018-08-28 DIAGNOSIS — G9050 Complex regional pain syndrome I, unspecified: Secondary | ICD-10-CM

## 2018-08-29 ENCOUNTER — Telehealth: Payer: Self-pay | Admitting: Family Medicine

## 2018-08-29 NOTE — Telephone Encounter (Signed)
Requesting:[place] Contract:yes UDS:low risk next screen 01/21/19 Last OV: 07/22/18 Next OV:01/23/19 Last Refill:10/719  #[id number] Database:   Please advise

## 2018-08-29 NOTE — Telephone Encounter (Signed)
Copied from [place] [phone] [phone]. Topic: Quick Communication - Rx Refill/Question >> Aug 29, 2018  2:27 PM Blase Mess A wrote: Medication: HYDROcodone-acetaminophen ([place]) 7.5-325 MG tablet [[id number]]  Has the patient contacted their pharmacy? Yes  (Agent: If no, request that the patient contact the pharmacy for the refill.) (Agent: If yes, when and what did the pharmacy advise?)  Preferred Pharmacy (with phone number or street name): [facility], [address] [place] [address] [place] [address] Phone: [phone] Fax: [phone]    Agent: Please be advised that RX refills may take up to 3 business days. We ask that you follow-up with your pharmacy.

## 2018-08-29 NOTE — Telephone Encounter (Signed)
Sent refill in another encounter.

## 2018-08-29 NOTE — Telephone Encounter (Signed)
Requesting: Contract: UDS: Last OV: Next OV: Last Refill: Database:   Please advise

## 2018-09-06 ENCOUNTER — Encounter: Payer: Self-pay | Admitting: Family Medicine

## 2018-09-06 ENCOUNTER — Ambulatory Visit (INDEPENDENT_AMBULATORY_CARE_PROVIDER_SITE_OTHER): Payer: Medicare Other | Admitting: Family Medicine

## 2018-09-06 DIAGNOSIS — J019 Acute sinusitis, unspecified: Secondary | ICD-10-CM

## 2018-09-06 DIAGNOSIS — R739 Hyperglycemia, unspecified: Secondary | ICD-10-CM | POA: Diagnosis not present

## 2018-09-06 DIAGNOSIS — K219 Gastro-esophageal reflux disease without esophagitis: Secondary | ICD-10-CM

## 2018-09-06 DIAGNOSIS — K5900 Constipation, unspecified: Secondary | ICD-10-CM | POA: Diagnosis not present

## 2018-09-06 DIAGNOSIS — B9689 Other specified bacterial agents as the cause of diseases classified elsewhere: Secondary | ICD-10-CM

## 2018-09-06 MED ORDER — ALBUTEROL SULFATE HFA 108 (90 BASE) MCG/ACT IN AERS: 2.0000 | INHALATION_SPRAY | Freq: Four times a day (QID) | RESPIRATORY_TRACT | 0 refills | Status: DC | PRN

## 2018-09-06 MED ORDER — FLUTICASONE PROPIONATE 50 MCG/ACT NA SUSP: 2.0000 | Freq: Every day | NASAL | 6 refills | Status: DC

## 2018-09-06 MED ORDER — CETIRIZINE HCL 10 MG PO TABS: 10.0000 mg | ORAL_TABLET | Freq: Every day | ORAL | 11 refills | Status: DC

## 2018-09-06 MED ORDER — AMOXICILLIN 500 MG PO CAPS: 500.0000 mg | ORAL_CAPSULE | Freq: Three times a day (TID) | ORAL | 0 refills | Status: DC

## 2018-09-06 MED ORDER — HYDROCODONE-HOMATROPINE 5-1.5 MG/5ML PO SYRP: 5.0000 mL | ORAL_SOLUTION | Freq: Three times a day (TID) | ORAL | 0 refills | Status: DC | PRN

## 2018-09-06 NOTE — Assessment & Plan Note (Addendum)
hgba1c acceptable, minimize simple carbs. Increase exercise as tolerated.  

## 2018-09-06 NOTE — Patient Instructions (Signed)

## 2018-09-08 NOTE — Assessment & Plan Note (Signed)
Encouraged increased rest and hydration, add probiotics, zinc such as Coldeze or Xicam. Treat fevers as needed. Antibiotics, Albuterol, cough med.

## 2018-09-08 NOTE — Assessment & Plan Note (Signed)
Maintain hydration and fiber in diet. Daily probiotics. If bowels not moving can use MOM 2 tbls po in 4 oz of warm prune juice by mouth every 2-3 days. If no results then repeat in 4 hours with  Dulcolax suppository pr, may repeat again in 4 more hours as needed. Seek care if symptoms worsen during illness.

## 2018-09-08 NOTE — Assessment & Plan Note (Signed)
Avoid offending foods, start probiotics. Do not eat large meals in late evening and consider raising head of bed.  

## 2018-09-08 NOTE — Progress Notes (Signed)
Subjective:    Patient ID: Beverly Erickson, female    DOB: [birth year], 47 y.o.   MRN: [id number]  Chief Complaint  Patient presents with  . Cough    x 1wk  . Nasal Congestion    HPI Patient is in today for evaluation of respiratory illness that has been present roughly 2 weeks. She has noted head congestion and cough. She has chest tightness and PND, she notes cough productive of clear phlegm and a headache. No ear pain or sore throat. She notes chills and chest tightness as well as fatigue. Nasal congestion also. Has not been able to sleep due to cough.   Past Medical History:  Diagnosis Date  . Anemia   . Chicken pox   . Depression   . Depression with anxiety 08/30/2015  . GERD (gastroesophageal reflux disease)   . Kidney stone 02/13/2014   Right, small  . Neck pain, musculoskeletal 11/17/2016  . Obesity   . Other and unspecified hyperlipidemia 01/14/2014  . Otitis media 11/17/2016  . Preventative health care 07/05/2014  . RSD (reflex sympathetic dystrophy)   . Tinea corporis 06/10/2017  . Tobacco use disorder 01/14/2014   1ppd  . Unspecified constipation 05/25/2014  . Urinary frequency 03/06/2017    Past Surgical History:  Procedure Laterality Date  . CESAREAN SECTION  2000  . plate and screws in left arm  2005   humerus  . WISDOM TOOTH EXTRACTION  47 yrs old    Family History  Problem Relation Age of Onset  . Arthritis Mother        Living  . Hypertension Mother   . Thrombocytopenia Mother   . Hyperlipidemia Father   . Hypertension Father   . Thrombocytopenia Maternal Grandmother   . Stroke Maternal Grandmother   . Hypertension Maternal Grandmother   . Alcohol abuse Maternal Grandfather   . Heart disease Maternal Grandfather   . Parkinson's disease Maternal Grandfather   . Alcohol abuse Paternal Grandfather   . Cancer Paternal Grandfather   . Hypothyroidism Daughter   . Diabetes Daughter        type 1  . Dementia Paternal Grandmother     Social History    Socioeconomic History  . Marital status: Widowed    Spouse name: Not on file  . Number of children: 3  . Years of education: Not on file  . Highest education level: Not on file  Occupational History  . Not on file  Social Needs  . Financial resource strain: Not on file  . Food insecurity:    Worry: Not on file    Inability: Not on file  . Transportation needs:    Medical: Not on file    Non-medical: Not on file  Tobacco Use  . Smoking status: Current Every Day Smoker    Types: Cigarettes  . Smokeless tobacco: Never Used  Substance and Sexual Activity  . Alcohol use: Yes    Comment: once a year.  . Drug use: No  . Sexual activity: Not on file    Comment: lives with son, 13 yo daughter, boyfriend, no dietary  Lifestyle  . Physical activity:    Days per week: Not on file    Minutes per session: Not on file  . Stress: Not on file  Relationships  . Social connections:    Talks on phone: Not on file    Gets together: Not on file    Attends religious service: Not on file    Active  member of club or organization: Not on file    Attends meetings of clubs or organizations: Not on file    Relationship status: Not on file  . Intimate partner violence:    Fear of current or ex partner: Not on file    Emotionally abused: Not on file    Physically abused: Not on file    Forced sexual activity: Not on file  Other Topics Concern  . Not on file  Social History Narrative  . Not on file    Outpatient Medications Prior to Visit  Medication Sig Dispense Refill  . albuterol (PROVENTIL HFA;VENTOLIN HFA) 108 (90 Base) MCG/ACT inhaler Inhale 2 puffs into the lungs every 6 (six) hours as needed for wheezing or shortness of breath. 2 Inhaler 2  . ALPRAZolam (XANAX) 0.25 MG tablet Take 1 tablet (0.25 mg total) by mouth 2 (two) times daily as needed. for anxiety 30 tablet 5  . aspirin 81 MG tablet Take 81 mg by mouth daily.    Marland Kitchen buPROPion (WELLBUTRIN SR) 150 MG 12 hr tablet one tablet by  mouth twice daily 60 tablet 2  . carisoprodol (SOMA) 350 MG tablet Take 1 tablet (350 mg total) by mouth 2 (two) times daily as needed for muscle spasms. 60 tablet 01  . HYDROcodone-acetaminophen (NORCO) 7.5-325 MG tablet TAKE 2 TABLETS BY MOUTH 2 (TWO) TIMES DAILY AS NEEDED. 60 tablet 0  . KRILL OIL PO Take 1 capsule by mouth daily.    . methylPREDNISolone (MEDROL) 4 MG tablet 5 tab po qd X 1d then 4 tab po qd X 1d then 3 tab po qd X 1d then 2 tab po qd then 1 tab po qd 15 tablet 0  . Misc. Devices (ROLLATOR) MISC Patient with severe pain and weakness in left leg Reflex Sympathetic Dystrophy 1 each 0  . nitroGLYCERIN (NITROSTAT) 0.4 MG SL tablet Place 1 tablet (0.4 mg total) under the tongue every 5 (five) minutes as needed for chest pain. 25 tablet 1  . omeprazole (PRILOSEC) 40 MG capsule Take 1 capsule (40 mg total) by mouth 2 (two) times daily. 180 capsule 1  . Potassium 75 MG TABS Take 1 each by mouth at bedtime.    . pramipexole (MIRAPEX) 1 MG tablet Take 1 tablet (1 mg total) by mouth at bedtime. 30 tablet 2  . ranitidine (ZANTAC) 300 MG tablet Take 1 tablet (300 mg total) by mouth daily as needed for heartburn. 90 tablet 1   No facility-administered medications prior to visit.     Allergies  Allergen Reactions  . Codeine Nausea Only  . Mucinex [Guaifenesin Er] Other (See Comments)    Nightmares    Review of Systems  Constitutional: Positive for chills and malaise/fatigue. Negative for fever.  HENT: Positive for congestion. Negative for ear pain and sore throat.   Eyes: Negative for blurred vision.  Respiratory: Positive for cough, sputum production and wheezing. Negative for hemoptysis and shortness of breath.   Cardiovascular: Negative for chest pain, palpitations and leg swelling.  Gastrointestinal: Negative for abdominal pain, blood in stool and nausea.  Genitourinary: Negative for dysuria and frequency.  Musculoskeletal: Positive for myalgias. Negative for falls.  Skin:  Negative for rash.  Neurological: Positive for headaches. Negative for dizziness and loss of consciousness.  Endo/Heme/Allergies: Negative for environmental allergies.  Psychiatric/Behavioral: Negative for depression. The patient is not nervous/anxious.        Objective:    Physical Exam  Constitutional: She is oriented to person, place,  and time. She appears well-developed and well-nourished. No distress.  HENT:  Head: Normocephalic and atraumatic.  Nose: Nose normal.  Eyes: Right eye exhibits no discharge. Left eye exhibits no discharge.  Neck: Normal range of motion. Neck supple.  Cardiovascular: Normal rate and regular rhythm.  No murmur heard. Pulmonary/Chest: Effort normal and breath sounds normal.  Decreased breath sounds bilateral bases  Abdominal: Soft. Bowel sounds are normal. There is no tenderness.  Musculoskeletal: She exhibits no edema.  Neurological: She is alert and oriented to person, place, and time.  Skin: Skin is warm and dry.  Psychiatric: She has a normal mood and affect.  Nursing note and vitals reviewed.   BP (!) 110/56 (BP Location: Left Arm, Patient Position: Sitting, Cuff Size: Large)   Pulse 82   Temp 98.1 F (36.7 C) (Oral)   Resp 18   Ht 5\' 7"  (1.702 m)   Wt 243 lb (110.2 kg)   SpO2 98%   BMI 38.06 kg/m  Wt Readings from Last 3 Encounters:  09/06/18 243 lb (110.2 kg)  07/22/18 251 lb (113.9 kg)  01/22/18 290 lb 9.6 oz (131.8 kg)     Lab Results  Component Value Date   WBC 6.7 01/22/2018   HGB 14.1 01/22/2018   HCT 41.6 01/22/2018   PLT 249.0 01/22/2018   GLUCOSE 106 (H) 01/22/2018   CHOL 141 01/22/2018   TRIG 78.0 01/22/2018   HDL 41.40 01/22/2018   LDLCALC 84 01/22/2018   ALT 19 01/22/2018   AST 14 01/22/2018   NA 139 01/22/2018   K 3.6 01/22/2018   CL 107 01/22/2018   CREATININE 0.74 01/22/2018   BUN 14 01/22/2018   CO2 26 01/22/2018   TSH 1.24 01/22/2018   HGBA1C 5.7 01/22/2018    Lab Results  Component Value Date     TSH 1.24 01/22/2018   Lab Results  Component Value Date   WBC 6.7 01/22/2018   HGB 14.1 01/22/2018   HCT 41.6 01/22/2018   MCV 88.7 01/22/2018   PLT 249.0 01/22/2018   Lab Results  Component Value Date   NA 139 01/22/2018   K 3.6 01/22/2018   CO2 26 01/22/2018   GLUCOSE 106 (H) 01/22/2018   BUN 14 01/22/2018   CREATININE 0.74 01/22/2018   BILITOT 0.3 01/22/2018   ALKPHOS 59 01/22/2018   AST 14 01/22/2018   ALT 19 01/22/2018   PROT 6.7 01/22/2018   ALBUMIN 3.7 01/22/2018   CALCIUM 8.7 01/22/2018   GFR 89.85 01/22/2018   Lab Results  Component Value Date   CHOL 141 01/22/2018   Lab Results  Component Value Date   HDL 41.40 01/22/2018   Lab Results  Component Value Date   LDLCALC 84 01/22/2018   Lab Results  Component Value Date   TRIG 78.0 01/22/2018   Lab Results  Component Value Date   CHOLHDL 3 01/22/2018   Lab Results  Component Value Date   HGBA1C 5.7 01/22/2018       Assessment & Plan:   Problem List Items Addressed This Visit    GERD (gastroesophageal reflux disease)    Avoid offending foods, start probiotics. Do not eat large meals in late evening and consider raising head of bed.       Hyperglycemia    hgba1c acceptable, minimize simple carbs. Increase exercise as tolerated.       Constipation    Maintain hydration and fiber in diet. Daily probiotics. If bowels not moving can use MOM 2 tbls  po in 4 oz of warm prune juice by mouth every 2-3 days. If no results then repeat in 4 hours with  Dulcolax suppository pr, may repeat again in 4 more hours as needed. Seek care if symptoms worsen during illness.       Acute bacterial sinusitis    Encouraged increased rest and hydration, add probiotics, zinc such as Coldeze or Xicam. Treat fevers as needed. Antibiotics, Albuterol, cough med.       Relevant Medications   fluticasone (FLONASE) 50 MCG/ACT nasal spray   cetirizine (ZYRTEC) 10 MG tablet   amoxicillin (AMOXIL) 500 MG capsule    HYDROcodone-homatropine (HYCODAN) 5-1.5 MG/5ML syrup      I am having Tonny Bollman start on fluticasone, cetirizine, amoxicillin, HYDROcodone-homatropine, and albuterol. I am also having her maintain her Potassium, aspirin, KRILL OIL PO, ROLLATOR, pramipexole, ranitidine, carisoprodol, omeprazole, nitroGLYCERIN, methylPREDNISolone, buPROPion, albuterol, ALPRAZolam, and HYDROcodone-acetaminophen.  Meds ordered this encounter  Medications  . fluticasone (FLONASE) 50 MCG/ACT nasal spray    Sig: Place 2 sprays into both nostrils daily.    Dispense:  16 g    Refill:  6  . cetirizine (ZYRTEC) 10 MG tablet    Sig: Take 1 tablet (10 mg total) by mouth daily.    Dispense:  30 tablet    Refill:  11  . amoxicillin (AMOXIL) 500 MG capsule    Sig: Take 1 capsule (500 mg total) by mouth 3 (three) times daily.    Dispense:  30 capsule    Refill:  0  . HYDROcodone-homatropine (HYCODAN) 5-1.5 MG/5ML syrup    Sig: Take 5 mLs by mouth every 8 (eight) hours as needed for cough.    Dispense:  120 mL    Refill:  0  . albuterol (PROVENTIL HFA;VENTOLIN HFA) 108 (90 Base) MCG/ACT inhaler    Sig: Inhale 2 puffs into the lungs every 6 (six) hours as needed for wheezing or shortness of breath.    Dispense:  1 Inhaler    Refill:  0     Penni Homans, MD

## 2018-09-17 MED FILL — ALPRAZolam 0.25 MG TABS: 0.25 | 15 days supply | Qty: 30 | Fill #1

## 2018-09-17 MED FILL — HYDROCODON-APAP 7.5-325: 7.5-325 | 15 days supply | Qty: 60 | Fill #0

## 2018-10-18 MED FILL — AMOXICILLIN 500 MG CAPSULE: 500 | 10 days supply | Qty: 30 | Fill #0

## 2019-01-23 ENCOUNTER — Encounter: Payer: Self-pay | Admitting: Family Medicine

## 2019-03-25 ENCOUNTER — Other Ambulatory Visit: Payer: Self-pay | Admitting: Family Medicine

## 2019-03-25 ENCOUNTER — Telehealth: Payer: Self-pay | Admitting: Family Medicine

## 2019-03-25 DIAGNOSIS — G9050 Complex regional pain syndrome I, unspecified: Secondary | ICD-10-CM

## 2019-03-25 DIAGNOSIS — M549 Dorsalgia, unspecified: Secondary | ICD-10-CM

## 2019-03-25 NOTE — Telephone Encounter (Signed)
CALLED PT TO RESDCH HER CPE PER CRM REQUEST ... LEFT VM

## 2019-03-26 NOTE — Telephone Encounter (Signed)
Requesting: Xanax & Norco Contract:yes UDS:low risk next screen 01/22/19 Last OV:09/06/18 Next OV:05/19/19 Last Refill: Xanax: 07/22/18  #[id number] Norco: 08/29/18  #[id number] Database:   Please advise

## 2019-03-27 MED FILL — HYDROCODON-APAP 7.5-325: 7.5-325 | 15 days supply | Qty: 60 | Fill #0

## 2019-05-19 ENCOUNTER — Ambulatory Visit (INDEPENDENT_AMBULATORY_CARE_PROVIDER_SITE_OTHER): Payer: Medicare Other | Admitting: Family Medicine

## 2019-05-19 ENCOUNTER — Other Ambulatory Visit: Payer: Self-pay

## 2019-05-19 ENCOUNTER — Encounter: Payer: Self-pay | Admitting: Family Medicine

## 2019-05-19 VITALS — BP 117/70 | HR 58 | Temp 98.0°F | Resp 16 | Ht 67.0 in | Wt 200.4 lb

## 2019-05-19 DIAGNOSIS — M549 Dorsalgia, unspecified: Secondary | ICD-10-CM | POA: Diagnosis not present

## 2019-05-19 DIAGNOSIS — E782 Mixed hyperlipidemia: Secondary | ICD-10-CM | POA: Diagnosis not present

## 2019-05-19 DIAGNOSIS — R739 Hyperglycemia, unspecified: Secondary | ICD-10-CM | POA: Diagnosis not present

## 2019-05-19 DIAGNOSIS — G9050 Complex regional pain syndrome I, unspecified: Secondary | ICD-10-CM

## 2019-05-19 DIAGNOSIS — C539 Malignant neoplasm of cervix uteri, unspecified: Secondary | ICD-10-CM

## 2019-05-19 DIAGNOSIS — Z1239 Encounter for other screening for malignant neoplasm of breast: Secondary | ICD-10-CM

## 2019-05-19 DIAGNOSIS — Z0000 Encounter for general adult medical examination without abnormal findings: Secondary | ICD-10-CM

## 2019-05-19 LAB — CBC
HCT: 41.8 % (ref 36.0–46.0)
Hemoglobin: 14.1 g/dL (ref 12.0–15.0)
MCHC: 33.7 g/dL (ref 30.0–36.0)
MCV: 92.2 fl (ref 78.0–100.0)
Platelets: 225 10*3/uL (ref 150.0–400.0)
RBC: 4.54 Mil/uL (ref 3.87–5.11)
RDW: 13.1 % (ref 11.5–15.5)
WBC: 6.1 10*3/uL (ref 4.0–10.5)

## 2019-05-19 LAB — LIPID PANEL
Cholesterol: 154 mg/dL (ref 0–200)
HDL: 43 mg/dL (ref >39.00)
LDL Cholesterol: 101 mg/dL — ABNORMAL HIGH (ref 0–99)
NonHDL: 111.1
Total CHOL/HDL Ratio: 4
Triglycerides: 52 mg/dL (ref 0.0–149.0)
VLDL: 10.4 mg/dL (ref 0.0–40.0)

## 2019-05-19 LAB — COMPREHENSIVE METABOLIC PANEL
ALT: 10 U/L (ref 0–35)
AST: 11 U/L (ref 0–37)
Albumin: 4.2 g/dL (ref 3.5–5.2)
Alkaline Phosphatase: 59 U/L (ref 39–117)
BUN: 22 mg/dL (ref 6–23)
CO2: 24 mEq/L (ref 19–32)
Calcium: 9.4 mg/dL (ref 8.4–10.5)
Chloride: 110 mEq/L (ref 96–112)
Creatinine, Ser: 0.74 mg/dL (ref 0.40–1.20)
GFR: 84.05 mL/min (ref >60.00)
Glucose, Bld: 72 mg/dL (ref 70–99)
Potassium: 4.5 mEq/L (ref 3.5–5.1)
Sodium: 142 mEq/L (ref 135–145)
Total Bilirubin: 0.4 mg/dL (ref 0.2–1.2)
Total Protein: 6.6 g/dL (ref 6.0–8.3)

## 2019-05-19 LAB — TSH: TSH: 1.08 u[IU]/mL (ref 0.35–4.50)

## 2019-05-19 LAB — HEMOGLOBIN A1C: Hgb A1c MFr Bld: 5.3 % (ref 4.6–6.5)

## 2019-05-19 MED ORDER — HYDROCODONE-ACETAMINOPHEN 7.5-325 MG PO TABS: 2.0000 | ORAL_TABLET | Freq: Two times a day (BID) | ORAL | 0 refills | Status: DC | PRN

## 2019-05-19 MED FILL — HYDROCODON-APAP 7.5-325: 7.5-325 | 19 days supply | Qty: 75 | Fill #0

## 2019-05-19 NOTE — Assessment & Plan Note (Addendum)
Refill given on hydrocodone she has had to start working part time and her pain is increased. She is allowed #75 but advised to alternate Tylenol and Advil to manage her pain first. Given the daily limits of all meds on her AVS

## 2019-05-19 NOTE — Progress Notes (Signed)
Subjective:    Patient ID: Beverly Erickson, female    DOB: June 01, 1971, 48 y.o.   MRN: [id number]  Chief Complaint  Patient presents with   Annual Exam    HPI Patient is in today for annual preventativ exam and follow up on chronic medical concerns including obesity, RSD, hyperglycemia and more. She has had to start working part time and as a result her RSD has flared. Her pain offer some relief but wear off too soon. No recent febrile illness or hospitalizations. No polyuria or polydipsia. She is not exercising or maintaining a heart healthy diet most days. Is trying to quarantine when not working. Denies CP/palp/SOB/HA/congestion/fevers/GI or GU c/o. Taking meds as prescribed  Past Medical History:  Diagnosis Date   Anemia    Chicken pox    Depression    Depression with anxiety 08/30/2015   GERD (gastroesophageal reflux disease)    Kidney stone 02/13/2014   Right, small   Neck pain, musculoskeletal 11/17/2016   Obesity    Other and unspecified hyperlipidemia 01/14/2014   Otitis media 11/17/2016   Preventative health care 07/05/2014   RSD (reflex sympathetic dystrophy)    Tinea corporis 06/10/2017   Tobacco use disorder 01/14/2014   1ppd   Unspecified constipation 05/25/2014   Urinary frequency 03/06/2017    Past Surgical History:  Procedure Laterality Date   CESAREAN SECTION  2000   plate and screws in left arm  2005   humerus   WISDOM TOOTH EXTRACTION  48 yrs old    Family History  Problem Relation Age of Onset   Arthritis Mother        Living   Hypertension Mother    Thrombocytopenia Mother    Hyperlipidemia Father    Hypertension Father    Thrombocytopenia Maternal Grandmother    Stroke Maternal Grandmother    Hypertension Maternal Grandmother    Alcohol abuse Maternal Grandfather    Heart disease Maternal Grandfather    Parkinson's disease Maternal Grandfather    Alcohol abuse Paternal Grandfather    Cancer Paternal Grandfather     Hypothyroidism Daughter    Diabetes Daughter        type 1   Dementia Paternal Grandmother     Social History   Socioeconomic History   Marital status: Widowed    Spouse name: Not on file   Number of children: 3   Years of education: Not on file   Highest education level: Not on file  Occupational History   Not on file  Social Needs   Financial resource strain: Not on file   Food insecurity    Worry: Not on file    Inability: Not on file   Transportation needs    Medical: Not on file    Non-medical: Not on file  Tobacco Use   Smoking status: Current Every Day Smoker    Types: Cigarettes   Smokeless tobacco: Never Used  Substance and Sexual Activity   Alcohol use: Yes    Comment: once a year.   Drug use: No   Sexual activity: Not on file    Comment: lives with son, 37 yo daughter, boyfriend, no dietary  Lifestyle   Physical activity    Days per week: Not on file    Minutes per session: Not on file   Stress: Not on file  Relationships   Social connections    Talks on phone: Not on file    Gets together: Not on file    Attends  religious service: Not on file    Active member of club or organization: Not on file    Attends meetings of clubs or organizations: Not on file    Relationship status: Not on file   Intimate partner violence    Fear of current or ex partner: Not on file    Emotionally abused: Not on file    Physically abused: Not on file    Forced sexual activity: Not on file  Other Topics Concern   Not on file  Social History Narrative   Not on file    Outpatient Medications Prior to Visit  Medication Sig Dispense Refill   albuterol (PROVENTIL HFA;VENTOLIN HFA) 108 (90 Base) MCG/ACT inhaler Inhale 2 puffs into the lungs every 6 (six) hours as needed for wheezing or shortness of breath. 2 Inhaler 2   albuterol (PROVENTIL HFA;VENTOLIN HFA) 108 (90 Base) MCG/ACT inhaler Inhale 2 puffs into the lungs every 6 (six) hours as needed for  wheezing or shortness of breath. 1 Inhaler 0   ALPRAZolam (XANAX) 0.25 MG tablet TAKE 1 TABLET (0.25 MG TOTAL) BY MOUTH 2 (TWO) TIMES DAILY AS NEEDED FOR ANXIETY 30 tablet 5   aspirin 81 MG tablet Take 81 mg by mouth daily.     buPROPion (WELLBUTRIN SR) 150 MG 12 hr tablet one tablet by mouth twice daily 60 tablet 2   carisoprodol (SOMA) 350 MG tablet Take 1 tablet (350 mg total) by mouth 2 (two) times daily as needed for muscle spasms. 60 tablet 01   cetirizine (ZYRTEC) 10 MG tablet Take 1 tablet (10 mg total) by mouth daily. 30 tablet 11   fluticasone (FLONASE) 50 MCG/ACT nasal spray Place 2 sprays into both nostrils daily. 16 g 6   KRILL OIL PO Take 1 capsule by mouth daily.     Misc. Devices (ROLLATOR) MISC Patient with severe pain and weakness in left leg Reflex Sympathetic Dystrophy 1 each 0   nitroGLYCERIN (NITROSTAT) 0.4 MG SL tablet Place 1 tablet (0.4 mg total) under the tongue every 5 (five) minutes as needed for chest pain. 25 tablet 1   omeprazole (PRILOSEC) 40 MG capsule Take 1 capsule (40 mg total) by mouth 2 (two) times daily. 180 capsule 1   Potassium 75 MG TABS Take 1 each by mouth at bedtime.     pramipexole (MIRAPEX) 1 MG tablet Take 1 tablet (1 mg total) by mouth at bedtime. 30 tablet 2   ranitidine (ZANTAC) 300 MG tablet Take 1 tablet (300 mg total) by mouth daily as needed for heartburn. 90 tablet 1   amoxicillin (AMOXIL) 500 MG capsule Take 1 capsule (500 mg total) by mouth 3 (three) times daily. 30 capsule 0   HYDROcodone-acetaminophen (NORCO) 7.5-325 MG tablet TAKE 2 TABLETS BY MOUTH 2 (TWO) TIMES DAILY AS NEEDED. 60 tablet 0   HYDROcodone-homatropine (HYCODAN) 5-1.5 MG/5ML syrup Take 5 mLs by mouth every 8 (eight) hours as needed for cough. 120 mL 0   methylPREDNISolone (MEDROL) 4 MG tablet 5 tab po qd X 1d then 4 tab po qd X 1d then 3 tab po qd X 1d then 2 tab po qd then 1 tab po qd 15 tablet 0   No facility-administered medications prior to visit.      Allergies  Allergen Reactions   Codeine Nausea Only   Mucinex [Guaifenesin Er] Other (See Comments)    Nightmares    Review of Systems  Constitutional: Positive for malaise/fatigue. Negative for chills and fever.  HENT: Negative  for congestion and hearing loss.   Eyes: Negative for discharge.  Respiratory: Negative for cough, sputum production and shortness of breath.   Cardiovascular: Negative for chest pain, palpitations and leg swelling.  Gastrointestinal: Negative for abdominal pain, blood in stool, constipation, diarrhea, heartburn, nausea and vomiting.  Genitourinary: Negative for dysuria, frequency, hematuria and urgency.  Musculoskeletal: Positive for joint pain and myalgias. Negative for back pain and falls.  Skin: Negative for rash.  Neurological: Negative for dizziness, sensory change, loss of consciousness, weakness and headaches.  Endo/Heme/Allergies: Negative for environmental allergies. Does not bruise/bleed easily.  Psychiatric/Behavioral: Negative for depression and suicidal ideas. The patient is not nervous/anxious and does not have insomnia.        Objective:    Physical Exam Constitutional:      General: She is not in acute distress.    Appearance: She is well-developed.  HENT:     Head: Normocephalic and atraumatic.  Eyes:     Conjunctiva/sclera: Conjunctivae normal.  Neck:     Musculoskeletal: Neck supple.     Thyroid: No thyromegaly.  Cardiovascular:     Rate and Rhythm: Normal rate and regular rhythm.     Heart sounds: Normal heart sounds. No murmur.  Pulmonary:     Effort: Pulmonary effort is normal. No respiratory distress.     Breath sounds: Normal breath sounds.  Abdominal:     General: Bowel sounds are normal. There is no distension.     Palpations: Abdomen is soft. There is no mass.     Tenderness: There is no abdominal tenderness.  Lymphadenopathy:     Cervical: No cervical adenopathy.  Skin:    General: Skin is warm and dry.   Neurological:     Mental Status: She is alert and oriented to person, place, and time.  Psychiatric:        Behavior: Behavior normal.     BP 117/70    Pulse (!) 58    Temp 98 F (36.7 C) (Oral)    Resp 16    Ht 5\' 7"  (1.702 m)    Wt 200 lb 6.4 oz (90.9 kg)    SpO2 100%    BMI 31.39 kg/m  Wt Readings from Last 3 Encounters:  05/19/19 200 lb 6.4 oz (90.9 kg)  09/06/18 243 lb (110.2 kg)  07/22/18 251 lb (113.9 kg)    Diabetic Foot Exam - Simple   No data filed     Lab Results  Component Value Date   WBC 6.1 05/19/2019   HGB 14.1 05/19/2019   HCT 41.8 05/19/2019   PLT 225.0 05/19/2019   GLUCOSE 72 05/19/2019   CHOL 154 05/19/2019   TRIG 52.0 05/19/2019   HDL 43.00 05/19/2019   LDLCALC 101 (H) 05/19/2019   ALT 10 05/19/2019   AST 11 05/19/2019   NA 142 05/19/2019   K 4.5 05/19/2019   CL 110 05/19/2019   CREATININE 0.74 05/19/2019   BUN 22 05/19/2019   CO2 24 05/19/2019   TSH 1.08 05/19/2019   HGBA1C 5.3 05/19/2019    Lab Results  Component Value Date   TSH 1.08 05/19/2019   Lab Results  Component Value Date   WBC 6.1 05/19/2019   HGB 14.1 05/19/2019   HCT 41.8 05/19/2019   MCV 92.2 05/19/2019   PLT 225.0 05/19/2019   Lab Results  Component Value Date   NA 142 05/19/2019   K 4.5 05/19/2019   CO2 24 05/19/2019   GLUCOSE 72 05/19/2019  BUN 22 05/19/2019   CREATININE 0.74 05/19/2019   BILITOT 0.4 05/19/2019   ALKPHOS 59 05/19/2019   AST 11 05/19/2019   ALT 10 05/19/2019   PROT 6.6 05/19/2019   ALBUMIN 4.2 05/19/2019   CALCIUM 9.4 05/19/2019   GFR 84.05 05/19/2019   Lab Results  Component Value Date   CHOL 154 05/19/2019   Lab Results  Component Value Date   HDL 43.00 05/19/2019   Lab Results  Component Value Date   LDLCALC 101 (H) 05/19/2019   Lab Results  Component Value Date   TRIG 52.0 05/19/2019   Lab Results  Component Value Date   CHOLHDL 4 05/19/2019   Lab Results  Component Value Date   HGBA1C 5.3 05/19/2019        Assessment & Plan:   Problem List Items Addressed This Visit    RSD (reflex sympathetic dystrophy)    Refill given on hydrocodone she has had to start working part time and her pain is increased. She is allowed #75 but advised to alternate Tylenol and Advil to manage her pain first. Given the daily limits of all meds on her AVS      Relevant Medications   HYDROcodone-acetaminophen (NORCO) 7.5-325 MG tablet   Hyperglycemia   Relevant Orders   CBC (Completed)   TSH (Completed)   Hemoglobin A1c (Completed)   Hyperlipidemia, mixed - Primary   Relevant Orders   Comprehensive metabolic panel (Completed)   Lipid panel (Completed)   TSH (Completed)   Mid back pain on right side   Relevant Medications   HYDROcodone-acetaminophen (NORCO) 7.5-325 MG tablet   Preventative health care    Patient encouraged to maintain heart healthy diet, regular exercise, adequate sleep. Consider daily probiotics. Take medications as prescribed. Labs ordered and reviewed      Relevant Orders   TSH (Completed)   Breast cancer screening   Relevant Orders   MM 3D SCREEN BREAST BILATERAL    Other Visit Diagnoses    Malignant neoplasm of cervix, unspecified site [facility])       Relevant Orders   Ambulatory referral to Obstetrics / Gynecology      I have discontinued Ivin Booty Manard's methylPREDNISolone, amoxicillin, and HYDROcodone-homatropine. I am also having her maintain her Potassium, aspirin, KRILL OIL PO, Rollator, pramipexole, ranitidine, carisoprodol, omeprazole, nitroGLYCERIN, buPROPion, albuterol, fluticasone, cetirizine, albuterol, ALPRAZolam, and HYDROcodone-acetaminophen.  Meds ordered this encounter  Medications   HYDROcodone-acetaminophen (NORCO) 7.5-325 MG tablet    Sig: Take 2 tablets by mouth 2 (two) times daily as needed.    Dispense:  75 tablet    Refill:  0     Penni Homans, MD

## 2019-05-19 NOTE — Patient Instructions (Signed)
Tylenol/Acetaminophen/APAP max of 3000 mg in 24 hours (max of 1000 mg per dose)  Advil/Motrin/Ibuprfen max of 2400 mg in 24 hours  2 tabs every 6 hours as needed of Advil Preventive Care 3-47 Years Old, Female Preventive care refers to visits with your health care provider and lifestyle choices that can promote health and wellness. This includes:  A yearly physical exam. This may also be called an annual well check.  Regular dental visits and eye exams.  Immunizations.  Screening for certain conditions.  Healthy lifestyle choices, such as eating a healthy diet, getting regular exercise, not using drugs or products that contain nicotine and tobacco, and limiting alcohol use. What can I expect for my preventive care visit? Physical exam Your health care provider will check your:  Height and weight. This may be used to calculate body mass index (BMI), which tells if you are at a healthy weight.  Heart rate and blood pressure.  Skin for abnormal spots. Counseling Your health care provider may ask you questions about your:  Alcohol, tobacco, and drug use.  Emotional well-being.  Home and relationship well-being.  Sexual activity.  Eating habits.  Work and work Statistician.  Method of birth control.  Menstrual cycle.  Pregnancy history. What immunizations do I need?  Influenza (flu) vaccine  This is recommended every year. Tetanus, diphtheria, and pertussis (Tdap) vaccine  You may need a Td booster every 10 years. Varicella (chickenpox) vaccine  You may need this if you have not been vaccinated. Zoster (shingles) vaccine  You may need this after age 52. Measles, mumps, and rubella (MMR) vaccine  You may need at least one dose of MMR if you were born in 1957 or later. You may also need a second dose. Pneumococcal conjugate (PCV13) vaccine  You may need this if you have certain conditions and were not previously vaccinated. Pneumococcal polysaccharide  (PPSV23) vaccine  You may need one or two doses if you smoke cigarettes or if you have certain conditions. Meningococcal conjugate (MenACWY) vaccine  You may need this if you have certain conditions. Hepatitis A vaccine  You may need this if you have certain conditions or if you travel or work in places where you may be exposed to hepatitis A. Hepatitis B vaccine  You may need this if you have certain conditions or if you travel or work in places where you may be exposed to hepatitis B. Haemophilus influenzae type b (Hib) vaccine  You may need this if you have certain conditions. Human papillomavirus (HPV) vaccine  If recommended by your health care provider, you may need three doses over 6 months. You may receive vaccines as individual doses or as more than one vaccine together in one shot (combination vaccines). Talk with your health care provider about the risks and benefits of combination vaccines. What tests do I need? Blood tests  Lipid and cholesterol levels. These may be checked every 5 years, or more frequently if you are over 72 years old.  Hepatitis C test.  Hepatitis B test. Screening  Lung cancer screening. You may have this screening every year starting at age 67 if you have a 30-pack-year history of smoking and currently smoke or have quit within the past 15 years.  Colorectal cancer screening. All adults should have this screening starting at age 47 and continuing until age 11. Your health care provider may recommend screening at age 35 if you are at increased risk. You will have tests every 1-10 years, depending on  your results and the type of screening test.  Diabetes screening. This is done by checking your blood sugar (glucose) after you have not eaten for a while (fasting). You may have this done every 1-3 years.  Mammogram. This may be done every 1-2 years. Talk with your health care provider about when you should start having regular mammograms. This may  depend on whether you have a family history of breast cancer.  BRCA-related cancer screening. This may be done if you have a family history of breast, ovarian, tubal, or peritoneal cancers.  Pelvic exam and Pap test. This may be done every 3 years starting at age 21. Starting at age 30, this may be done every 5 years if you have a Pap test in combination with an HPV test. Other tests  Sexually transmitted disease (STD) testing.  Bone density scan. This is done to screen for osteoporosis. You may have this scan if you are at high risk for osteoporosis. Follow these instructions at home: Eating and drinking  Eat a diet that includes fresh fruits and vegetables, whole grains, lean protein, and low-fat dairy.  Take vitamin and mineral supplements as recommended by your health care provider.  Do not drink alcohol if: ? Your health care provider tells you not to drink. ? You are pregnant, may be pregnant, or are planning to become pregnant.  If you drink alcohol: ? Limit how much you have to 0-1 drink a day. ? Be aware of how much alcohol is in your drink. In the [place] one drink equals one 12 oz bottle of beer (355 mL), one 5 oz glass of wine (148 mL), or one 1 oz glass of hard liquor (44 mL). Lifestyle  Take daily care of your teeth and gums.  Stay active. Exercise for at least 30 minutes on 5 or more days each week.  Do not use any products that contain nicotine or tobacco, such as cigarettes, e-cigarettes, and chewing tobacco. If you need help quitting, ask your health care provider.  If you are sexually active, practice safe sex. Use a condom or other form of birth control (contraception) in order to prevent pregnancy and STIs (sexually transmitted infections).  If told by your health care provider, take low-dose aspirin daily starting at age 50. What's next?  Visit your health care provider once a year for a well check visit.  Ask your health care provider how often you should  have your eyes and teeth checked.  Stay up to date on all vaccines. This information is not intended to replace advice given to you by your health care provider. Make sure you discuss any questions you have with your health care provider. Document Released: 10/29/2015 Document Revised: 06/13/2018 Document Reviewed: 06/13/2018 Elsevier Patient Education  2020 [employer]  

## 2019-05-19 NOTE — Assessment & Plan Note (Signed)
Patient encouraged to maintain heart healthy diet, regular exercise, adequate sleep. Consider daily probiotics. Take medications as prescribed. Labs ordered and reviewed 

## 2019-06-09 ENCOUNTER — Ambulatory Visit (HOSPITAL_BASED_OUTPATIENT_CLINIC_OR_DEPARTMENT_OTHER): Payer: Medicare Other

## 2019-06-17 ENCOUNTER — Other Ambulatory Visit: Payer: Self-pay | Admitting: Family Medicine

## 2019-06-17 DIAGNOSIS — G9050 Complex regional pain syndrome I, unspecified: Secondary | ICD-10-CM

## 2019-06-17 DIAGNOSIS — M549 Dorsalgia, unspecified: Secondary | ICD-10-CM

## 2019-06-18 MED FILL — HYDROCODON-APAP 7.5-325: 7.5-325 | 19 days supply | Qty: 75 | Fill #0

## 2019-06-18 NOTE — Telephone Encounter (Signed)
Requesting:Norco Contract:yes UDS:n/a/ Last OV:05/19/19 Next OV:09/18/19 Last Refill:05/19/19  #[phone] Database:   Please advise  /

## 2019-07-07 ENCOUNTER — Encounter: Payer: Medicare Other | Admitting: Obstetrics & Gynecology

## 2019-07-07 DIAGNOSIS — Z01419 Encounter for gynecological examination (general) (routine) without abnormal findings: Secondary | ICD-10-CM

## 2019-08-08 MED FILL — ALPRAZolam 0.25 MG TABS: 0.25 | 15 days supply | Qty: 30 | Fill #0

## 2019-09-04 ENCOUNTER — Other Ambulatory Visit: Payer: Self-pay | Admitting: Family Medicine

## 2019-09-04 DIAGNOSIS — M549 Dorsalgia, unspecified: Secondary | ICD-10-CM

## 2019-09-04 DIAGNOSIS — G9050 Complex regional pain syndrome I, unspecified: Secondary | ICD-10-CM

## 2019-09-05 MED FILL — HYDROCODON-APAP 7.5-325: 7.5-325 | 19 days supply | Qty: 75 | Fill #0

## 2019-09-05 NOTE — Telephone Encounter (Signed)
Requesting:norco  Contract:yes UDS:low risk  Last OV:05/19/19 Next OV:09/18/19 Last Refill:06/18/19  #[id number] Database:   Please advise

## 2019-09-16 MED FILL — HYDROCODON-APAP 7.5-325: 7.5-325 | 19 days supply | Qty: 75 | Fill #0

## 2019-09-18 ENCOUNTER — Ambulatory Visit: Payer: Medicare Other | Admitting: Family Medicine

## 2019-09-22 ENCOUNTER — Ambulatory Visit: Payer: Medicare Other | Admitting: Family Medicine

## 2019-09-22 ENCOUNTER — Other Ambulatory Visit: Payer: Self-pay

## 2019-09-22 DIAGNOSIS — R739 Hyperglycemia, unspecified: Secondary | ICD-10-CM

## 2019-09-22 NOTE — Progress Notes (Signed)
Patient did not answer phone for visit 

## 2019-10-28 ENCOUNTER — Other Ambulatory Visit: Payer: Self-pay | Admitting: Family Medicine

## 2019-10-28 DIAGNOSIS — G9050 Complex regional pain syndrome I, unspecified: Secondary | ICD-10-CM

## 2019-10-28 DIAGNOSIS — M549 Dorsalgia, unspecified: Secondary | ICD-10-CM

## 2019-10-28 NOTE — Telephone Encounter (Signed)
Requesting:Norco Contract:yes UDS:low risk next screen 11/22/18 Last OV:09/22/19 Next OV:n/a Last Refill:09/05/19  #[id number] Database:   Please advise

## 2019-10-30 MED FILL — HYDROCODON-APAP 7.5-325: 7.5-325 | 19 days supply | Qty: 75 | Fill #0

## 2019-12-10 ENCOUNTER — Other Ambulatory Visit: Payer: Self-pay | Admitting: Family Medicine

## 2019-12-10 DIAGNOSIS — G9050 Complex regional pain syndrome I, unspecified: Secondary | ICD-10-CM

## 2019-12-10 DIAGNOSIS — M549 Dorsalgia, unspecified: Secondary | ICD-10-CM

## 2019-12-10 MED FILL — HYDROCODON-APAP 7.5-325: 7.5-325 | 19 days supply | Qty: 75 | Fill #0

## 2019-12-10 MED FILL — ALPRAZolam 0.25 MG TABS: 0.25 | 15 days supply | Qty: 30 | Fill #0

## 2019-12-10 NOTE — Telephone Encounter (Signed)
Requesting:  Alprazolam and Hydrocodone Contract:  Signed on 11/17/2016 UDS:  Last done on 07/22/2018  Low risk Last Visit:    09/22/2019 Next Visit:   01/02/2020 Last Refill:   Alprazolam---#30 with 5 refills on-- 03/26/2019                     Hydrocodone----#75 with 0 refills on-- 10/28/2019 Please Advise

## 2019-12-11 ENCOUNTER — Ambulatory Visit: Payer: Medicare Other | Admitting: Family Medicine

## 2020-01-02 ENCOUNTER — Ambulatory Visit (INDEPENDENT_AMBULATORY_CARE_PROVIDER_SITE_OTHER): Payer: Medicare Other | Admitting: Family Medicine

## 2020-01-02 ENCOUNTER — Other Ambulatory Visit: Payer: Self-pay

## 2020-01-02 DIAGNOSIS — Z720 Tobacco use: Secondary | ICD-10-CM | POA: Diagnosis not present

## 2020-01-02 DIAGNOSIS — F418 Other specified anxiety disorders: Secondary | ICD-10-CM

## 2020-01-02 MED ORDER — BUPROPION HCL ER (SR) 150 MG PO TB12: ORAL_TABLET | ORAL | 5 refills | Status: DC

## 2020-01-02 MED FILL — BUPROPION HCL ER (SR) 150 M: 150 | 30 days supply | Qty: 60 | Fill #0

## 2020-01-04 NOTE — Assessment & Plan Note (Signed)
Patient initiated visit secondary to memory concerns but she acknowledges she is under tremendous stress at home with family concerns especially regarding her daughter. When this is increased her memory is worse. She had stopped the Wellbutrin so we will restart and refer to behavioral health for counseling. She notes anhedonia but denies suicidal ideation but she is aware and agrees to seek care at [facility] if she worsens. Spent 30 minutes in counseling patient.

## 2020-01-04 NOTE — Progress Notes (Signed)
Virtual Visit via Phone Note  I connected with Beverly Erickson on 01/02/20 at  9:20 AM EDT by a phone enabled telemedicine application and verified that I am speaking with the correct person using two identifiers.  Location: Patient: home Provider: home   I discussed the limitations of evaluation and management by telemedicine and the availability of in person appointments. The patient expressed understanding and agreed to proceed. Magdalene Molly, CMA was able to set the patient up with a phone visit after being unable to set up a video visit   Subjective:    Patient ID: Beverly Erickson, female    DOB: October 31, 1970, 49 y.o.   MRN: [id number]  No chief complaint on file.   HPI Patient is in today for evaluation of memory concerns. Patient initiated visit secondary to memory concerns but she acknowledges she is under tremendous stress at home with family concerns especially regarding her daughter. When this is increased her memory is worse. She had stopped the Wellbutrin. She denies any suicidal ideation but notes significant anhedonia. Denies CP/palp/SOB/HA/congestion/fevers/GI or GU c/o. Taking meds as prescribed  Past Medical History:  Diagnosis Date  . Anemia   . Chicken pox   . Depression   . Depression with anxiety 08/30/2015  . GERD (gastroesophageal reflux disease)   . Kidney stone 02/13/2014   Right, small  . Neck pain, musculoskeletal 11/17/2016  . Obesity   . Other and unspecified hyperlipidemia 01/14/2014  . Otitis media 11/17/2016  . Preventative health care 07/05/2014  . RSD (reflex sympathetic dystrophy)   . Tinea corporis 06/10/2017  . Tobacco use disorder 01/14/2014   1ppd  . Unspecified constipation 05/25/2014  . Urinary frequency 03/06/2017    Past Surgical History:  Procedure Laterality Date  . CESAREAN SECTION  2000  . plate and screws in left arm  2005   humerus  . WISDOM TOOTH EXTRACTION  50 yrs old    Family History  Problem Relation Age of Onset  . Arthritis Mother         Living  . Hypertension Mother   . Thrombocytopenia Mother   . Hyperlipidemia Father   . Hypertension Father   . Thrombocytopenia Maternal Grandmother   . Stroke Maternal Grandmother   . Hypertension Maternal Grandmother   . Alcohol abuse Maternal Grandfather   . Heart disease Maternal Grandfather   . Parkinson's disease Maternal Grandfather   . Alcohol abuse Paternal Grandfather   . Cancer Paternal Grandfather   . Hypothyroidism Daughter   . Diabetes Daughter        type 1  . Dementia Paternal Grandmother     Social History   Socioeconomic History  . Marital status: Widowed    Spouse name: Not on file  . Number of children: 3  . Years of education: Not on file  . Highest education level: Not on file  Occupational History  . Not on file  Tobacco Use  . Smoking status: Current Every Day Smoker    Types: Cigarettes  . Smokeless tobacco: Never Used  Substance and Sexual Activity  . Alcohol use: Yes    Comment: once a year.  . Drug use: No  . Sexual activity: Not on file    Comment: lives with son, 52 yo daughter, boyfriend, no dietary  Other Topics Concern  . Not on file  Social History Narrative  . Not on file   Social Determinants of Health   Financial Resource Strain:   . Difficulty of Paying Living Expenses:  Food Insecurity:   . Worried About Charity fundraiser in the Last Year:   . Arboriculturist in the Last Year:   Transportation Needs:   . Film/video editor (Medical):   Marland Kitchen Lack of Transportation (Non-Medical):   Physical Activity:   . Days of Exercise per Week:   . Minutes of Exercise per Session:   Stress:   . Feeling of Stress :   Social Connections:   . Frequency of Communication with Friends and Family:   . Frequency of Social Gatherings with Friends and Family:   . Attends Religious Services:   . Active Member of Clubs or Organizations:   . Attends Archivist Meetings:   Marland Kitchen Marital Status:   Intimate Partner Violence:    . Fear of Current or Ex-Partner:   . Emotionally Abused:   Marland Kitchen Physically Abused:   . Sexually Abused:     Outpatient Medications Prior to Visit  Medication Sig Dispense Refill  . albuterol (PROVENTIL HFA;VENTOLIN HFA) 108 (90 Base) MCG/ACT inhaler Inhale 2 puffs into the lungs every 6 (six) hours as needed for wheezing or shortness of breath. 2 Inhaler 2  . albuterol (PROVENTIL HFA;VENTOLIN HFA) 108 (90 Base) MCG/ACT inhaler Inhale 2 puffs into the lungs every 6 (six) hours as needed for wheezing or shortness of breath. 1 Inhaler 0  . ALPRAZolam (XANAX) 0.25 MG tablet TAKE 1 TABLET (0.25 MG TOTAL) BY MOUTH 2 (TWO) TIMES DAILY AS NEEDED FOR ANXIETY 30 tablet 5  . aspirin 81 MG tablet Take 81 mg by mouth daily.    . carisoprodol (SOMA) 350 MG tablet Take 1 tablet (350 mg total) by mouth 2 (two) times daily as needed for muscle spasms. 60 tablet 01  . cetirizine (ZYRTEC) 10 MG tablet Take 1 tablet (10 mg total) by mouth daily. 30 tablet 11  . fluticasone (FLONASE) 50 MCG/ACT nasal spray Place 2 sprays into both nostrils daily. 16 g 6  . HYDROcodone-acetaminophen (NORCO) 7.5-325 MG tablet TAKE 2 TABLETS BY MOUTH TWO TIMES DAILY AS NEEDED 75 tablet 0  . KRILL OIL PO Take 1 capsule by mouth daily.    . Misc. Devices (ROLLATOR) MISC Patient with severe pain and weakness in left leg Reflex Sympathetic Dystrophy 1 each 0  . nitroGLYCERIN (NITROSTAT) 0.4 MG SL tablet Place 1 tablet (0.4 mg total) under the tongue every 5 (five) minutes as needed for chest pain. 25 tablet 1  . omeprazole (PRILOSEC) 40 MG capsule Take 1 capsule (40 mg total) by mouth 2 (two) times daily. 180 capsule 1  . Potassium 75 MG TABS Take 1 each by mouth at bedtime.    . pramipexole (MIRAPEX) 1 MG tablet Take 1 tablet (1 mg total) by mouth at bedtime. 30 tablet 2  . buPROPion (WELLBUTRIN SR) 150 MG 12 hr tablet one tablet by mouth twice daily 60 tablet 2  . ranitidine (ZANTAC) 300 MG tablet Take 1 tablet (300 mg total) by mouth  daily as needed for heartburn. 90 tablet 1   No facility-administered medications prior to visit.    Allergies  Allergen Reactions  . Codeine Nausea Only  . Mucinex [Guaifenesin Er] Other (See Comments)    Nightmares    Review of Systems  Constitutional: Positive for malaise/fatigue. Negative for fever.  HENT: Negative for congestion.   Eyes: Negative for blurred vision.  Respiratory: Negative for shortness of breath.   Cardiovascular: Negative for chest pain, palpitations and leg swelling.  Gastrointestinal: Negative  for abdominal pain, blood in stool and nausea.  Genitourinary: Negative for dysuria and frequency.  Musculoskeletal: Positive for back pain, joint pain and myalgias. Negative for falls.  Skin: Negative for rash.  Neurological: Negative for dizziness, loss of consciousness and headaches.  Endo/Heme/Allergies: Negative for environmental allergies.  Psychiatric/Behavioral: Positive for depression and memory loss. Negative for hallucinations, substance abuse and suicidal ideas. The patient is nervous/anxious.        Objective:    Physical Exam Unable to obtain via phone visit  There were no vitals taken for this visit. Wt Readings from Last 3 Encounters:  05/19/19 200 lb 6.4 oz (90.9 kg)  09/06/18 243 lb (110.2 kg)  07/22/18 251 lb (113.9 kg)    Diabetic Foot Exam - Simple   No data filed     Lab Results  Component Value Date   WBC 6.1 05/19/2019   HGB 14.1 05/19/2019   HCT 41.8 05/19/2019   PLT 225.0 05/19/2019   GLUCOSE 72 05/19/2019   CHOL 154 05/19/2019   TRIG 52.0 05/19/2019   HDL 43.00 05/19/2019   LDLCALC 101 (H) 05/19/2019   ALT 10 05/19/2019   AST 11 05/19/2019   NA 142 05/19/2019   K 4.5 05/19/2019   CL 110 05/19/2019   CREATININE 0.74 05/19/2019   BUN 22 05/19/2019   CO2 24 05/19/2019   TSH 1.08 05/19/2019   HGBA1C 5.3 05/19/2019    Lab Results  Component Value Date   TSH 1.08 05/19/2019   Lab Results  Component Value Date    WBC 6.1 05/19/2019   HGB 14.1 05/19/2019   HCT 41.8 05/19/2019   MCV 92.2 05/19/2019   PLT 225.0 05/19/2019   Lab Results  Component Value Date   NA 142 05/19/2019   K 4.5 05/19/2019   CO2 24 05/19/2019   GLUCOSE 72 05/19/2019   BUN 22 05/19/2019   CREATININE 0.74 05/19/2019   BILITOT 0.4 05/19/2019   ALKPHOS 59 05/19/2019   AST 11 05/19/2019   ALT 10 05/19/2019   PROT 6.6 05/19/2019   ALBUMIN 4.2 05/19/2019   CALCIUM 9.4 05/19/2019   GFR 84.05 05/19/2019   Lab Results  Component Value Date   CHOL 154 05/19/2019   Lab Results  Component Value Date   HDL 43.00 05/19/2019   Lab Results  Component Value Date   LDLCALC 101 (H) 05/19/2019   Lab Results  Component Value Date   TRIG 52.0 05/19/2019   Lab Results  Component Value Date   CHOLHDL 4 05/19/2019   Lab Results  Component Value Date   HGBA1C 5.3 05/19/2019       Assessment & Plan:   Problem List Items Addressed This Visit    Depression with anxiety - Primary    Patient initiated visit secondary to memory concerns but she acknowledges she is under tremendous stress at home with family concerns especially regarding her daughter. When this is increased her memory is worse. She had stopped the Wellbutrin so we will restart and refer to behavioral health for counseling. She notes anhedonia but denies suicidal ideation but she is aware and agrees to seek care at [facility] if she worsens. Spent 30 minutes in counseling patient.       Relevant Medications   buPROPion (WELLBUTRIN SR) 150 MG 12 hr tablet   Other Relevant Orders   Ambulatory referral to [facility]    Other Visit Diagnoses    Tobacco abuse disorder       Relevant Medications  buPROPion (WELLBUTRIN SR) 150 MG 12 hr tablet      I have discontinued Ivin Booty Nouri's ranitidine. I am also having her maintain her Potassium, aspirin, KRILL OIL PO, Rollator, pramipexole, carisoprodol, omeprazole, nitroGLYCERIN, albuterol, fluticasone, cetirizine,  albuterol, HYDROcodone-acetaminophen, ALPRAZolam, and buPROPion.  Meds ordered this encounter  Medications  . buPROPion (WELLBUTRIN SR) 150 MG 12 hr tablet    Sig: one tablet by mouth twice daily    Dispense:  60 tablet    Refill:  5     I discussed the assessment and treatment plan with the patient. The patient was provided an opportunity to ask questions and all were answered. The patient agreed with the plan and demonstrated an understanding of the instructions.   The patient was advised to call back or seek an in-person evaluation if the symptoms worsen or if the condition fails to improve as anticipated.  I provided 30 minutes of non-face-to-face time during this encounter.   Penni Homans, MD

## 2020-01-21 MED FILL — HYDROCODON-APAP 7.5-325: 7.5-325 | 19 days supply | Qty: 75 | Fill #0

## 2020-02-20 ENCOUNTER — Other Ambulatory Visit: Payer: Self-pay | Admitting: Family Medicine

## 2020-02-20 DIAGNOSIS — M549 Dorsalgia, unspecified: Secondary | ICD-10-CM

## 2020-02-20 DIAGNOSIS — G9050 Complex regional pain syndrome I, unspecified: Secondary | ICD-10-CM

## 2020-02-20 MED FILL — ALPRAZolam 0.25 MG TABS: 0.25 | 15 days supply | Qty: 30 | Fill #0

## 2020-02-20 MED FILL — BUPROPION HCL ER (SR) 150 M: 150 | 30 days supply | Qty: 60 | Fill #0

## 2020-02-20 NOTE — Telephone Encounter (Signed)
Requesting: norco Contract: n/a UDS:07/22/18 Last Visit:01/02/20 Next Visit:n/a Last Refill:12/10/19  Please Advise

## 2020-03-24 ENCOUNTER — Other Ambulatory Visit: Payer: Self-pay | Admitting: Family Medicine

## 2020-03-24 DIAGNOSIS — G9050 Complex regional pain syndrome I, unspecified: Secondary | ICD-10-CM

## 2020-03-24 DIAGNOSIS — M549 Dorsalgia, unspecified: Secondary | ICD-10-CM

## 2020-03-25 NOTE — Telephone Encounter (Signed)
Last written: 02/22/20 Last ov: 01/02/20 Next ov: none Contract:  UDS: 07/22/18

## 2020-04-23 ENCOUNTER — Other Ambulatory Visit: Payer: Self-pay | Admitting: Family Medicine

## 2020-04-23 DIAGNOSIS — M549 Dorsalgia, unspecified: Secondary | ICD-10-CM

## 2020-04-23 DIAGNOSIS — G9050 Complex regional pain syndrome I, unspecified: Secondary | ICD-10-CM

## 2020-04-26 MED FILL — HYDROCODON-APAP 7.5-325: 7.5-325 | 19 days supply | Qty: 75 | Fill #0

## 2020-04-26 NOTE — Telephone Encounter (Signed)
Last written: 03/25/20 Last ov: 01/02/20 Next ov: none Contract:  UDS: 07/22/18

## 2020-05-25 ENCOUNTER — Other Ambulatory Visit: Payer: Self-pay | Admitting: Family Medicine

## 2020-05-25 DIAGNOSIS — G9050 Complex regional pain syndrome I, unspecified: Secondary | ICD-10-CM

## 2020-05-25 DIAGNOSIS — M549 Dorsalgia, unspecified: Secondary | ICD-10-CM

## 2020-05-25 NOTE — Telephone Encounter (Signed)
Requesting: NORCO Contract:n/a UDS:07/22/18 Last Visit:01/02/20 Next Visit:n/a Last Refill:04/26/20  Please Advise

## 2020-05-26 MED FILL — HYDROCODON-APAP 7.5-325: 7.5-325 | 19 days supply | Qty: 75 | Fill #0

## 2020-06-24 ENCOUNTER — Other Ambulatory Visit: Payer: Self-pay | Admitting: Family Medicine

## 2020-06-24 DIAGNOSIS — G9050 Complex regional pain syndrome I, unspecified: Secondary | ICD-10-CM

## 2020-06-24 DIAGNOSIS — M549 Dorsalgia, unspecified: Secondary | ICD-10-CM

## 2020-06-24 NOTE — Telephone Encounter (Signed)
She has not been seen in over a year. I will send in #20 tabs but she will need an appt for anymore.

## 2020-06-24 NOTE — Telephone Encounter (Signed)
Last written:05/25/20 Last ov:05/19/19 Next IR:XJJY Contract: UDS:07/22/18

## 2020-06-25 MED FILL — HYDROCODON-APAP 7.5-325: 7.5-325 | 10 days supply | Qty: 20 | Fill #0

## 2020-06-28 NOTE — Telephone Encounter (Signed)
No answer/vm full

## 2020-07-01 ENCOUNTER — Ambulatory Visit (INDEPENDENT_AMBULATORY_CARE_PROVIDER_SITE_OTHER): Payer: Medicare Other

## 2020-07-01 ENCOUNTER — Other Ambulatory Visit: Payer: Self-pay

## 2020-07-01 ENCOUNTER — Telehealth (INDEPENDENT_AMBULATORY_CARE_PROVIDER_SITE_OTHER): Payer: Medicare Other | Admitting: Family Medicine

## 2020-07-01 ENCOUNTER — Encounter: Payer: Self-pay | Admitting: Family Medicine

## 2020-07-01 VITALS — BP 124/72 | HR 83 | Temp 98.7°F

## 2020-07-01 DIAGNOSIS — R509 Fever, unspecified: Secondary | ICD-10-CM | POA: Diagnosis not present

## 2020-07-01 DIAGNOSIS — R0989 Other specified symptoms and signs involving the circulatory and respiratory systems: Secondary | ICD-10-CM

## 2020-07-01 LAB — POC INFLUENZA A&B (BINAX/QUICKVUE)
Influenza A, POC: NEGATIVE
Influenza B, POC: NEGATIVE

## 2020-07-01 MED ORDER — ALBUTEROL SULFATE HFA 108 (90 BASE) MCG/ACT IN AERS: 2.0000 | INHALATION_SPRAY | Freq: Four times a day (QID) | RESPIRATORY_TRACT | 2 refills | Status: DC | PRN

## 2020-07-01 NOTE — Progress Notes (Signed)
Patient seen today for flu swab due to symptoms ordered by Dr. Janett Billow Copland.

## 2020-07-01 NOTE — Progress Notes (Signed)
[facility] [address], [place], [place] [address] [phone]  Date:  07/01/2020   Name:  Beverly Erickson   DOB:  1970/11/14   MRN:  [id number]  PCP:  Mosie Lukes, MD    Chief Complaint: No chief complaint on file.   History of Present Illness:  Beverly Erickson is a 49 y.o. very pleasant female patient who presents with the following:  Patient of my partner Dr. Charlett Blake, virtual visit today for acute illness-patient also was seen in person in the parking lot I have not seen this patient myself in the past History of TIA, obesity, hyperlipidemia, hyperglycemia, elevated blood pressure, depression  Connected with patient via video monitor.  Patient location is her car, my location is office Patient identity confirmed 2 factors, she gives consent for virtual visit today  Pt notes nasal and sinus congestion, headache, body aches- sx started 4 days ago This am she felt nauseated She has noted chills, temp to 99.5 or so No vomiting or diarrhea She is coughing At nighttime she notes worsening of chest congestion  She has a sick person in her home  Pt did not have covid vaccine   Patient Active Problem List   Diagnosis Date Noted  . SOB (shortness of breath) 07/28/2018  . Fatigue 01/22/2018  . Tinea corporis 06/10/2017  . Muscle cramp 03/07/2017  . Acute bilateral low back pain 03/07/2017  . Urinary frequency 03/06/2017  . Neck pain, musculoskeletal 11/17/2016  . Otitis media 11/17/2016  . Breast cancer screening 08/13/2016  . Abdominal pain, chronic, epigastric 08/13/2016  . Witnessed episode of apnea 08/13/2016  . Acute bacterial sinusitis 10/20/2015  . Depression with anxiety 08/30/2015  . Elevated blood pressure 08/11/2015  . Atypical chest pain 05/23/2015  . Preventative health care 07/05/2014  . Constipation 05/25/2014  . Lesion of tonsil 05/03/2014  . Transient ischemic attack (TIA) 05/03/2014  . Kidney  stone 02/13/2014  . Mid back pain on right side 02/13/2014  . Hyperglycemia 01/14/2014  . Hyperlipidemia, mixed 01/14/2014  . Obesity   . RSD (reflex sympathetic dystrophy)   . GERD (gastroesophageal reflux disease) 12/05/2013  . Complex regional pain syndrome I of lower limb 02/07/2013    Past Medical History:  Diagnosis Date  . Anemia   . Chicken pox   . Depression   . Depression with anxiety 08/30/2015  . GERD (gastroesophageal reflux disease)   . Kidney stone 02/13/2014   Right, small  . Neck pain, musculoskeletal 11/17/2016  . Obesity   . Other and unspecified hyperlipidemia 01/14/2014  . Otitis media 11/17/2016  . Preventative health care 07/05/2014  . RSD (reflex sympathetic dystrophy)   . Tinea corporis 06/10/2017  . Tobacco use disorder 01/14/2014   1ppd  . Unspecified constipation 05/25/2014  . Urinary frequency 03/06/2017    Past Surgical History:  Procedure Laterality Date  . CESAREAN SECTION  2000  . plate and screws in left arm  2005   humerus  . WISDOM TOOTH EXTRACTION  49 yrs old    Social History   Tobacco Use  . Smoking status: Current Every Day Smoker    Types: Cigarettes  . Smokeless tobacco: Never Used  Substance Use Topics  . Alcohol use: Yes    Comment: once a year.  . Drug use: No    Family History  Problem Relation Age of Onset  . Arthritis Mother  Living  . Hypertension Mother   . Thrombocytopenia Mother   . Hyperlipidemia Father   . Hypertension Father   . Thrombocytopenia Maternal Grandmother   . Stroke Maternal Grandmother   . Hypertension Maternal Grandmother   . Alcohol abuse Maternal Grandfather   . Heart disease Maternal Grandfather   . Parkinson's disease Maternal Grandfather   . Alcohol abuse Paternal Grandfather   . Cancer Paternal Grandfather   . Hypothyroidism Daughter   . Diabetes Daughter        type 1  . Dementia Paternal Grandmother     Allergies  Allergen Reactions  . Codeine Nausea Only  . Mucinex  [Guaifenesin Er] Other (See Comments)    Nightmares    Medication list has been reviewed and updated.  Current Outpatient Medications on File Prior to Visit  Medication Sig Dispense Refill  . albuterol (PROVENTIL HFA;VENTOLIN HFA) 108 (90 Base) MCG/ACT inhaler Inhale 2 puffs into the lungs every 6 (six) hours as needed for wheezing or shortness of breath. 2 Inhaler 2  . ALPRAZolam (XANAX) 0.25 MG tablet TAKE 1 TABLET (0.25 MG TOTAL) BY MOUTH 2 (TWO) TIMES DAILY AS NEEDED FOR ANXIETY 30 tablet 5  . aspirin 81 MG tablet Take 81 mg by mouth daily.    Marland Kitchen buPROPion (WELLBUTRIN SR) 150 MG 12 hr tablet one tablet by mouth twice daily 60 tablet 5  . carisoprodol (SOMA) 350 MG tablet Take 1 tablet (350 mg total) by mouth 2 (two) times daily as needed for muscle spasms. 60 tablet 01  . cetirizine (ZYRTEC) 10 MG tablet Take 1 tablet (10 mg total) by mouth daily. 30 tablet 11  . fluticasone (FLONASE) 50 MCG/ACT nasal spray Place 2 sprays into both nostrils daily. 16 g 6  . HYDROcodone-acetaminophen (NORCO) 7.5-325 MG tablet TAKE 2 TABLETS BY MOUTH TWO TIMES DAILY AS NEEDED 20 tablet 0  . KRILL OIL PO Take 1 capsule by mouth daily.    . Misc. Devices (ROLLATOR) MISC Patient with severe pain and weakness in left leg Reflex Sympathetic Dystrophy 1 each 0  . nitroGLYCERIN (NITROSTAT) 0.4 MG SL tablet Place 1 tablet (0.4 mg total) under the tongue every 5 (five) minutes as needed for chest pain. 25 tablet 1  . omeprazole (PRILOSEC) 40 MG capsule Take 1 capsule (40 mg total) by mouth 2 (two) times daily. 180 capsule 1  . Potassium 75 MG TABS Take 1 each by mouth at bedtime.    . pramipexole (MIRAPEX) 1 MG tablet Take 1 tablet (1 mg total) by mouth at bedtime. 30 tablet 2   No current facility-administered medications on file prior to visit.    Review of Systems:  As per HPI- otherwise negative.   Physical Examination: Vitals:   07/01/20 1557  BP: 124/72  Pulse: 83  Temp: 98.7 F (37.1 C)  SpO2:  98%   There were no vitals filed for this visit. There is no height or weight on file to calculate BMI. Ideal Body Weight:    GEN: no acute distress.  Obese, does not appear distressed.  Some cough HEENT: Atraumatic, Normocephalic.  Ears and Nose: No external deformity. CV: RRR, No M/G/R. No JVD. No thrill. No extra heart sounds. PULM: CTA B, no wheezes, crackles, rhonchi. No retractions. No resp. distress. No accessory muscle use. Lungs sound clear at this time EXTR: No c/c/e PSYCH: Normally interactive. Conversant.    Assessment and Plan: Low grade fever - Plan: Novel Coronavirus, NAA ([facility]), CANCELED: POCT Influenza A/B  Chest congestion - Plan: albuterol (VENTOLIN HFA) 108 (90 Base) MCG/ACT inhaler      Patient seen today with concern of fever, chest congestion, cough.  Symptoms possibly indicative of influenza or COVID-19.  Rapid flu negative, I have sent off a Covid swab for her Advised patient that we will be in touch with her ASAP pending her COVID-19 test.  However, given her symptoms and lack of immunization she is high risk for Covid.  I advised her to self isolate until results return.  If she is in distress, or if not doing okay she is advised to go to the emergency department  I refilled inhaler to use as needed for chest congestion and wheezing  This visit occurred during the SARS-CoV-2 public health emergency.  Safety protocols were in place, including screening questions prior to the visit, additional usage of staff PPE, and extensive cleaning of exam room while observing appropriate contact time as indicated for disinfecting solutions.     Signed Lamar Blinks, MD

## 2020-07-02 DIAGNOSIS — R509 Fever, unspecified: Secondary | ICD-10-CM | POA: Diagnosis not present

## 2020-07-02 LAB — NOVEL CORONAVIRUS, NAA: SARS-CoV-2, NAA: NEGATIVE

## 2020-07-05 ENCOUNTER — Telehealth: Payer: Self-pay | Admitting: Family Medicine

## 2020-07-05 ENCOUNTER — Telehealth: Payer: Self-pay

## 2020-07-05 DIAGNOSIS — J440 Chronic obstructive pulmonary disease with acute lower respiratory infection: Secondary | ICD-10-CM

## 2020-07-05 MED ORDER — BENZONATATE 100 MG PO CAPS: 100.0000 mg | ORAL_CAPSULE | Freq: Two times a day (BID) | ORAL | 0 refills | Status: DC | PRN

## 2020-07-05 MED ORDER — DOXYCYCLINE HYCLATE 100 MG PO CAPS: 100.0000 mg | ORAL_CAPSULE | Freq: Two times a day (BID) | ORAL | 0 refills | Status: DC

## 2020-07-05 NOTE — Telephone Encounter (Signed)
Patient was seen on 9/16 and had Covid Testing done.  Would you mind finding out if it was sent out late and the possible turnaround time for results please?  Thank you kindly.

## 2020-07-05 NOTE — Telephone Encounter (Signed)
Received fax of SARS Cov2 result and placed it on your desk.

## 2020-07-05 NOTE — Telephone Encounter (Signed)
Patient notified of negative-covid testing, and negative-Flu-A/B.  Patient experinecing the following symptoms: Coughing at night and during the day, congestion early morning hours/nighttime, headaches, no fever right now, weakness, nausea-like symptoms, some chest discomfort, with popping of the ears.  Patient would more can be done?

## 2020-07-05 NOTE — Telephone Encounter (Signed)
Patient notified and have forwarded possible lingering-respiratory symptoms with no fever, information to provider.

## 2020-07-05 NOTE — Telephone Encounter (Signed)
TY

## 2020-07-05 NOTE — Telephone Encounter (Signed)
Per Silva Bandy at [facility], test has just resulted. It should come through EMR via [facility] interface but I have also requested a copy of the result be faxed to [place] as well.

## 2020-07-05 NOTE — Addendum Note (Signed)
Addended by: Lamar Blinks C on: 07/05/2020 04:52 PM   Modules accepted: Orders

## 2020-07-05 NOTE — Telephone Encounter (Signed)
Called pt back- she is bothered by cough at night, cough, nausea, sinus pressure and pain  No vomiting No further fever She has been sick for about 10 days now  No chance of pregnancy per her report   Will call in doxy and tessalon perles.  Asked her to let me know if not feeling better in the next few days and she agrees

## 2020-07-05 NOTE — Telephone Encounter (Signed)
Caller Sarissa Dern  Call Back # [phone]  Patient states she would like results for Covid and Flu testing.

## 2020-07-08 ENCOUNTER — Encounter: Payer: Self-pay | Admitting: Family Medicine

## 2020-07-13 ENCOUNTER — Telehealth (INDEPENDENT_AMBULATORY_CARE_PROVIDER_SITE_OTHER): Payer: Medicare Other | Admitting: Family Medicine

## 2020-07-13 ENCOUNTER — Other Ambulatory Visit: Payer: Self-pay

## 2020-07-13 DIAGNOSIS — G9050 Complex regional pain syndrome I, unspecified: Secondary | ICD-10-CM | POA: Diagnosis not present

## 2020-07-13 DIAGNOSIS — F418 Other specified anxiety disorders: Secondary | ICD-10-CM

## 2020-07-13 DIAGNOSIS — G90522 Complex regional pain syndrome I of left lower limb: Secondary | ICD-10-CM

## 2020-07-13 DIAGNOSIS — E782 Mixed hyperlipidemia: Secondary | ICD-10-CM

## 2020-07-13 DIAGNOSIS — T7840XD Allergy, unspecified, subsequent encounter: Secondary | ICD-10-CM | POA: Diagnosis not present

## 2020-07-13 DIAGNOSIS — M549 Dorsalgia, unspecified: Secondary | ICD-10-CM

## 2020-07-13 DIAGNOSIS — R739 Hyperglycemia, unspecified: Secondary | ICD-10-CM | POA: Diagnosis not present

## 2020-07-13 MED ORDER — BUPROPION HCL 100 MG PO TABS: 100.0000 mg | ORAL_TABLET | Freq: Three times a day (TID) | ORAL | 2 refills | Status: DC

## 2020-07-13 MED ORDER — HYDROCODONE-ACETAMINOPHEN 7.5-325 MG PO TABS: ORAL_TABLET | ORAL | 0 refills | Status: DC

## 2020-07-13 MED FILL — buPROPion HCL 100 MG TABS: 100 | 30 days supply | Qty: 90 | Fill #0

## 2020-07-14 ENCOUNTER — Telehealth: Payer: Self-pay | Admitting: Family Medicine

## 2020-07-14 ENCOUNTER — Encounter: Payer: Self-pay | Admitting: Family Medicine

## 2020-07-14 ENCOUNTER — Other Ambulatory Visit: Payer: Self-pay | Admitting: Family Medicine

## 2020-07-14 DIAGNOSIS — T7840XA Allergy, unspecified, initial encounter: Secondary | ICD-10-CM | POA: Insufficient documentation

## 2020-07-14 DIAGNOSIS — M549 Dorsalgia, unspecified: Secondary | ICD-10-CM

## 2020-07-14 DIAGNOSIS — G9050 Complex regional pain syndrome I, unspecified: Secondary | ICD-10-CM

## 2020-07-14 MED ORDER — HYDROCODONE-ACETAMINOPHEN 7.5-325 MG PO TABS: ORAL_TABLET | ORAL | 0 refills | Status: DC

## 2020-07-14 NOTE — Telephone Encounter (Signed)
Please advise 

## 2020-07-14 NOTE — Assessment & Plan Note (Addendum)
Has been struggling recently due to her daughter being incarcerated and not having a great deal of contact with her grandchildren as a result. She was unable to afford her Wellbutrin so has not taken for over a month. Will try restarting her on Wellbutrin 100 mg tabs 1 tid and reassess at next visit. Call if any concerns.

## 2020-07-14 NOTE — Assessment & Plan Note (Signed)
Encouraged heart healthy diet, increase exercise, avoid trans fats, consider a krill oil cap daily 

## 2020-07-14 NOTE — Telephone Encounter (Signed)
Patient states she had a virtual visit yesterday for her meds. She only got a 20 tablet prescription. Patient would like a full month.  HYDROcodone-acetaminophen (NORCO) 7.5-325 MG tablet [[id number]]

## 2020-07-14 NOTE — Telephone Encounter (Signed)
Not sure how it changed but I have sent in her regular prescription she should just wait a few days to pick it up.

## 2020-07-14 NOTE — Assessment & Plan Note (Signed)
Continues to struggle with pain daily but tries to stay active and minimize medications use.

## 2020-07-14 NOTE — Assessment & Plan Note (Signed)
minimize simple carbs. Increase exercise as tolerated.  

## 2020-07-14 NOTE — Progress Notes (Addendum)
Virtual Visit via phone Note  I connected with Beverly Erickson on 07/13/20 at  9:00 AM EDT by a phone enabled telemedicine application and verified that I am speaking with the correct person using two identifiers.  Location: Patient: home, patient and provider in visit Provider: office   I discussed the limitations of evaluation and management by telemedicine and the availability of in person appointments. The patient expressed understanding and agreed to proceed. Nani Skillern, CMA was able to get the patient set up on a phone visit after being unable to set up a video visit.     Subjective:    Patient ID: Beverly Erickson, female    DOB: 06-18-1971, 49 y.o.   MRN: [id number]  Chief Complaint  Patient presents with  . Follow-up    medication concerns    HPI Patient is in today for follow up on chronic medical concerns. No recent febrile illness or hospitalizations. Has been struggling recently due to her daughter being incarcerated and not having a great deal of contact with her grandchildren as a result. She was unable to afford her Wellbutrin so has not taken for over a month. No complaints of polyuria or polydipsia. Denies CP/palp/SOB/HA/congestion/fevers/GI or GU c/o. Taking meds as prescribed except for the wellbutrin as noted  Past Medical History:  Diagnosis Date  . Anemia   . Chicken pox   . Depression   . Depression with anxiety 08/30/2015  . GERD (gastroesophageal reflux disease)   . Kidney stone 02/13/2014   Right, small  . Neck pain, musculoskeletal 11/17/2016  . Obesity   . Other and unspecified hyperlipidemia 01/14/2014  . Otitis media 11/17/2016  . Preventative health care 07/05/2014  . RSD (reflex sympathetic dystrophy)   . Tinea corporis 06/10/2017  . Tobacco use disorder 01/14/2014   1ppd  . Unspecified constipation 05/25/2014  . Urinary frequency 03/06/2017    Past Surgical History:  Procedure Laterality Date  . CESAREAN SECTION  2000  . plate and screws in left arm  2005    humerus  . WISDOM TOOTH EXTRACTION  49 yrs old    Family History  Problem Relation Age of Onset  . Arthritis Mother        Living  . Hypertension Mother   . Thrombocytopenia Mother   . Hyperlipidemia Father   . Hypertension Father   . Thrombocytopenia Maternal Grandmother   . Stroke Maternal Grandmother   . Hypertension Maternal Grandmother   . Alcohol abuse Maternal Grandfather   . Heart disease Maternal Grandfather   . Parkinson's disease Maternal Grandfather   . Alcohol abuse Paternal Grandfather   . Cancer Paternal Grandfather   . Hypothyroidism Daughter   . Diabetes Daughter        type 1  . Dementia Paternal Grandmother     Social History   Socioeconomic History  . Marital status: Widowed    Spouse name: Not on file  . Number of children: 3  . Years of education: Not on file  . Highest education level: Not on file  Occupational History  . Not on file  Tobacco Use  . Smoking status: Current Every Day Smoker    Types: Cigarettes  . Smokeless tobacco: Never Used  Substance and Sexual Activity  . Alcohol use: Yes    Comment: once a year.  . Drug use: No  . Sexual activity: Not on file    Comment: lives with son, 2 yo daughter, boyfriend, no dietary  Other Topics Concern  .  Not on file  Social History Narrative  . Not on file   Social Determinants of Health   Financial Resource Strain:   . Difficulty of Paying Living Expenses: Not on file  Food Insecurity:   . Worried About Charity fundraiser in the Last Year: Not on file  . Ran Out of Food in the Last Year: Not on file  Transportation Needs:   . Lack of Transportation (Medical): Not on file  . Lack of Transportation (Non-Medical): Not on file  Physical Activity:   . Days of Exercise per Week: Not on file  . Minutes of Exercise per Session: Not on file  Stress:   . Feeling of Stress : Not on file  Social Connections:   . Frequency of Communication with Friends and Family: Not on file  . Frequency  of Social Gatherings with Friends and Family: Not on file  . Attends Religious Services: Not on file  . Active Member of Clubs or Organizations: Not on file  . Attends Archivist Meetings: Not on file  . Marital Status: Not on file  Intimate Partner Violence:   . Fear of Current or Ex-Partner: Not on file  . Emotionally Abused: Not on file  . Physically Abused: Not on file  . Sexually Abused: Not on file    Outpatient Medications Prior to Visit  Medication Sig Dispense Refill  . albuterol (PROVENTIL HFA;VENTOLIN HFA) 108 (90 Base) MCG/ACT inhaler Inhale 2 puffs into the lungs every 6 (six) hours as needed for wheezing or shortness of breath. 2 Inhaler 2  . albuterol (VENTOLIN HFA) 108 (90 Base) MCG/ACT inhaler Inhale 2 puffs into the lungs every 6 (six) hours as needed for wheezing or shortness of breath. 18 g 2  . ALPRAZolam (XANAX) 0.25 MG tablet TAKE 1 TABLET (0.25 MG TOTAL) BY MOUTH 2 (TWO) TIMES DAILY AS NEEDED FOR ANXIETY 30 tablet 5  . aspirin 81 MG tablet Take 81 mg by mouth daily.    . benzonatate (TESSALON) 100 MG capsule Take 1 capsule (100 mg total) by mouth 2 (two) times daily as needed for cough. 20 capsule 0  . carisoprodol (SOMA) 350 MG tablet Take 1 tablet (350 mg total) by mouth 2 (two) times daily as needed for muscle spasms. 60 tablet 01  . cetirizine (ZYRTEC) 10 MG tablet Take 1 tablet (10 mg total) by mouth daily. 30 tablet 11  . doxycycline (VIBRAMYCIN) 100 MG capsule Take 1 capsule (100 mg total) by mouth 2 (two) times daily. 20 capsule 0  . fluticasone (FLONASE) 50 MCG/ACT nasal spray Place 2 sprays into both nostrils daily. 16 g 6  . KRILL OIL PO Take 1 capsule by mouth daily.    . Misc. Devices (ROLLATOR) MISC Patient with severe pain and weakness in left leg Reflex Sympathetic Dystrophy 1 each 0  . nitroGLYCERIN (NITROSTAT) 0.4 MG SL tablet Place 1 tablet (0.4 mg total) under the tongue every 5 (five) minutes as needed for chest pain. 25 tablet 1  .  omeprazole (PRILOSEC) 40 MG capsule Take 1 capsule (40 mg total) by mouth 2 (two) times daily. 180 capsule 1  . Potassium 75 MG TABS Take 1 each by mouth at bedtime.    . pramipexole (MIRAPEX) 1 MG tablet Take 1 tablet (1 mg total) by mouth at bedtime. 30 tablet 2  . buPROPion (WELLBUTRIN SR) 150 MG 12 hr tablet one tablet by mouth twice daily 60 tablet 5  . HYDROcodone-acetaminophen (NORCO) 7.5-325 MG  tablet TAKE 2 TABLETS BY MOUTH TWO TIMES DAILY AS NEEDED 20 tablet 0   No facility-administered medications prior to visit.    Allergies  Allergen Reactions  . Codeine Nausea Only  . Mucinex [Guaifenesin Er] Other (See Comments)    Nightmares    Review of Systems  Constitutional: Positive for malaise/fatigue. Negative for fever.  HENT: Positive for congestion.   Eyes: Negative for blurred vision.  Respiratory: Negative for shortness of breath.   Cardiovascular: Negative for chest pain, palpitations and leg swelling.  Gastrointestinal: Negative for abdominal pain, blood in stool and nausea.  Genitourinary: Negative for dysuria and frequency.  Musculoskeletal: Positive for back pain, joint pain and myalgias. Negative for falls.  Skin: Negative for rash.  Neurological: Negative for dizziness, loss of consciousness and headaches.  Endo/Heme/Allergies: Negative for environmental allergies.  Psychiatric/Behavioral: Positive for depression. The patient is nervous/anxious.        Objective:    Physical Exam unable to obtain via phone  Ht 5\' 7"  (1.702 m)   BMI 31.39 kg/m  Wt Readings from Last 3 Encounters:  05/19/19 200 lb 6.4 oz (90.9 kg)  09/06/18 243 lb (110.2 kg)  07/22/18 251 lb (113.9 kg)    Diabetic Foot Exam - Simple   No data filed     Lab Results  Component Value Date   WBC 6.1 05/19/2019   HGB 14.1 05/19/2019   HCT 41.8 05/19/2019   PLT 225.0 05/19/2019   GLUCOSE 72 05/19/2019   CHOL 154 05/19/2019   TRIG 52.0 05/19/2019   HDL 43.00 05/19/2019   LDLCALC 101  (H) 05/19/2019   ALT 10 05/19/2019   AST 11 05/19/2019   NA 142 05/19/2019   K 4.5 05/19/2019   CL 110 05/19/2019   CREATININE 0.74 05/19/2019   BUN 22 05/19/2019   CO2 24 05/19/2019   TSH 1.08 05/19/2019   HGBA1C 5.3 05/19/2019    Lab Results  Component Value Date   TSH 1.08 05/19/2019   Lab Results  Component Value Date   WBC 6.1 05/19/2019   HGB 14.1 05/19/2019   HCT 41.8 05/19/2019   MCV 92.2 05/19/2019   PLT 225.0 05/19/2019   Lab Results  Component Value Date   NA 142 05/19/2019   K 4.5 05/19/2019   CO2 24 05/19/2019   GLUCOSE 72 05/19/2019   BUN 22 05/19/2019   CREATININE 0.74 05/19/2019   BILITOT 0.4 05/19/2019   ALKPHOS 59 05/19/2019   AST 11 05/19/2019   ALT 10 05/19/2019   PROT 6.6 05/19/2019   ALBUMIN 4.2 05/19/2019   CALCIUM 9.4 05/19/2019   GFR 84.05 05/19/2019   Lab Results  Component Value Date   CHOL 154 05/19/2019   Lab Results  Component Value Date   HDL 43.00 05/19/2019   Lab Results  Component Value Date   LDLCALC 101 (H) 05/19/2019   Lab Results  Component Value Date   TRIG 52.0 05/19/2019   Lab Results  Component Value Date   CHOLHDL 4 05/19/2019   Lab Results  Component Value Date   HGBA1C 5.3 05/19/2019       Assessment & Plan:   Problem List Items Addressed This Visit    Complex regional pain syndrome I of lower limb    Continues to struggle with pain daily but tries to stay active and minimize medications use.       Relevant Medications   buPROPion (WELLBUTRIN) 100 MG tablet   HYDROcodone-acetaminophen (NORCO) 7.5-325 MG tablet  RSD (reflex sympathetic dystrophy)   Relevant Medications   buPROPion (WELLBUTRIN) 100 MG tablet   HYDROcodone-acetaminophen (NORCO) 7.5-325 MG tablet   Hyperglycemia     minimize simple carbs. Increase exercise as tolerated.       Hyperlipidemia, mixed    Encouraged heart healthy diet, increase exercise, avoid trans fats, consider a krill oil cap daily      Mid back pain on  right side   Relevant Medications   HYDROcodone-acetaminophen (NORCO) 7.5-325 MG tablet   Depression with anxiety    Has been struggling recently due to her daughter being incarcerated and not having a great deal of contact with her grandchildren as a result. She was unable to afford her Wellbutrin so has not taken for over a month. Will try restarting her on Wellbutrin 100 mg tabs 1 tid and reassess at next visit. Call if any concerns.      Relevant Medications   buPROPion (WELLBUTRIN) 100 MG tablet   Allergies    Seasonal and recently increased. Start Zyrtec or Claritin and report any concerns         I have discontinued Charmayne Myint's buPROPion. I am also having her start on buPROPion. Additionally, I am having her maintain her Potassium, aspirin, KRILL OIL PO, Rollator, pramipexole, carisoprodol, omeprazole, nitroGLYCERIN, albuterol, fluticasone, cetirizine, ALPRAZolam, albuterol, benzonatate, doxycycline, and HYDROcodone-acetaminophen.  Meds ordered this encounter  Medications  . buPROPion (WELLBUTRIN) 100 MG tablet    Sig: Take 1 tablet (100 mg total) by mouth 3 (three) times daily.    Dispense:  90 tablet    Refill:  2  . HYDROcodone-acetaminophen (NORCO) 7.5-325 MG tablet    Sig: TAKE 2 TABLETS BY MOUTH TWO TIMES DAILY AS NEEDED    Dispense:  20 tablet    Refill:  0     I discussed the assessment and treatment plan with the patient. The patient was provided an opportunity to ask questions and all were answered. The patient agreed with the plan and demonstrated an understanding of the instructions.   The patient was advised to call back or seek an in-person evaluation if the symptoms worsen or if the condition fails to improve as anticipated.  I provided 25 minutes of non-face-to-face time during this encounter.   Penni Homans, MD

## 2020-07-14 NOTE — Assessment & Plan Note (Signed)
Seasonal and recently increased. Start Zyrtec or Claritin and report any concerns

## 2020-07-15 MED FILL — HYDROCODON-APAP 7.5-325: 7.5-325 | 18 days supply | Qty: 75 | Fill #0

## 2020-07-15 NOTE — Telephone Encounter (Signed)
LMOM to return call.

## 2020-07-19 NOTE — Telephone Encounter (Signed)
[facility] prescription has been sent in to pharmacy.

## 2020-07-29 ENCOUNTER — Telehealth: Payer: Self-pay | Admitting: Family Medicine

## 2020-07-29 NOTE — Telephone Encounter (Signed)
CallerRya Erickson Call Back # [phone]     Pt is calling regarding some forms for disability review (Continuing Disability Review Report)...  She would like to speak with a clinical staff member and mentioned the forms are due by 08/09/2020. Please Advise   Thank you!

## 2020-08-16 ENCOUNTER — Other Ambulatory Visit: Payer: Self-pay | Admitting: Family Medicine

## 2020-08-16 DIAGNOSIS — G9050 Complex regional pain syndrome I, unspecified: Secondary | ICD-10-CM

## 2020-08-16 DIAGNOSIS — M549 Dorsalgia, unspecified: Secondary | ICD-10-CM

## 2020-08-16 MED FILL — HYDROCODON-APAP 7.5-325: 7.5-325 | 19 days supply | Qty: 75 | Fill #0

## 2020-08-16 NOTE — Telephone Encounter (Signed)
Requesting: hydrocodone 7.5-325mg  Contract: 11/17/2016 UDS: 07/22/2018 Last Visit: 07/13/2020  Next Visit: 09/14/2020 Last Refill: 07/14/2020 #20 and 0RF   Please Advise

## 2020-09-14 ENCOUNTER — Other Ambulatory Visit: Payer: Self-pay | Admitting: Family Medicine

## 2020-09-14 ENCOUNTER — Other Ambulatory Visit: Payer: Self-pay

## 2020-09-14 ENCOUNTER — Telehealth (INDEPENDENT_AMBULATORY_CARE_PROVIDER_SITE_OTHER): Payer: Medicare Other | Admitting: Family Medicine

## 2020-09-14 DIAGNOSIS — M549 Dorsalgia, unspecified: Secondary | ICD-10-CM | POA: Diagnosis not present

## 2020-09-14 DIAGNOSIS — R739 Hyperglycemia, unspecified: Secondary | ICD-10-CM

## 2020-09-14 DIAGNOSIS — F418 Other specified anxiety disorders: Secondary | ICD-10-CM

## 2020-09-14 DIAGNOSIS — E782 Mixed hyperlipidemia: Secondary | ICD-10-CM

## 2020-09-14 DIAGNOSIS — M545 Low back pain, unspecified: Secondary | ICD-10-CM

## 2020-09-14 DIAGNOSIS — Z9189 Other specified personal risk factors, not elsewhere classified: Secondary | ICD-10-CM

## 2020-09-14 DIAGNOSIS — M25552 Pain in left hip: Secondary | ICD-10-CM | POA: Diagnosis not present

## 2020-09-14 DIAGNOSIS — G9050 Complex regional pain syndrome I, unspecified: Secondary | ICD-10-CM

## 2020-09-14 DIAGNOSIS — R350 Frequency of micturition: Secondary | ICD-10-CM | POA: Diagnosis not present

## 2020-09-14 DIAGNOSIS — K219 Gastro-esophageal reflux disease without esophagitis: Secondary | ICD-10-CM | POA: Diagnosis not present

## 2020-09-14 MED ORDER — HYDROCODONE-ACETAMINOPHEN 7.5-325 MG PO TABS: 1.0000 | ORAL_TABLET | Freq: Three times a day (TID) | ORAL | 0 refills | Status: DC | PRN

## 2020-09-14 MED ORDER — ALPRAZOLAM 0.25 MG PO TABS: 0.2500 mg | ORAL_TABLET | Freq: Two times a day (BID) | ORAL | 5 refills | Status: DC | PRN

## 2020-09-14 MED ORDER — OMEPRAZOLE 40 MG PO CPDR: 40.0000 mg | DELAYED_RELEASE_CAPSULE | Freq: Two times a day (BID) | ORAL | 1 refills | Status: DC

## 2020-09-14 MED FILL — OMEPRAZOLE 40 MG CPDR: 40 | 90 days supply | Qty: 180 | Fill #0

## 2020-09-14 MED FILL — HYDROCODON-APAP 7.5-325: 7.5-325 | 25 days supply | Qty: 75 | Fill #0

## 2020-09-14 MED FILL — ALPRAZolam 0.25 MG TABS: 0.25 | 15 days supply | Qty: 30 | Fill #0

## 2020-09-14 NOTE — Assessment & Plan Note (Signed)
With increased instability. With her hip feeling like it pops out of joint and has associated pain with it. Proceed with xrays and refer to ortho for evaluation

## 2020-09-14 NOTE — Assessment & Plan Note (Signed)
hgba1c acceptable, minimize simple carbs. Increase exercise as tolerated.  

## 2020-09-14 NOTE — Assessment & Plan Note (Signed)
Encouraged moist heat and gentle stretching as tolerated. May try NSAIDs and prescription meds as directed and report if symptoms worsen or seek immediate care. Check a UA and culture

## 2020-09-14 NOTE — Assessment & Plan Note (Signed)
Check UA and culture 

## 2020-09-14 NOTE — Assessment & Plan Note (Signed)
Encouraged heart healthy diet, increase exercise, avoid trans fats, consider a krill oil cap daily 

## 2020-09-15 ENCOUNTER — Other Ambulatory Visit: Payer: Self-pay

## 2020-09-15 DIAGNOSIS — M545 Low back pain, unspecified: Secondary | ICD-10-CM

## 2020-09-15 DIAGNOSIS — Z79899 Other long term (current) drug therapy: Secondary | ICD-10-CM

## 2020-09-15 NOTE — Assessment & Plan Note (Signed)
Continues to struggle debility and daily pain in left leg

## 2020-09-15 NOTE — Assessment & Plan Note (Signed)
Avoid offending foods, start probiotics. Do not eat large meals in late evening and consider raising head of bed. Well managed on Omeprazole. Refill given today

## 2020-09-15 NOTE — Assessment & Plan Note (Signed)
She reports she generally is doing well. She does use Alprazolam with good results. Is allowed a refill to use prn. She is aware not to take it with her pain medications.

## 2020-09-15 NOTE — Progress Notes (Signed)
Virtual Visit via Video Note  I connected with Beverly Erickson on 09/14/20 at 11:00 AM EST by a video enabled telemedicine application and verified that I am speaking with the correct person using two identifiers.  Location: Patient: home, patient and provider in visit Provider: office   I discussed the limitations of evaluation and management by telemedicine and the availability of in person appointments. The patient expressed understanding and agreed to proceed. S Chism, CMA  Was able to get the patient set up on a video visit   Subjective:    Patient ID: Beverly Erickson, female    DOB: Jan 16, 1971, 49 y.o.   MRN: [id number]  Chief Complaint  Patient presents with  . Follow-up    HPI Patient is in today for follow up on chronic medical concerns. No recent febrile illness or hospitalizations. She continues to struggle with left leg pain and weakness. She had a scary episode recently when she had a sharp pain in her left hip and felt that her hip popped out of place. This has not happened previously or since. She is still under a great deal of stress due to family concerns but she feels she is managing well at this time. Denies CP/palp/SOB/HA/congestion/fevers/GI or GU c/o. Taking meds as prescribed  Past Medical History:  Diagnosis Date  . Anemia   . Chicken pox   . Depression   . Depression with anxiety 08/30/2015  . GERD (gastroesophageal reflux disease)   . Kidney stone 02/13/2014   Right, small  . Neck pain, musculoskeletal 11/17/2016  . Obesity   . Other and unspecified hyperlipidemia 01/14/2014  . Otitis media 11/17/2016  . Preventative health care 07/05/2014  . RSD (reflex sympathetic dystrophy)   . Tinea corporis 06/10/2017  . Tobacco use disorder 01/14/2014   1ppd  . Unspecified constipation 05/25/2014  . Urinary frequency 03/06/2017    Past Surgical History:  Procedure Laterality Date  . CESAREAN SECTION  2000  . plate and screws in left arm  2005   humerus  . WISDOM TOOTH  EXTRACTION  49 yrs old    Family History  Problem Relation Age of Onset  . Arthritis Mother        Living  . Hypertension Mother   . Thrombocytopenia Mother   . Hyperlipidemia Father   . Hypertension Father   . Thrombocytopenia Maternal Grandmother   . Stroke Maternal Grandmother   . Hypertension Maternal Grandmother   . Alcohol abuse Maternal Grandfather   . Heart disease Maternal Grandfather   . Parkinson's disease Maternal Grandfather   . Alcohol abuse Paternal Grandfather   . Cancer Paternal Grandfather   . Hypothyroidism Daughter   . Diabetes Daughter        type 1  . Dementia Paternal Grandmother     Social History   Socioeconomic History  . Marital status: Widowed    Spouse name: Not on file  . Number of children: 3  . Years of education: Not on file  . Highest education level: Not on file  Occupational History  . Not on file  Tobacco Use  . Smoking status: Current Every Day Smoker    Types: Cigarettes  . Smokeless tobacco: Never Used  Substance and Sexual Activity  . Alcohol use: Yes    Comment: once a year.  . Drug use: No  . Sexual activity: Not on file    Comment: lives with son, 8 yo daughter, boyfriend, no dietary  Other Topics Concern  . Not on  file  Social History Narrative  . Not on file   Social Determinants of Health   Financial Resource Strain:   . Difficulty of Paying Living Expenses: Not on file  Food Insecurity:   . Worried About Charity fundraiser in the Last Year: Not on file  . Ran Out of Food in the Last Year: Not on file  Transportation Needs:   . Lack of Transportation (Medical): Not on file  . Lack of Transportation (Non-Medical): Not on file  Physical Activity:   . Days of Exercise per Week: Not on file  . Minutes of Exercise per Session: Not on file  Stress:   . Feeling of Stress : Not on file  Social Connections:   . Frequency of Communication with Friends and Family: Not on file  . Frequency of Social Gatherings  with Friends and Family: Not on file  . Attends Religious Services: Not on file  . Active Member of Clubs or Organizations: Not on file  . Attends Archivist Meetings: Not on file  . Marital Status: Not on file  Intimate Partner Violence:   . Fear of Current or Ex-Partner: Not on file  . Emotionally Abused: Not on file  . Physically Abused: Not on file  . Sexually Abused: Not on file    Outpatient Medications Prior to Visit  Medication Sig Dispense Refill  . albuterol (PROVENTIL HFA;VENTOLIN HFA) 108 (90 Base) MCG/ACT inhaler Inhale 2 puffs into the lungs every 6 (six) hours as needed for wheezing or shortness of breath. 2 Inhaler 2  . albuterol (VENTOLIN HFA) 108 (90 Base) MCG/ACT inhaler Inhale 2 puffs into the lungs every 6 (six) hours as needed for wheezing or shortness of breath. 18 g 2  . aspirin 81 MG tablet Take 81 mg by mouth daily.    . benzonatate (TESSALON) 100 MG capsule Take 1 capsule (100 mg total) by mouth 2 (two) times daily as needed for cough. 20 capsule 0  . buPROPion (WELLBUTRIN) 100 MG tablet Take 1 tablet (100 mg total) by mouth 3 (three) times daily. 90 tablet 2  . carisoprodol (SOMA) 350 MG tablet Take 1 tablet (350 mg total) by mouth 2 (two) times daily as needed for muscle spasms. 60 tablet 01  . cetirizine (ZYRTEC) 10 MG tablet Take 1 tablet (10 mg total) by mouth daily. 30 tablet 11  . doxycycline (VIBRAMYCIN) 100 MG capsule Take 1 capsule (100 mg total) by mouth 2 (two) times daily. 20 capsule 0  . fluticasone (FLONASE) 50 MCG/ACT nasal spray Place 2 sprays into both nostrils daily. 16 g 6  . KRILL OIL PO Take 1 capsule by mouth daily.    . Misc. Devices (ROLLATOR) MISC Patient with severe pain and weakness in left leg Reflex Sympathetic Dystrophy 1 each 0  . nitroGLYCERIN (NITROSTAT) 0.4 MG SL tablet Place 1 tablet (0.4 mg total) under the tongue every 5 (five) minutes as needed for chest pain. 25 tablet 1  . Potassium 75 MG TABS Take 1 each by  mouth at bedtime.    . pramipexole (MIRAPEX) 1 MG tablet Take 1 tablet (1 mg total) by mouth at bedtime. 30 tablet 2  . ALPRAZolam (XANAX) 0.25 MG tablet TAKE 1 TABLET (0.25 MG TOTAL) BY MOUTH 2 (TWO) TIMES DAILY AS NEEDED FOR ANXIETY 30 tablet 5  . HYDROcodone-acetaminophen (NORCO) 7.5-325 MG tablet TAKE 2 TABLETS BY MOUTH TWO TIMES DAILY AS NEEDED 20 tablet 0  . HYDROcodone-acetaminophen (NORCO) 7.5-325 MG tablet  TAKE 2 TABLETS BY MOUTH TWO TIMES DAILY AS NEEDED 75 tablet 0  . omeprazole (PRILOSEC) 40 MG capsule Take 1 capsule (40 mg total) by mouth 2 (two) times daily. 180 capsule 1   No facility-administered medications prior to visit.    Allergies  Allergen Reactions  . Codeine Nausea Only  . Mucinex [Guaifenesin Er] Other (See Comments)    Nightmares    Review of Systems  Constitutional: Positive for malaise/fatigue. Negative for fever.  HENT: Negative for congestion.   Eyes: Negative for blurred vision.  Respiratory: Negative for shortness of breath.   Cardiovascular: Negative for chest pain, palpitations and leg swelling.  Gastrointestinal: Positive for heartburn. Negative for abdominal pain, blood in stool and nausea.  Genitourinary: Negative for dysuria and frequency.  Musculoskeletal: Positive for back pain, joint pain and myalgias. Negative for falls.  Skin: Negative for rash.  Neurological: Negative for dizziness, loss of consciousness and headaches.  Endo/Heme/Allergies: Negative for environmental allergies.  Psychiatric/Behavioral: Negative for depression. The patient is not nervous/anxious.        Objective:    Physical Exam Constitutional:      Appearance: Normal appearance. She is obese. She is not ill-appearing.  HENT:     Head: Normocephalic and atraumatic.     Right Ear: External ear normal.     Left Ear: External ear normal.  Eyes:     General:        Right eye: No discharge.        Left eye: No discharge.  Pulmonary:     Effort: Pulmonary effort  is normal.  Neurological:     Mental Status: She is alert and oriented to person, place, and time.  Psychiatric:        Behavior: Behavior normal.     There were no vitals taken for this visit. Wt Readings from Last 3 Encounters:  05/19/19 200 lb 6.4 oz (90.9 kg)  09/06/18 243 lb (110.2 kg)  07/22/18 251 lb (113.9 kg)    Diabetic Foot Exam - Simple   No data filed     Lab Results  Component Value Date   WBC 6.1 05/19/2019   HGB 14.1 05/19/2019   HCT 41.8 05/19/2019   PLT 225.0 05/19/2019   GLUCOSE 72 05/19/2019   CHOL 154 05/19/2019   TRIG 52.0 05/19/2019   HDL 43.00 05/19/2019   LDLCALC 101 (H) 05/19/2019   ALT 10 05/19/2019   AST 11 05/19/2019   NA 142 05/19/2019   K 4.5 05/19/2019   CL 110 05/19/2019   CREATININE 0.74 05/19/2019   BUN 22 05/19/2019   CO2 24 05/19/2019   TSH 1.08 05/19/2019   HGBA1C 5.3 05/19/2019    Lab Results  Component Value Date   TSH 1.08 05/19/2019   Lab Results  Component Value Date   WBC 6.1 05/19/2019   HGB 14.1 05/19/2019   HCT 41.8 05/19/2019   MCV 92.2 05/19/2019   PLT 225.0 05/19/2019   Lab Results  Component Value Date   NA 142 05/19/2019   K 4.5 05/19/2019   CO2 24 05/19/2019   GLUCOSE 72 05/19/2019   BUN 22 05/19/2019   CREATININE 0.74 05/19/2019   BILITOT 0.4 05/19/2019   ALKPHOS 59 05/19/2019   AST 11 05/19/2019   ALT 10 05/19/2019   PROT 6.6 05/19/2019   ALBUMIN 4.2 05/19/2019   CALCIUM 9.4 05/19/2019   GFR 84.05 05/19/2019   Lab Results  Component Value Date   CHOL 154 05/19/2019  Lab Results  Component Value Date   HDL 43.00 05/19/2019   Lab Results  Component Value Date   LDLCALC 101 (H) 05/19/2019   Lab Results  Component Value Date   TRIG 52.0 05/19/2019   Lab Results  Component Value Date   CHOLHDL 4 05/19/2019   Lab Results  Component Value Date   HGBA1C 5.3 05/19/2019       Assessment & Plan:   Problem List Items Addressed This Visit    GERD (gastroesophageal reflux  disease)    Avoid offending foods, start probiotics. Do not eat large meals in late evening and consider raising head of bed. Well managed on Omeprazole. Refill given today      Relevant Medications   omeprazole (PRILOSEC) 40 MG capsule   RSD (reflex sympathetic dystrophy)    Continues to struggle debility and daily pain in left leg      Relevant Medications   ALPRAZolam (XANAX) 0.25 MG tablet   HYDROcodone-acetaminophen (NORCO) 7.5-325 MG tablet   Hyperglycemia    hgba1c acceptable, minimize simple carbs. Increase exercise as tolerated.       Relevant Orders   Hemoglobin A1c   CBC   TSH   Hyperlipidemia, mixed    Encouraged heart healthy diet, increase exercise, avoid trans fats, consider a krill oil cap daily      Relevant Orders   CBC   Comprehensive metabolic panel   Lipid panel   TSH   Mid back pain on right side   Relevant Medications   ALPRAZolam (XANAX) 0.25 MG tablet   HYDROcodone-acetaminophen (NORCO) 7.5-325 MG tablet   Urinary frequency    Check UA and culture      Relevant Orders   Urinalysis   Urine Culture   Low back pain    Encouraged moist heat and gentle stretching as tolerated. May try NSAIDs and prescription meds as directed and report if symptoms worsen or seek immediate care. Check a UA and culture      Relevant Medications   HYDROcodone-acetaminophen (NORCO) 7.5-325 MG tablet   Other Relevant Orders   CBC   DG Lumbar Spine 2-3 Views   Ambulatory referral to Orthopedic Surgery   Urinalysis   Urine Culture   Left hip pain    With increased instability. With her hip feeling like it pops out of joint and has associated pain with it. Proceed with xrays and refer to ortho for evaluation      Relevant Orders   DG Hip Unilat W OR W/O Pelvis 2-3 Views Left   Ambulatory referral to Orthopedic Surgery    Other Visit Diagnoses    At high risk for breast cancer    -  Primary   Relevant Orders   MM DIAG BREAST TOMO BILATERAL   Gastroesophageal  reflux disease       Relevant Medications   omeprazole (PRILOSEC) 40 MG capsule      I have discontinued Ivin Booty Wunder's HYDROcodone-acetaminophen. I have also changed her ALPRAZolam and HYDROcodone-acetaminophen. Additionally, I am having her maintain her Potassium, aspirin, KRILL OIL PO, Rollator, pramipexole, carisoprodol, nitroGLYCERIN, albuterol, fluticasone, cetirizine, albuterol, benzonatate, doxycycline, buPROPion, and omeprazole.  Meds ordered this encounter  Medications  . ALPRAZolam (XANAX) 0.25 MG tablet    Sig: Take 1 tablet (0.25 mg total) by mouth 2 (two) times daily as needed. for anxiety    Dispense:  30 tablet    Refill:  5  . HYDROcodone-acetaminophen (NORCO) 7.5-325 MG tablet    Sig: Take 1  tablet by mouth 3 (three) times daily as needed for moderate pain.    Dispense:  75 tablet    Refill:  0  . omeprazole (PRILOSEC) 40 MG capsule    Sig: Take 1 capsule (40 mg total) by mouth 2 (two) times daily.    Dispense:  180 capsule    Refill:  1     I discussed the assessment and treatment plan with the patient. The patient was provided an opportunity to ask questions and all were answered. The patient agreed with the plan and demonstrated an understanding of the instructions.   The patient was advised to call back or seek an in-person evaluation if the symptoms worsen or if the condition fails to improve as anticipated.  I provided 25 minutes of non-face-to-face time during this encounter.   Penni Homans, MD

## 2020-09-23 ENCOUNTER — Other Ambulatory Visit: Payer: Medicare Other

## 2020-10-14 ENCOUNTER — Other Ambulatory Visit: Payer: Self-pay | Admitting: Family Medicine

## 2020-10-14 DIAGNOSIS — G9050 Complex regional pain syndrome I, unspecified: Secondary | ICD-10-CM

## 2020-10-14 DIAGNOSIS — M549 Dorsalgia, unspecified: Secondary | ICD-10-CM

## 2020-10-14 MED FILL — HYDROCODON-APAP 7.5-325: 7.5-325 | 25 days supply | Qty: 75 | Fill #0

## 2020-10-14 NOTE — Telephone Encounter (Signed)
Requesting: hydrocodone 7.5-325mg  Contract: 11/17/2016 UDS: 07/22/2018 Last Visit: 09/14/2020 Next Visit: 12/02/2020 Last Refill: 09/14/2020 #75 and 0RF Pt sig: 1 tab tid prn  Please Advise

## 2020-11-15 ENCOUNTER — Other Ambulatory Visit: Payer: Self-pay | Admitting: Family Medicine

## 2020-11-15 DIAGNOSIS — G9050 Complex regional pain syndrome I, unspecified: Secondary | ICD-10-CM

## 2020-11-15 DIAGNOSIS — M549 Dorsalgia, unspecified: Secondary | ICD-10-CM

## 2020-11-15 NOTE — Telephone Encounter (Signed)
Requesting: hydrocodone 7.5-325mg  Contract: 11/17/2016 UDS: ordered 09/15/2020 (future) Last Visit: 09/14/2020  Next Visit: 12/02/20 Last Refill: 10/14/2020 #75 and 0RF  Please Advise

## 2020-11-16 MED FILL — HYDROCODON-APAP 7.5-325: 7.5-325 | 25 days supply | Qty: 75 | Fill #0

## 2020-12-02 ENCOUNTER — Other Ambulatory Visit: Payer: Self-pay

## 2020-12-02 ENCOUNTER — Telehealth: Payer: Medicare Other | Admitting: Family Medicine

## 2020-12-14 ENCOUNTER — Other Ambulatory Visit: Payer: Self-pay | Admitting: Family Medicine

## 2020-12-14 DIAGNOSIS — G9050 Complex regional pain syndrome I, unspecified: Secondary | ICD-10-CM

## 2020-12-14 DIAGNOSIS — M549 Dorsalgia, unspecified: Secondary | ICD-10-CM

## 2020-12-14 NOTE — Telephone Encounter (Signed)
Requesting: hydrocodone 7.5-325mg  Contract: 11/17/2016 UDS: ordered on 09/15/2020- not done Last Visit: 09/14/2020 Next Visit: None Last Refill: 11/15/2020 #75 and 0RF Pt sig: 1 tab tid prn  Please Advise

## 2020-12-15 MED FILL — HYDROCODON-APAP 7.5-325: 7.5-325 | 25 days supply | Qty: 75 | Fill #0

## 2021-01-17 ENCOUNTER — Other Ambulatory Visit: Payer: Self-pay | Admitting: Family Medicine

## 2021-01-17 ENCOUNTER — Other Ambulatory Visit (HOSPITAL_BASED_OUTPATIENT_CLINIC_OR_DEPARTMENT_OTHER): Payer: Self-pay

## 2021-01-17 DIAGNOSIS — G9050 Complex regional pain syndrome I, unspecified: Secondary | ICD-10-CM

## 2021-01-17 DIAGNOSIS — M549 Dorsalgia, unspecified: Secondary | ICD-10-CM

## 2021-01-17 MED ORDER — HYDROCODONE-ACETAMINOPHEN 7.5-325 MG PO TABS
ORAL_TABLET | ORAL | 0 refills | Status: DC
  Filled 2021-01-17: qty 75, 25d supply, fill #0

## 2021-01-17 NOTE — Telephone Encounter (Signed)
Requesting: hydrocodone 7.5-325mg  Contract: 11/17/2016 UDS: ordered 09/15/20- not done yet Last Visit: 09/14/2020 Next Visit: None Last Refill: 12/14/2020 #75 and 0RF  Please Advise

## 2021-01-17 NOTE — Telephone Encounter (Signed)
I have refilled but she is coming up on 6 months since she was seen so please schedule her an appt in the next 1-2 months

## 2021-02-16 ENCOUNTER — Other Ambulatory Visit: Payer: Self-pay | Admitting: Family Medicine

## 2021-02-16 ENCOUNTER — Other Ambulatory Visit (HOSPITAL_BASED_OUTPATIENT_CLINIC_OR_DEPARTMENT_OTHER): Payer: Self-pay

## 2021-02-16 DIAGNOSIS — M549 Dorsalgia, unspecified: Secondary | ICD-10-CM

## 2021-02-16 DIAGNOSIS — G9050 Complex regional pain syndrome I, unspecified: Secondary | ICD-10-CM

## 2021-02-16 MED ORDER — HYDROCODONE-ACETAMINOPHEN 7.5-325 MG PO TABS
ORAL_TABLET | ORAL | 0 refills | Status: DC
  Filled 2021-02-16: qty 75, 25d supply, fill #0

## 2021-02-16 NOTE — Telephone Encounter (Signed)
Requesting: hydrocodone Contract: 11/17/16 UDS:07/22/18 Last Visit: 09/14/20 Next Visit: 03/07/21 Last Refill: 01/17/21  Will try and set up at next visit.  Visits have been virtual (needs in person) Please Advise

## 2021-02-17 ENCOUNTER — Other Ambulatory Visit (HOSPITAL_BASED_OUTPATIENT_CLINIC_OR_DEPARTMENT_OTHER): Payer: Self-pay

## 2021-03-07 ENCOUNTER — Telehealth (INDEPENDENT_AMBULATORY_CARE_PROVIDER_SITE_OTHER): Payer: Medicare Other | Admitting: Family Medicine

## 2021-03-07 ENCOUNTER — Encounter: Payer: Self-pay | Admitting: Family Medicine

## 2021-03-07 ENCOUNTER — Other Ambulatory Visit (HOSPITAL_BASED_OUTPATIENT_CLINIC_OR_DEPARTMENT_OTHER): Payer: Self-pay

## 2021-03-07 ENCOUNTER — Other Ambulatory Visit: Payer: Self-pay

## 2021-03-07 DIAGNOSIS — M549 Dorsalgia, unspecified: Secondary | ICD-10-CM

## 2021-03-07 DIAGNOSIS — T148XXA Other injury of unspecified body region, initial encounter: Secondary | ICD-10-CM | POA: Diagnosis not present

## 2021-03-07 DIAGNOSIS — G9050 Complex regional pain syndrome I, unspecified: Secondary | ICD-10-CM | POA: Diagnosis not present

## 2021-03-07 MED ORDER — HYDROCODONE-ACETAMINOPHEN 7.5-325 MG PO TABS
ORAL_TABLET | ORAL | 0 refills | Status: DC
  Filled 2021-03-07: qty 90, 30d supply, fill #0

## 2021-03-07 NOTE — Assessment & Plan Note (Signed)
Left leg. She notes a bruise with a hard spot in place. She is advised it will take months for calcifications in bruises to resolve sometimes. She will notify us if worsens.

## 2021-03-07 NOTE — Progress Notes (Signed)
MyChart Video Visit    Virtual Visit via Video Note   This visit type was conducted due to national recommendations for restrictions regarding the COVID-19 Pandemic (e.g. social distancing) in an effort to limit this patient's exposure and mitigate transmission in our community. This patient is at least at moderate risk for complications without adequate follow up. This format is felt to be most appropriate for this patient at this time. Physical exam was limited by quality of the video and audio technology used for the visit. CMA was able to get the patient set up on a video visit.  Patient location: home Patient and provider in visit Provider location: Office  I discussed the limitations of evaluation and management by telemedicine and the availability of in person appointments. The patient expressed understanding and agreed to proceed.  Visit Date: 03/07/2021  Today's healthcare provider: Penni Homans, MD     Subjective:    Patient ID: Beverly Erickson, female    DOB: 02-24-1971, 50 y.o.   MRN: [id number]  Chief Complaint  Patient presents with  . Follow-up    HPI Patient is in today for evaluation of her complex regional pain syndrome. No recent febrile illness or hospitalizations. She denies any recent fall or trauma but does note a bruise with a hard knot in in on her left leg recently. She works a part time job at [employer] and her hours have changed recently so she is now asked to be on her 49 or more hours a day and her pain becomes unbareable. Denies CP/palp/SOB/HA/congestion/fevers/GI or GU c/o. Taking meds as prescribed  Past Medical History:  Diagnosis Date  . Anemia   . Chicken pox   . Depression   . Depression with anxiety 08/30/2015  . GERD (gastroesophageal reflux disease)   . Kidney stone 02/13/2014   Right, small  . Neck pain, musculoskeletal 11/17/2016  . Obesity   . Other and unspecified hyperlipidemia 01/14/2014  . Otitis media 11/17/2016  .  Preventative health care 07/05/2014  . RSD (reflex sympathetic dystrophy)   . Tinea corporis 06/10/2017  . Tobacco use disorder 01/14/2014   1ppd  . Unspecified constipation 05/25/2014  . Urinary frequency 03/06/2017    Past Surgical History:  Procedure Laterality Date  . CESAREAN SECTION  2000  . plate and screws in left arm  2005   humerus  . WISDOM TOOTH EXTRACTION  50 yrs old    Family History  Problem Relation Age of Onset  . Arthritis Mother        Living  . Hypertension Mother   . Thrombocytopenia Mother   . Hyperlipidemia Father   . Hypertension Father   . Thrombocytopenia Maternal Grandmother   . Stroke Maternal Grandmother   . Hypertension Maternal Grandmother   . Alcohol abuse Maternal Grandfather   . Heart disease Maternal Grandfather   . Parkinson's disease Maternal Grandfather   . Alcohol abuse Paternal Grandfather   . Cancer Paternal Grandfather   . Hypothyroidism Daughter   . Diabetes Daughter        type 1  . Dementia Paternal Grandmother     Social History   Socioeconomic History  . Marital status: Widowed    Spouse name: Not on file  . Number of children: 3  . Years of education: Not on file  . Highest education level: Not on file  Occupational History  . Not on file  Tobacco Use  . Smoking status: Current Every Day Smoker  Types: Cigarettes  . Smokeless tobacco: Never Used  Substance and Sexual Activity  . Alcohol use: Yes    Comment: once a year.  . Drug use: No  . Sexual activity: Not on file    Comment: lives with son, 54 yo daughter, boyfriend, no dietary  Other Topics Concern  . Not on file  Social History Narrative  . Not on file   Social Determinants of Health   Financial Resource Strain: Not on file  Food Insecurity: Not on file  Transportation Needs: Not on file  Physical Activity: Not on file  Stress: Not on file  Social Connections: Not on file  Intimate Partner Violence: Not on file    Outpatient Medications Prior  to Visit  Medication Sig Dispense Refill  . albuterol (PROVENTIL HFA;VENTOLIN HFA) 108 (90 Base) MCG/ACT inhaler Inhale 2 puffs into the lungs every 6 (six) hours as needed for wheezing or shortness of breath. 2 Inhaler 2  . albuterol (VENTOLIN HFA) 108 (90 Base) MCG/ACT inhaler Inhale 2 puffs into the lungs every 6 (six) hours as needed for wheezing or shortness of breath. 18 g 2  . ALPRAZolam (XANAX) 0.25 MG tablet TAKE 1 TABLET (0.25 MG TOTAL) BY MOUTH 2 (TWO) TIMES DAILY AS NEEDED. FOR ANXIETY 30 tablet 5  . aspirin 81 MG tablet Take 81 mg by mouth daily.    Marland Kitchen buPROPion (WELLBUTRIN) 100 MG tablet Take 1 tablet (100 mg total) by mouth 3 (three) times daily. 90 tablet 2  . carisoprodol (SOMA) 350 MG tablet Take 1 tablet (350 mg total) by mouth 2 (two) times daily as needed for muscle spasms. 60 tablet 01  . cetirizine (ZYRTEC) 10 MG tablet Take 1 tablet (10 mg total) by mouth daily. 30 tablet 11  . fluticasone (FLONASE) 50 MCG/ACT nasal spray Place 2 sprays into both nostrils daily. 16 g 6  . KRILL OIL PO Take 1 capsule by mouth daily.    . Misc. Devices (ROLLATOR) MISC Patient with severe pain and weakness in left leg Reflex Sympathetic Dystrophy 1 each 0  . nitroGLYCERIN (NITROSTAT) 0.4 MG SL tablet Place 1 tablet (0.4 mg total) under the tongue every 5 (five) minutes as needed for chest pain. 25 tablet 1  . omeprazole (PRILOSEC) 40 MG capsule Take 1 capsule (40 mg total) by mouth 2 (two) times daily. 180 capsule 1  . Potassium 75 MG TABS Take 1 each by mouth at bedtime.    . pramipexole (MIRAPEX) 1 MG tablet Take 1 tablet (1 mg total) by mouth at bedtime. 30 tablet 2  . HYDROcodone-acetaminophen (NORCO) 7.5-325 MG tablet TAKE 1 TABLET BY MOUTH 3 TIMES DAILY AS NEEDED FOR MODERATE PAIN 75 tablet 0  . benzonatate (TESSALON) 100 MG capsule Take 1 capsule (100 mg total) by mouth 2 (two) times daily as needed for cough. (Patient not taking: Reported on 03/07/2021) 20 capsule 0  . doxycycline  (VIBRAMYCIN) 100 MG capsule Take 1 capsule (100 mg total) by mouth 2 (two) times daily. (Patient not taking: Reported on 03/07/2021) 20 capsule 0   No facility-administered medications prior to visit.    Allergies  Allergen Reactions  . Codeine Nausea Only  . Mucinex [Guaifenesin Er] Other (See Comments)    Nightmares    Review of Systems  Constitutional: Negative for fever and malaise/fatigue.  HENT: Negative for congestion.   Eyes: Negative for blurred vision.  Respiratory: Negative for shortness of breath.   Cardiovascular: Negative for chest pain, palpitations and leg swelling.  Gastrointestinal: Negative for abdominal pain, blood in stool and nausea.  Genitourinary: Negative for dysuria and frequency.  Musculoskeletal: Positive for joint pain and myalgias. Negative for falls.  Skin: Negative for rash.  Neurological: Negative for dizziness, focal weakness, loss of consciousness and headaches.  Endo/Heme/Allergies: Negative for environmental allergies. Bruises/bleeds easily.  Psychiatric/Behavioral: Negative for depression. The patient is not nervous/anxious.        Objective:    Physical Exam Constitutional:      Appearance: Normal appearance. She is obese. She is not ill-appearing.  HENT:     Head: Normocephalic and atraumatic.     Nose: Nose normal.  Eyes:     General:        Right eye: No discharge.        Left eye: No discharge.  Pulmonary:     Effort: Pulmonary effort is normal.  Neurological:     Mental Status: She is alert and oriented to person, place, and time.  Psychiatric:        Behavior: Behavior normal.     BP 121/77 (BP Location: Left Wrist, Patient Position: Sitting, Cuff Size: Large)   Pulse 81   Ht 5\' 7"  (1.702 m)   Wt 223 lb (101.2 kg)   BMI 34.93 kg/m  Wt Readings from Last 3 Encounters:  03/07/21 223 lb (101.2 kg)  05/19/19 200 lb 6.4 oz (90.9 kg)  09/06/18 243 lb (110.2 kg)    Diabetic Foot Exam - Simple   No data filed    Lab  Results  Component Value Date   WBC 6.1 05/19/2019   HGB 14.1 05/19/2019   HCT 41.8 05/19/2019   PLT 225.0 05/19/2019   GLUCOSE 72 05/19/2019   CHOL 154 05/19/2019   TRIG 52.0 05/19/2019   HDL 43.00 05/19/2019   LDLCALC 101 (H) 05/19/2019   ALT 10 05/19/2019   AST 11 05/19/2019   NA 142 05/19/2019   K 4.5 05/19/2019   CL 110 05/19/2019   CREATININE 0.74 05/19/2019   BUN 22 05/19/2019   CO2 24 05/19/2019   TSH 1.08 05/19/2019   HGBA1C 5.3 05/19/2019    Lab Results  Component Value Date   TSH 1.08 05/19/2019   Lab Results  Component Value Date   WBC 6.1 05/19/2019   HGB 14.1 05/19/2019   HCT 41.8 05/19/2019   MCV 92.2 05/19/2019   PLT 225.0 05/19/2019   Lab Results  Component Value Date   NA 142 05/19/2019   K 4.5 05/19/2019   CO2 24 05/19/2019   GLUCOSE 72 05/19/2019   BUN 22 05/19/2019   CREATININE 0.74 05/19/2019   BILITOT 0.4 05/19/2019   ALKPHOS 59 05/19/2019   AST 11 05/19/2019   ALT 10 05/19/2019   PROT 6.6 05/19/2019   ALBUMIN 4.2 05/19/2019   CALCIUM 9.4 05/19/2019   GFR 84.05 05/19/2019   Lab Results  Component Value Date   CHOL 154 05/19/2019   Lab Results  Component Value Date   HDL 43.00 05/19/2019   Lab Results  Component Value Date   LDLCALC 101 (H) 05/19/2019   Lab Results  Component Value Date   TRIG 52.0 05/19/2019   Lab Results  Component Value Date   CHOLHDL 4 05/19/2019   Lab Results  Component Value Date   HGBA1C 5.3 05/19/2019       Assessment & Plan:   Problem List Items Addressed This Visit    RSD (reflex sympathetic dystrophy)   Relevant Medications   HYDROcodone-acetaminophen ([place])  7.5-325 MG tablet   Mid back pain on right side   Relevant Medications   HYDROcodone-acetaminophen (NORCO) 7.5-325 MG tablet   Bruise    Left leg. She notes a bruise with a hard spot in place. She is advised it will take months for calcifications in bruises to resolve sometimes. She will notify us if worsens.         I  am having Beverly Erickson maintain her Potassium, aspirin, KRILL OIL PO, Rollator, pramipexole, carisoprodol, nitroGLYCERIN, albuterol, fluticasone, cetirizine, albuterol, benzonatate, doxycycline, buPROPion, omeprazole, ALPRAZolam, and HYDROcodone-acetaminophen.  Meds ordered this encounter  Medications  . HYDROcodone-acetaminophen (NORCO) 7.5-325 MG tablet    Sig: TAKE 1 TABLET BY MOUTH 3 TIMES DAILY AS NEEDED FOR MODERATE PAIN    Dispense:  90 tablet    Refill:  0    I discussed the assessment and treatment plan with the patient. The patient was provided an opportunity to ask questions and all were answered. The patient agreed with the plan and demonstrated an understanding of the instructions.   The patient was advised to call back or seek an in-person evaluation if the symptoms worsen or if the condition fails to improve as anticipated.  I provided 22 minutes of face-to-face time during this encounter.   Penni Homans, MD [facility] [phone] (phone) [phone] (fax)  [place]

## 2021-03-07 NOTE — Assessment & Plan Note (Signed)
She works a part time job at [employer] and her hours have changed recently so she is now asked to be on her 63 or more hours a day and her pain becomes unbareable. She is given permission to take 3 of her Hydrocodone daily on a more regular basis and see if that helps enough. May have to pursue FMLA and ask that she go back to shorter 4-5 hour shifts which she was tolerating. Discussed her pain and plan of care for roughly 20 minutes

## 2021-04-11 ENCOUNTER — Other Ambulatory Visit: Payer: Self-pay | Admitting: Family Medicine

## 2021-04-11 DIAGNOSIS — M549 Dorsalgia, unspecified: Secondary | ICD-10-CM

## 2021-04-11 DIAGNOSIS — G9050 Complex regional pain syndrome I, unspecified: Secondary | ICD-10-CM

## 2021-04-12 MED ORDER — HYDROCODONE-ACETAMINOPHEN 7.5-325 MG PO TABS: ORAL_TABLET | ORAL | 0 refills | Status: DC

## 2021-04-12 NOTE — Telephone Encounter (Signed)
Requesting: hydrocodone Contract:  UDS:07/22/18 Last Visit: 03/07/21  Next Visit: 07/18/21 Last Refill: 03/07/21  Please Advise

## 2021-04-15 ENCOUNTER — Telehealth: Payer: Self-pay

## 2021-04-15 ENCOUNTER — Other Ambulatory Visit: Payer: Self-pay | Admitting: Family Medicine

## 2021-04-15 ENCOUNTER — Encounter: Payer: Self-pay | Admitting: Family Medicine

## 2021-04-15 ENCOUNTER — Other Ambulatory Visit: Payer: Self-pay

## 2021-04-15 ENCOUNTER — Other Ambulatory Visit (HOSPITAL_BASED_OUTPATIENT_CLINIC_OR_DEPARTMENT_OTHER): Payer: Self-pay

## 2021-04-15 DIAGNOSIS — G9050 Complex regional pain syndrome I, unspecified: Secondary | ICD-10-CM

## 2021-04-15 DIAGNOSIS — M549 Dorsalgia, unspecified: Secondary | ICD-10-CM

## 2021-04-15 MED ORDER — HYDROCODONE-ACETAMINOPHEN 7.5-325 MG PO TABS: 1.0000 | ORAL_TABLET | Freq: Three times a day (TID) | ORAL | 0 refills | Status: DC | PRN

## 2021-04-15 NOTE — Telephone Encounter (Signed)
Patient came into office states she went to [place] trying to pick up script but it stated it was discontinued I see where it was " printed " on 04/12/2021 , patient wants medication sent to   [place] on [address]

## 2021-04-15 NOTE — Telephone Encounter (Signed)
Medication refill

## 2021-04-19 ENCOUNTER — Other Ambulatory Visit (HOSPITAL_BASED_OUTPATIENT_CLINIC_OR_DEPARTMENT_OTHER): Payer: Self-pay

## 2021-05-20 ENCOUNTER — Other Ambulatory Visit: Payer: Self-pay | Admitting: Family Medicine

## 2021-05-20 DIAGNOSIS — M549 Dorsalgia, unspecified: Secondary | ICD-10-CM

## 2021-05-20 DIAGNOSIS — G9050 Complex regional pain syndrome I, unspecified: Secondary | ICD-10-CM

## 2021-05-23 MED ORDER — HYDROCODONE-ACETAMINOPHEN 7.5-325 MG PO TABS: 1.0000 | ORAL_TABLET | Freq: Three times a day (TID) | ORAL | 0 refills | Status: DC | PRN

## 2021-05-23 NOTE — Telephone Encounter (Signed)
Requesting: hydrocodone 7.5-'325mg'$  Contract: 11/17/2016 UDS: 07/22/2018 Last Visit: 03/07/2021 Next Visit: 07/18/2021 Last Refill: 04/15/2021 #90 and 0RF  Please Advise

## 2021-06-21 ENCOUNTER — Other Ambulatory Visit (HOSPITAL_BASED_OUTPATIENT_CLINIC_OR_DEPARTMENT_OTHER): Payer: Self-pay

## 2021-06-22 ENCOUNTER — Other Ambulatory Visit: Payer: Self-pay | Admitting: Family Medicine

## 2021-06-22 ENCOUNTER — Other Ambulatory Visit (HOSPITAL_BASED_OUTPATIENT_CLINIC_OR_DEPARTMENT_OTHER): Payer: Self-pay

## 2021-06-22 DIAGNOSIS — G9050 Complex regional pain syndrome I, unspecified: Secondary | ICD-10-CM

## 2021-06-22 DIAGNOSIS — M549 Dorsalgia, unspecified: Secondary | ICD-10-CM

## 2021-06-22 MED ORDER — HYDROCODONE-ACETAMINOPHEN 7.5-325 MG PO TABS
1.0000 | ORAL_TABLET | Freq: Three times a day (TID) | ORAL | 0 refills | Status: DC | PRN
  Filled 2021-06-22: qty 90, 30d supply, fill #0

## 2021-06-22 NOTE — Telephone Encounter (Signed)
Requesting: hydrodocone Contract:  UDS: 08/01/18 Last Visit: 03/07/21 Next Visit: 07/18/21 Last Refill: 05/23/21  Please Advise

## 2021-06-23 ENCOUNTER — Other Ambulatory Visit (HOSPITAL_BASED_OUTPATIENT_CLINIC_OR_DEPARTMENT_OTHER): Payer: Self-pay

## 2021-07-09 ENCOUNTER — Ambulatory Visit (INDEPENDENT_AMBULATORY_CARE_PROVIDER_SITE_OTHER): Payer: Medicare Other

## 2021-07-09 VITALS — Ht 67.0 in | Wt 230.0 lb

## 2021-07-09 DIAGNOSIS — Z0000 Encounter for general adult medical examination without abnormal findings: Secondary | ICD-10-CM

## 2021-07-09 DIAGNOSIS — Z1231 Encounter for screening mammogram for malignant neoplasm of breast: Secondary | ICD-10-CM | POA: Diagnosis not present

## 2021-07-09 DIAGNOSIS — Z599 Problem related to housing and economic circumstances, unspecified: Secondary | ICD-10-CM | POA: Diagnosis not present

## 2021-07-09 DIAGNOSIS — Z5941 Food insecurity: Secondary | ICD-10-CM

## 2021-07-09 NOTE — Patient Instructions (Addendum)
Ms. Beverly Erickson , Thank you for taking time to come for your Medicare Wellness Visit. I appreciate your ongoing commitment to your health goals. Please review the following plan we discussed and let me know if I can assist you in the future.   Screening recommendations/referrals: Colonoscopy: Due at age 50 Mammogram: Done 2015 - Repeat annually *past due - ordered today Bone Density: Due at age 81 Recommended yearly ophthalmology/optometry visit for glaucoma screening and checkup Recommended yearly dental visit for hygiene and checkup  Vaccinations: Influenza vaccine: Declined Pneumococcal vaccine: Declined Tdap vaccine: Done 02/18/2016 - Repeat in 10 years  Shingles vaccine:  Due at age 77 Covid-19: Declined  Advanced directives: Advance directive discussed with you today. Even though you declined this today, please call our office should you change your mind, and we can give you the proper paperwork for you to fill out.   Conditions/risks identified: Aim for 30 minutes of exercise or brisk walking each day, drink 6-8 glasses of water and eat lots of fruits and vegetables.   Next appointment: Follow up in one year for your annual wellness visit.   Preventive Care 40-64 Years, Female Preventive care refers to lifestyle choices and visits with your health care provider that can promote health and wellness. What does preventive care include? A yearly physical exam. This is also called an annual well check. Dental exams once or twice a year. Routine eye exams. Ask your health care provider how often you should have your eyes checked. Personal lifestyle choices, including: Daily care of your teeth and gums. Regular physical activity. Eating a healthy diet. Avoiding tobacco and drug use. Limiting alcohol use. Practicing safe sex. Taking low-dose aspirin daily starting at age 43. Taking vitamin and mineral supplements as recommended by your health care provider. What happens during an annual  well check? The services and screenings done by your health care provider during your annual well check will depend on your age, overall health, lifestyle risk factors, and family history of disease. Counseling  Your health care provider may ask you questions about your: Alcohol use. Tobacco use. Drug use. Emotional well-being. Home and relationship well-being. Sexual activity. Eating habits. Work and work Statistician. Method of birth control. Menstrual cycle. Pregnancy history. Screening  You may have the following tests or measurements: Height, weight, and BMI. Blood pressure. Lipid and cholesterol levels. These may be checked every 5 years, or more frequently if you are over 64 years old. Skin check. Lung cancer screening. You may have this screening every year starting at age 67 if you have a 30-pack-year history of smoking and currently smoke or have quit within the past 15 years. Fecal occult blood test (FOBT) of the stool. You may have this test every year starting at age 10. Flexible sigmoidoscopy or colonoscopy. You may have a sigmoidoscopy every 5 years or a colonoscopy every 10 years starting at age 6. Hepatitis C blood test. Hepatitis B blood test. Sexually transmitted disease (STD) testing. Diabetes screening. This is done by checking your blood sugar (glucose) after you have not eaten for a while (fasting). You may have this done every 1-3 years. Mammogram. This may be done every 1-2 years. Talk to your health care provider about when you should start having regular mammograms. This may depend on whether you have a family history of breast cancer. BRCA-related cancer screening. This may be done if you have a family history of breast, ovarian, tubal, or peritoneal cancers. Pelvic exam and Pap test. This may  be done every 3 years starting at age 37. Starting at age 38, this may be done every 5 years if you have a Pap test in combination with an HPV test. Bone density scan.  This is done to screen for osteoporosis. You may have this scan if you are at high risk for osteoporosis. Discuss your test results, treatment options, and if necessary, the need for more tests with your health care provider. Vaccines  Your health care provider may recommend certain vaccines, such as: Influenza vaccine. This is recommended every year. Tetanus, diphtheria, and acellular pertussis (Tdap, Td) vaccine. You may need a Td booster every 10 years. Zoster vaccine. You may need this after age 43. Pneumococcal 13-valent conjugate (PCV13) vaccine. You may need this if you have certain conditions and were not previously vaccinated. Pneumococcal polysaccharide (PPSV23) vaccine. You may need one or two doses if you smoke cigarettes or if you have certain conditions. Talk to your health care provider about which screenings and vaccines you need and how often you need them. This information is not intended to replace advice given to you by your health care provider. Make sure you discuss any questions you have with your health care provider. Document Released: 10/29/2015 Document Revised: 06/21/2016 Document Reviewed: 08/03/2015 Elsevier Interactive Patient Education  2017 [place] Prevention in the Home Falls can cause injuries. They can happen to people of all ages. There are many things you can do to make your home safe and to help prevent falls. What can I do on the outside of my home? Regularly fix the edges of walkways and driveways and fix any cracks. Remove anything that might make you trip as you walk through a door, such as a raised step or threshold. Trim any bushes or trees on the path to your home. Use bright outdoor lighting. Clear any walking paths of anything that might make someone trip, such as rocks or tools. Regularly check to see if handrails are loose or broken. Make sure that both sides of any steps have handrails. Any raised decks and porches should have  guardrails on the edges. Have any leaves, snow, or ice cleared regularly. Use sand or salt on walking paths during winter. Clean up any spills in your garage right away. This includes oil or grease spills. What can I do in the bathroom? Use night lights. Install grab bars by the toilet and in the tub and shower. Do not use towel bars as grab bars. Use non-skid mats or decals in the tub or shower. If you need to sit down in the shower, use a plastic, non-slip stool. Keep the floor dry. Clean up any water that spills on the floor as soon as it happens. Remove soap buildup in the tub or shower regularly. Attach bath mats securely with double-sided non-slip rug tape. Do not have throw rugs and other things on the floor that can make you trip. What can I do in the bedroom? Use night lights. Make sure that you have a light by your bed that is easy to reach. Do not use any sheets or blankets that are too big for your bed. They should not hang down onto the floor. Have a firm chair that has side arms. You can use this for support while you get dressed. Do not have throw rugs and other things on the floor that can make you trip. What can I do in the kitchen? Clean up any spills right away. Avoid  walking on wet floors. Keep items that you use a lot in easy-to-reach places. If you need to reach something above you, use a strong step stool that has a grab bar. Keep electrical cords out of the way. Do not use floor polish or wax that makes floors slippery. If you must use wax, use non-skid floor wax. Do not have throw rugs and other things on the floor that can make you trip. What can I do with my stairs? Do not leave any items on the stairs. Make sure that there are handrails on both sides of the stairs and use them. Fix handrails that are broken or loose. Make sure that handrails are as long as the stairways. Check any carpeting to make sure that it is firmly attached to the stairs. Fix any carpet  that is loose or worn. Avoid having throw rugs at the top or bottom of the stairs. If you do have throw rugs, attach them to the floor with carpet tape. Make sure that you have a light switch at the top of the stairs and the bottom of the stairs. If you do not have them, ask someone to add them for you. What else can I do to help prevent falls? Wear shoes that: Do not have high heels. Have rubber bottoms. Are comfortable and fit you well. Are closed at the toe. Do not wear sandals. If you use a stepladder: Make sure that it is fully opened. Do not climb a closed stepladder. Make sure that both sides of the stepladder are locked into place. Ask someone to hold it for you, if possible. Clearly mark and make sure that you can see: Any grab bars or handrails. First and last steps. Where the edge of each step is. Use tools that help you move around (mobility aids) if they are needed. These include: Canes. Walkers. Scooters. Crutches. Turn on the lights when you go into a dark area. Replace any light bulbs as soon as they burn out. Set up your furniture so you have a clear path. Avoid moving your furniture around. If any of your floors are uneven, fix them. If there are any pets around you, be aware of where they are. Review your medicines with your doctor. Some medicines can make you feel dizzy. This can increase your chance of falling. Ask your doctor what other things that you can do to help prevent falls. This information is not intended to replace advice given to you by your health care provider. Make sure you discuss any questions you have with your health care provider. Document Released: 07/29/2009 Document Revised: 03/09/2016 Document Reviewed: 11/06/2014 Elsevier Interactive Patient Education  2017 [employer]  Managing Pain Without Opioids Opioids are strong medicines used to treat moderate to severe pain. For some people, especially those who have long-term (chronic) pain,  opioids may not be the best choice for pain management due to: Side effects like nausea, constipation, and sleepiness. The risk of addiction (opioid use disorder). The longer you take opioids, the greater your risk of addiction. Pain that lasts for more than 3 months is called chronic pain. Managing chronic pain usually requires more than one approach and is often provided by a team of health care providers working together (multidisciplinary approach). Pain management may be done at a pain management center or pain clinic. How to manage pain without the use of opioids Use non-opioid medicines Non-opioid medicines for pain may include: Over-the-counter or prescription non-steroidal anti-inflammatory drugs (NSAIDs). These may  be the first medicines used for pain. They work well for muscle and bone pain, and they reduce swelling. Acetaminophen. This over-the-counter medicine may work well for milder pain but not swelling. Antidepressants. These may be used to treat chronic pain. A certain type of antidepressant (tricyclics) is often used. These medicines are given in lower doses for pain than when used for depression. Anticonvulsants. These are usually used to treat seizures but may also reduce nerve (neuropathic) pain. Muscle relaxants. These relieve pain caused by sudden muscle tightening (spasms). You may also use a pain medicine that is applied to the skin as a patch, cream, or gel (topical analgesic), such as a numbing medicine. These may cause fewer side effects than medicines taken by mouth. Do certain therapies as directed Some therapies can help with pain management. They include: Physical therapy. You will do exercises to gain strength and flexibility. A physical therapist may teach you exercises to move and stretch parts of your body that are weak, stiff, or painful. You can learn these exercises at physical therapy visits and practice them at home. Physical therapy may also  involve: Massage. Heat wraps or applying heat or cold to affected areas. Electrical signals that interrupt pain signals (transcutaneous electrical nerve stimulation, TENS). Weak lasers that reduce pain and swelling (low-level laser therapy). Signals from your body that help you learn to regulate pain (biofeedback). Occupational therapy. This helps you to learn ways to function at home and work with less pain. Recreational therapy. This involves trying new activities or hobbies, such as a physical activity or drawing. Mental health therapy, including: Cognitive behavioral therapy (CBT). This helps you learn coping skills for dealing with pain. Acceptance and commitment therapy (ACT) to change the way you think and react to pain. Relaxation therapies, including muscle relaxation exercises and mindfulness-based stress reduction. Pain management counseling. This may be individual, family, or group counseling.  Receive medical treatments Medical treatments for pain management include: Nerve block injections. These may include a pain blocker and anti-inflammatory medicines. You may have injections: Near the spine to relieve chronic back or neck pain. Into joints to relieve back or joint pain. Into nerve areas that supply a painful area to relieve body pain. Into muscles (trigger point injections) to relieve some painful muscle conditions. A medical device placed near your spine to help block pain signals and relieve nerve pain or chronic back pain (spinal cord stimulation device). Acupuncture. Follow these instructions at home Medicines Take over-the-counter and prescription medicines only as told by your health care provider. If you are taking pain medicine, ask your health care providers about possible side effects to watch out for. Do not drive or use heavy machinery while taking prescription opioid pain medicine. Lifestyle  Do not use drugs or alcohol to reduce pain. If you drink alcohol,  limit how much you have to: 0-1 drink a day for women who are not pregnant. 0-2 drinks a day for men. Know how much alcohol is in a drink. In the [place] one drink equals one 12 oz bottle of beer (355 mL), one 5 oz glass of wine (148 mL), or one 1 oz glass of hard liquor (44 mL). Do not use any products that contain nicotine or tobacco. These products include cigarettes, chewing tobacco, and vaping devices, such as e-cigarettes. If you need help quitting, ask your health care provider. Eat a healthy diet and maintain a healthy weight. Poor diet and excess weight may make pain worse. Eat foods that are high  in fiber. These include fresh fruits and vegetables, whole grains, and beans. Limit foods that are high in fat and processed sugars, such as fried and sweet foods. Exercise regularly. Exercise lowers stress and may help relieve pain. Ask your health care provider what activities and exercises are safe for you. If your health care provider approves, join an exercise class that combines movement and stress reduction. Examples include yoga and tai chi. Get enough sleep. Lack of sleep may make pain worse. Lower stress as much as possible. Practice stress reduction techniques as told by your therapist. General instructions Work with all your pain management providers to find the treatments that work best for you. You are an important member of your pain management team. There are many things you can do to reduce pain on your own. Consider joining an online or in-person support group for people who have chronic pain. Keep all follow-up visits. This is important. Where to find more information You can find more information about managing pain without opioids from: American Academy of Pain Medicine: painmed.[place] for Chronic Pain: instituteforchronicpain.org American Chronic Pain Association: theacpa.org Contact a health care provider if: You have side effects from pain medicine. Your pain  gets worse or does not get better with treatments or home therapy. You are struggling with anxiety or depression. Summary Many types of pain can be managed without opioids. Chronic pain may respond better to pain management without opioids. Pain is best managed when you and a team of health care providers work together. Pain management without opioids may include non-opioid medicines, medical treatments, physical therapy, mental health therapy, and lifestyle changes. Tell your health care providers if your pain gets worse or is not being managed well enough. This information is not intended to replace advice given to you by your health care provider. Make sure you discuss any questions you have with your health care provider. Document Revised: 01/12/2021 Document Reviewed: 01/12/2021 Elsevier Patient Education  [place].

## 2021-07-09 NOTE — Progress Notes (Signed)
Subjective:   Beverly Erickson is a 50 y.o. female who presents for an Initial Medicare Annual Wellness Visit.  Virtual Visit via Telephone Note  I connected with  Beverly Erickson on 07/09/21 at  9:20 AM EDT by telephone and verified that I am speaking with the correct person using two identifiers.  Location: Patient: Home Provider: [facility] Persons participating in the virtual visit: patient/Nurse Health Advisor   I discussed the limitations, risks, security and privacy concerns of performing an evaluation and management service by telephone and the availability of in person appointments. The patient expressed understanding and agreed to proceed.  Interactive audio and video telecommunications were attempted between this nurse and patient, however failed, due to patient having technical difficulties OR patient did not have access to video capability.  We continued and completed visit with audio only.  Some vital signs may be absent or patient reported.   Abie Cheek E Elle Vezina, LPN   Review of Systems     Cardiac Risk Factors include: sedentary lifestyle;obesity (BMI >30kg/m2);Other (see comment);smoking/ tobacco exposure, Risk factor comments: hx of TIA     Objective:    Today's Vitals   07/09/21 0922  Weight: 230 lb (104.3 kg)  Height: 5\' 7"  (1.702 m)   Body mass index is 36.02 kg/m.  Advanced Directives 07/09/2021  Does Patient Have a Medical Advance Directive? No  Would patient like information on creating a medical advance directive? No - Patient declined    Current Medications (verified) Outpatient Encounter Medications as of 07/09/2021  Medication Sig   albuterol (PROVENTIL HFA;VENTOLIN HFA) 108 (90 Base) MCG/ACT inhaler Inhale 2 puffs into the lungs every 6 (six) hours as needed for wheezing or shortness of breath.   albuterol (VENTOLIN HFA) 108 (90 Base) MCG/ACT inhaler Inhale 2 puffs into the lungs every 6 (six) hours as needed for wheezing or shortness of breath.   aspirin 81  MG tablet Take 81 mg by mouth daily.   HYDROcodone-acetaminophen (NORCO) 7.5-325 MG tablet Take 1 tablet by mouth 3 (three) times daily as needed for moderate pain.   KRILL OIL PO Take 1 capsule by mouth daily.   omeprazole (PRILOSEC) 40 MG capsule Take 1 capsule (40 mg total) by mouth 2 (two) times daily.   Potassium 75 MG TABS Take 1 each by mouth at bedtime.   pseudoephedrine-acetaminophen (TYLENOL SINUS) 30-500 MG TABS tablet Take 1 tablet by mouth every 4 (four) hours as needed.   benzonatate (TESSALON) 100 MG capsule Take 1 capsule (100 mg total) by mouth 2 (two) times daily as needed for cough. (Patient not taking: No sig reported)   buPROPion (WELLBUTRIN) 100 MG tablet Take 1 tablet (100 mg total) by mouth 3 (three) times daily. (Patient not taking: Reported on 07/09/2021)   carisoprodol (SOMA) 350 MG tablet Take 1 tablet (350 mg total) by mouth 2 (two) times daily as needed for muscle spasms. (Patient not taking: Reported on 07/09/2021)   cetirizine (ZYRTEC) 10 MG tablet Take 1 tablet (10 mg total) by mouth daily. (Patient not taking: Reported on 07/09/2021)   doxycycline (VIBRAMYCIN) 100 MG capsule Take 1 capsule (100 mg total) by mouth 2 (two) times daily. (Patient not taking: Reported on 07/09/2021)   fluticasone (FLONASE) 50 MCG/ACT nasal spray Place 2 sprays into both nostrils daily. (Patient not taking: Reported on 07/09/2021)   Misc. Devices (ROLLATOR) MISC Patient with severe pain and weakness in left leg Reflex Sympathetic Dystrophy (Patient not taking: Reported on 07/09/2021)   nitroGLYCERIN (NITROSTAT) 0.4 MG  SL tablet Place 1 tablet (0.4 mg total) under the tongue every 5 (five) minutes as needed for chest pain.   pramipexole (MIRAPEX) 1 MG tablet Take 1 tablet (1 mg total) by mouth at bedtime. (Patient not taking: Reported on 07/09/2021)   No facility-administered encounter medications on file as of 07/09/2021.    Allergies (verified) Codeine and Mucinex [guaifenesin er]    History: Past Medical History:  Diagnosis Date   Anemia    Chicken pox    Depression    Depression with anxiety 08/30/2015   GERD (gastroesophageal reflux disease)    Kidney stone 02/13/2014   Right, small   Neck pain, musculoskeletal 11/17/2016   Obesity    Other and unspecified hyperlipidemia 01/14/2014   Otitis media 11/17/2016   Preventative health care 07/05/2014   RSD (reflex sympathetic dystrophy)    Tinea corporis 06/10/2017   Tobacco use disorder 01/14/2014   1ppd   Unspecified constipation 05/25/2014   Urinary frequency 03/06/2017   Past Surgical History:  Procedure Laterality Date   CESAREAN SECTION  2000   plate and screws in left arm  2005   humerus   WISDOM TOOTH EXTRACTION  50 yrs old   Family History  Problem Relation Age of Onset   Arthritis Mother        Living   Hypertension Mother    Thrombocytopenia Mother    Hyperlipidemia Father    Hypertension Father    Thrombocytopenia Maternal Grandmother    Stroke Maternal Grandmother    Hypertension Maternal Grandmother    Alcohol abuse Maternal Grandfather    Heart disease Maternal Grandfather    Parkinson's disease Maternal Grandfather    Alcohol abuse Paternal Grandfather    Cancer Paternal Grandfather    Hypothyroidism Daughter    Diabetes Daughter        type 1   Dementia Paternal Grandmother    Social History   Socioeconomic History   Marital status: Married    Spouse name: Not on file   Number of children: 3   Years of education: Not on file   Highest education level: Not on file  Occupational History   Occupation: Scientist, water quality    Comment: Disability, but works at [employer] part time  Tobacco Use   Smoking status: Every Day    Packs/day: 1.00    Years: 20.00    Pack years: 20.00    Types: Cigarettes   Smokeless tobacco: Never  Substance and Sexual Activity   Alcohol use: Yes    Comment: once a year.   Drug use: No   Sexual activity: Not on file    Comment: lives with son, 69 yo  daughter, boyfriend, no dietary  Other Topics Concern   Not on file  Social History Narrative   Children live nearby   Social Determinants of Health   Financial Resource Strain: Medium Risk   Difficulty of Paying Living Expenses: Somewhat hard  Food Insecurity: Landscape architect Present   Worried About Charity fundraiser in the Last Year: Sometimes true   Arboriculturist in the Last Year: Never true  Transportation Needs: No Transportation Needs   Lack of Transportation (Medical): No   Lack of Transportation (Non-Medical): No  Physical Activity: Inactive   Days of Exercise per Week: 0 days   Minutes of Exercise per Session: 0 min  Stress: Stress Concern Present   Feeling of Stress : To some extent  Social Connections: Moderately Isolated   Frequency of  Communication with Friends and Family: More than three times a week   Frequency of Social Gatherings with Friends and Family: More than three times a week   Attends Religious Services: Never   Marine scientist or Organizations: No   Attends Music therapist: Never   Marital Status: Married    Tobacco Counseling Ready to quit: No Counseling given: Not Answered   Clinical Intake:  Pre-visit preparation completed: Yes  Pain : No/denies pain     BMI - recorded: 36.02 Nutritional Status: BMI > 30  Obese Nutritional Risks: None Diabetes: No  How often do you need to have someone help you when you read instructions, pamphlets, or other written materials from your doctor or pharmacy?: 1 - Never  Diabetic? No  Interpreter Needed?: No  Information entered by :: Beverly Meland, LPN   Activities of Daily Living In your present state of health, do you have any difficulty performing the following activities: 07/09/2021 07/13/2020  Hearing? N N  Vision? N N  Difficulty concentrating or making decisions? Y N  Walking or climbing stairs? Y Y  Dressing or bathing? N N  Doing errands, shopping? N Y  Comment -  sometimes, not all the time patient Copy and eating ? N -  Using the Toilet? N -  In the past six months, have you accidently leaked urine? N -  Do you have problems with loss of bowel control? N -  Managing your Medications? N -  Managing your Finances? Y -  Housekeeping or managing your Housekeeping? Y -  Some recent data might be hidden    Patient Care Team: Mosie Lukes, MD as PCP - General (Family Medicine)  Indicate any recent Medical Services you may have received from other than [facility] providers in the past year (date may be approximate).     Assessment:   This is a routine wellness examination for Beverly Erickson.  Hearing/Vision screen Hearing Screening - Comments:: Denies hearing difficulties  Vision Screening - Comments:: Is supposed to wear glasses, but hasn't seen an eye doctor in years.  Dietary issues and exercise activities discussed: Current Exercise Habits: The patient does not participate in regular exercise at present, Exercise limited by: orthopedic condition(s)   Goals Addressed             This Visit's Progress    Exercise 3x per week (30 min per time)         Depression Screen [facility] 2/9 Scores 07/09/2021 03/07/2021 07/13/2020 08/11/2016  PHQ - 2 Score 3 3 1  0  PHQ- 9 Score 14 11 4  -    Fall Risk Fall Risk  07/09/2021 03/07/2021  Falls in the past year? 0 0  Number falls in past yr: 0 0  Injury with Fall? 0 0  Risk for fall due to : Medication side effect;Orthopedic patient -  Follow up Falls prevention discussed -    FALL RISK PREVENTION PERTAINING TO THE HOME:  Any stairs in or around the home? No  If so, are there any without handrails? No  Home free of loose throw rugs in walkways, pet beds, electrical cords, etc? Yes  Adequate lighting in your home to reduce risk of falls? Yes   ASSISTIVE DEVICES UTILIZED TO PREVENT FALLS:  Life alert? No  Use of a cane, walker or w/c? No  Grab bars in the bathroom? Yes  Shower chair or  bench in shower? No  Elevated toilet seat or a  handicapped toilet? Yes   TIMED UP AND GO:  Was the test performed? No . Telephonic visit  Cognitive Function:     6CIT Screen 07/09/2021  What Year? 0 points  What month? 0 points  What time? 0 points  Count back from 20 0 points  Months in reverse 0 points  Repeat phrase 6 points  Total Score 6    Immunizations Immunization History  Administered Date(s) Administered   Td 10/17/2003   Tdap 02/18/2016    TDAP status: Up to date  Flu Vaccine status: Declined, Education has been provided regarding the importance of this vaccine but patient still declined. Advised may receive this vaccine at local pharmacy or Health Dept. Aware to provide a copy of the vaccination record if obtained from local pharmacy or Health Dept. Verbalized acceptance and understanding.  Pneumococcal vaccine status: Declined,  Education has been provided regarding the importance of this vaccine but patient still declined. Advised may receive this vaccine at local pharmacy or Health Dept. Aware to provide a copy of the vaccination record if obtained from local pharmacy or Health Dept. Verbalized acceptance and understanding.   Covid-19 vaccine status: Declined, Education has been provided regarding the importance of this vaccine but patient still declined. Advised may receive this vaccine at local pharmacy or Health Dept.or vaccine clinic. Aware to provide a copy of the vaccination record if obtained from local pharmacy or Health Dept. Verbalized acceptance and understanding.  Qualifies for Shingles Vaccine? No      Screening Tests Health Maintenance  Topic Date Due   COVID-19 Vaccine (1) Never done   Hepatitis C Screening  Never done   PAP SMEAR-Modifier  01/17/2017   COLONOSCOPY (Pts 45-47yrs Insurance coverage will need to be confirmed)  Never done   INFLUENZA VACCINE  Never done   TETANUS/TDAP  02/17/2026   HIV Screening  Addressed   HPV VACCINES  Aged  Out    Health Maintenance  Health Maintenance Due  Topic Date Due   COVID-19 Vaccine (1) Never done   Hepatitis C Screening  Never done   PAP SMEAR-Modifier  01/17/2017   COLONOSCOPY (Pts 45-46yrs Insurance coverage will need to be confirmed)  Never done   INFLUENZA VACCINE  Never done   Mammogram status: Ordered 07/09/2021. Pt provided with contact info and advised to call to schedule appt.   Lung Cancer Screening: (Low Dose CT Chest recommended if Age 79-80 years, 30 pack-year currently smoking OR have quit w/in 15years.) does not qualify. Informed patient that she will qualify next year  Additional Screening:  Hepatitis C Screening: does not qualify  Vision Screening: Recommended annual ophthalmology exams for early detection of glaucoma and other disorders of the eye. Is the patient up to date with their annual eye exam?  No  Who is the provider or what is the name of the office in which the patient attends annual eye exams? None If pt is not established with a provider, would they like to be referred to a provider to establish care? No .   Dental Screening: Recommended annual dental exams for proper oral hygiene  Community Resource Referral / Chronic Care Management: CRR required this visit?  No   CCM required this visit?  No      Plan:     I have personally reviewed and noted the following in the patient's chart:   Medical and social history Use of alcohol, tobacco or illicit drugs  Current medications and supplements including opioid prescriptions. Patient  is currently taking opioid prescriptions. Information provided to patient regarding non-opioid alternatives. Patient advised to discuss non-opioid treatment plan with their provider. Functional ability and status Nutritional status Physical activity Advanced directives List of other physicians Hospitalizations, surgeries, and ER visits in previous 12 months Vitals Screenings to include cognitive, depression,  and falls Referrals and appointments  In addition, I have reviewed and discussed with patient certain preventive protocols, quality metrics, and best practice recommendations. A written personalized care plan for preventive services as well as general preventive health recommendations were provided to patient.     Sandrea Hammond, LPN   1/76/1607   Nurse Notes: CRR for assistance with paying for food and utilities as well as some medications. Patient is very depressed - hasn't been taking her medications as prescribed. 6CIT score = 6 - she says she cannot focus at work, forgets after one minute what customer said

## 2021-07-18 ENCOUNTER — Encounter: Payer: Self-pay | Admitting: *Deleted

## 2021-07-18 ENCOUNTER — Other Ambulatory Visit (HOSPITAL_BASED_OUTPATIENT_CLINIC_OR_DEPARTMENT_OTHER): Payer: Self-pay

## 2021-07-18 ENCOUNTER — Ambulatory Visit (INDEPENDENT_AMBULATORY_CARE_PROVIDER_SITE_OTHER): Payer: Medicare Other | Admitting: Family Medicine

## 2021-07-18 ENCOUNTER — Encounter: Payer: Self-pay | Admitting: Family Medicine

## 2021-07-18 ENCOUNTER — Other Ambulatory Visit: Payer: Self-pay

## 2021-07-18 VITALS — BP 124/72 | HR 81 | Temp 98.2°F | Resp 16 | Wt 235.6 lb

## 2021-07-18 DIAGNOSIS — M549 Dorsalgia, unspecified: Secondary | ICD-10-CM

## 2021-07-18 DIAGNOSIS — R110 Nausea: Secondary | ICD-10-CM | POA: Diagnosis not present

## 2021-07-18 DIAGNOSIS — Z79899 Other long term (current) drug therapy: Secondary | ICD-10-CM | POA: Diagnosis not present

## 2021-07-18 DIAGNOSIS — Z1159 Encounter for screening for other viral diseases: Secondary | ICD-10-CM

## 2021-07-18 DIAGNOSIS — K5900 Constipation, unspecified: Secondary | ICD-10-CM | POA: Diagnosis not present

## 2021-07-18 DIAGNOSIS — M545 Low back pain, unspecified: Secondary | ICD-10-CM | POA: Diagnosis not present

## 2021-07-18 DIAGNOSIS — F418 Other specified anxiety disorders: Secondary | ICD-10-CM

## 2021-07-18 DIAGNOSIS — Z79891 Long term (current) use of opiate analgesic: Secondary | ICD-10-CM

## 2021-07-18 DIAGNOSIS — E782 Mixed hyperlipidemia: Secondary | ICD-10-CM

## 2021-07-18 DIAGNOSIS — R5383 Other fatigue: Secondary | ICD-10-CM | POA: Diagnosis not present

## 2021-07-18 DIAGNOSIS — R350 Frequency of micturition: Secondary | ICD-10-CM | POA: Diagnosis not present

## 2021-07-18 DIAGNOSIS — K219 Gastro-esophageal reflux disease without esophagitis: Secondary | ICD-10-CM

## 2021-07-18 DIAGNOSIS — G9050 Complex regional pain syndrome I, unspecified: Secondary | ICD-10-CM

## 2021-07-18 DIAGNOSIS — R739 Hyperglycemia, unspecified: Secondary | ICD-10-CM

## 2021-07-18 LAB — URINALYSIS
Bilirubin Urine: NEGATIVE
Ketones, ur: NEGATIVE
Leukocytes,Ua: NEGATIVE
Nitrite: NEGATIVE
Specific Gravity, Urine: 1.025 (ref 1.000–1.030)
Total Protein, Urine: NEGATIVE
Urine Glucose: NEGATIVE
Urobilinogen, UA: 0.2 (ref 0.0–1.0)
pH: 6 (ref 5.0–8.0)

## 2021-07-18 LAB — LIPID PANEL
Cholesterol: 177 mg/dL (ref 0–200)
HDL: 48.8 mg/dL (ref >39.00)
LDL Cholesterol: 107 mg/dL — ABNORMAL HIGH (ref 0–99)
NonHDL: 128.69
Total CHOL/HDL Ratio: 4
Triglycerides: 108 mg/dL (ref 0.0–149.0)
VLDL: 21.6 mg/dL (ref 0.0–40.0)

## 2021-07-18 LAB — COMPREHENSIVE METABOLIC PANEL
ALT: 17 U/L (ref 0–35)
AST: 15 U/L (ref 0–37)
Albumin: 3.9 g/dL (ref 3.5–5.2)
Alkaline Phosphatase: 61 U/L (ref 39–117)
BUN: 17 mg/dL (ref 6–23)
CO2: 24 mEq/L (ref 19–32)
Calcium: 9 mg/dL (ref 8.4–10.5)
Chloride: 109 mEq/L (ref 96–112)
Creatinine, Ser: 0.98 mg/dL (ref 0.40–1.20)
GFR: 67.85 mL/min (ref >60.00)
Glucose, Bld: 78 mg/dL (ref 70–99)
Potassium: 4.1 mEq/L (ref 3.5–5.1)
Sodium: 142 mEq/L (ref 135–145)
Total Bilirubin: 0.3 mg/dL (ref 0.2–1.2)
Total Protein: 6.4 g/dL (ref 6.0–8.3)

## 2021-07-18 LAB — CBC
HCT: 40.8 % (ref 36.0–46.0)
Hemoglobin: 13.8 g/dL (ref 12.0–15.0)
MCHC: 33.8 g/dL (ref 30.0–36.0)
MCV: 92.4 fl (ref 78.0–100.0)
Platelets: 246 10*3/uL (ref 150.0–400.0)
RBC: 4.41 Mil/uL (ref 3.87–5.11)
RDW: 13.2 % (ref 11.5–15.5)
WBC: 7.2 10*3/uL (ref 4.0–10.5)

## 2021-07-18 LAB — TSH: TSH: 1.07 u[IU]/mL (ref 0.35–5.50)

## 2021-07-18 LAB — HEMOGLOBIN A1C: Hgb A1c MFr Bld: 5.3 % (ref 4.6–6.5)

## 2021-07-18 MED ORDER — HYDROCODONE-ACETAMINOPHEN 7.5-325 MG PO TABS
1.0000 | ORAL_TABLET | Freq: Three times a day (TID) | ORAL | 0 refills | Status: DC | PRN
  Filled 2021-07-18 – 2021-07-21 (×3): qty 90, 30d supply, fill #0

## 2021-07-18 NOTE — Assessment & Plan Note (Signed)
Encouraged moist heat and gentle stretching as tolerated. May try NSAIDs and prescription meds as directed and report if symptoms worsen or seek immediate care. Check UA

## 2021-07-18 NOTE — Patient Instructions (Signed)
[place] for vitamins  Multivitamin with minerals daily Fatty acids either fish or krill or Flaxseed oil Vitamin D 1000 IU daily   Paxlovid and Molnupiravir the new COVID medication we can give you if you get COVID so make sure you test if you have symptoms because we have to treat by day 5 of symptoms for it to be effective. If you are positive let us know so we can treat. If a home test is negative and your symptoms are persistent get a PCR test. Can check testing locations at [email or web address] If you are positive we will make an appointment with [place] and we will send in Paxlovid  or Molnupiravir if you would like it. Check with your pharmacy before we meet to confirm they have it in stock, if they do not then we can get the prescription at the [facility]

## 2021-07-18 NOTE — Assessment & Plan Note (Signed)
She is under a great deal of stress but she feels she is managing it adequately and declines changes

## 2021-07-18 NOTE — Assessment & Plan Note (Signed)
Continues to struggle with daily pain but is trying to stay as active as able. UDS updated. No change in meds, refills given

## 2021-07-18 NOTE — Assessment & Plan Note (Signed)
With some dysuria. Check UA with culture

## 2021-07-18 NOTE — Assessment & Plan Note (Signed)
Encourage heart healthy diet such as MIND or DASH diet, increase exercise, avoid trans fats, simple carbohydrates and processed foods, consider a krill or fish or flaxseed oil cap daily.  °

## 2021-07-18 NOTE — Assessment & Plan Note (Signed)
hgba1c acceptable, minimize simple carbs. Increase exercise as tolerated.  

## 2021-07-18 NOTE — Assessment & Plan Note (Signed)
Avoid offending foods, start probiotics. Do not eat large meals in late evening and consider raising head of bed.  

## 2021-07-18 NOTE — Assessment & Plan Note (Signed)
Likely multifactorial check labs increase exercise. Cut out processed foods and simple carbs, sleep 8 hours and hydrate well.

## 2021-07-18 NOTE — Progress Notes (Signed)
Subjective:   By signing my name below, I, [name], attest that this documentation has been prepared under the direction and in the presence of Mosie Lukes, MD. 07/18/2021     Patient ID: Beverly Erickson, female    DOB: [birth year], 50 y.o.   MRN: [id number]  No chief complaint on file.   HPI Patient is in today for an office visit.  She has been experiencing urine frequency, urgency and dysuria in her lower back for the past 2 weeks. She denies fevers,chills, and vomiting.  She reports that she has episodes of dizziness and nausea accompanied by hunger in the mornings. She has also been feeling exteremly tired lately for the past couple of months.  She has been experiencing hot flashes and brain fog which may be due to menopause. Her menstrual cycles have also been inconsistent.  She is requesting a refill on hydrocodone-acetaminophen 7.5 mg - 325 mg.   She is willing to get the Hepatitis C screening today.  Past Medical History:  Diagnosis Date   Anemia    Chicken pox    Depression    Depression with anxiety 08/30/2015   GERD (gastroesophageal reflux disease)    Kidney stone 02/13/2014   Right, small   Neck pain, musculoskeletal 11/17/2016   Obesity    Other and unspecified hyperlipidemia 01/14/2014   Otitis media 11/17/2016   Preventative health care 07/05/2014   RSD (reflex sympathetic dystrophy)    Tinea corporis 06/10/2017   Tobacco use disorder 01/14/2014   1ppd   Unspecified constipation 05/25/2014   Urinary frequency 03/06/2017    Past Surgical History:  Procedure Laterality Date   CESAREAN SECTION  2000   plate and screws in left arm  2005   humerus   WISDOM TOOTH EXTRACTION  50 yrs old    Family History  Problem Relation Age of Onset   Arthritis Mother        Living   Hypertension Mother    Thrombocytopenia Mother    Hyperlipidemia Father    Hypertension Father    Thrombocytopenia Maternal Grandmother    Stroke Maternal Grandmother    Hypertension  Maternal Grandmother    Alcohol abuse Maternal Grandfather    Heart disease Maternal Grandfather    Parkinson's disease Maternal Grandfather    Alcohol abuse Paternal Grandfather    Cancer Paternal Grandfather    Hypothyroidism Daughter    Diabetes Daughter        type 1   Dementia Paternal Grandmother     Social History   Socioeconomic History   Marital status: Married    Spouse name: Not on file   Number of children: 3   Years of education: Not on file   Highest education level: Not on file  Occupational History   Occupation: Scientist, water quality    Comment: Disability, but works at [employer] part time  Tobacco Use   Smoking status: Every Day    Packs/day: 1.00    Years: 20.00    Pack years: 20.00    Types: Cigarettes   Smokeless tobacco: Never  Substance and Sexual Activity   Alcohol use: Yes    Comment: once a year.   Drug use: No   Sexual activity: Not on file    Comment: lives with son, 77 yo daughter, boyfriend, no dietary  Other Topics Concern   Not on file  Social History Narrative   Children live nearby   Social Determinants of Health   Financial Resource Strain:  Medium Risk   Difficulty of Paying Living Expenses: Somewhat hard  Food Insecurity: Food Insecurity Present   Worried About Running Out of Food in the Last Year: Sometimes true   Ran Out of Food in the Last Year: Never true  Transportation Needs: No Transportation Needs   Lack of Transportation (Medical): No   Lack of Transportation (Non-Medical): No  Physical Activity: Inactive   Days of Exercise per Week: 0 days   Minutes of Exercise per Session: 0 min  Stress: Stress Concern Present   Feeling of Stress : To some extent  Social Connections: Moderately Isolated   Frequency of Communication with Friends and Family: More than three times a week   Frequency of Social Gatherings with Friends and Family: More than three times a week   Attends Religious Services: Never   Marine scientist or  Organizations: No   Attends Music therapist: Never   Marital Status: Married  Human resources officer Violence: Not At Risk   Fear of Current or Ex-Partner: No   Emotionally Abused: No   Physically Abused: No   Sexually Abused: No    Outpatient Medications Prior to Visit  Medication Sig Dispense Refill   albuterol (PROVENTIL HFA;VENTOLIN HFA) 108 (90 Base) MCG/ACT inhaler Inhale 2 puffs into the lungs every 6 (six) hours as needed for wheezing or shortness of breath. 2 Inhaler 2   albuterol (VENTOLIN HFA) 108 (90 Base) MCG/ACT inhaler Inhale 2 puffs into the lungs every 6 (six) hours as needed for wheezing or shortness of breath. 18 g 2   aspirin 81 MG tablet Take 81 mg by mouth daily.     KRILL OIL PO Take 1 capsule by mouth daily.     nitroGLYCERIN (NITROSTAT) 0.4 MG SL tablet Place 1 tablet (0.4 mg total) under the tongue every 5 (five) minutes as needed for chest pain. 25 tablet 1   omeprazole (PRILOSEC) 40 MG capsule Take 1 capsule (40 mg total) by mouth 2 (two) times daily. 180 capsule 1   Potassium 75 MG TABS Take 1 each by mouth at bedtime.     pramipexole (MIRAPEX) 1 MG tablet Take 1 tablet (1 mg total) by mouth at bedtime. 30 tablet 2   pseudoephedrine-acetaminophen (TYLENOL SINUS) 30-500 MG TABS tablet Take 1 tablet by mouth every 4 (four) hours as needed.     benzonatate (TESSALON) 100 MG capsule Take 1 capsule (100 mg total) by mouth 2 (two) times daily as needed for cough. 20 capsule 0   HYDROcodone-acetaminophen (NORCO) 7.5-325 MG tablet Take 1 tablet by mouth 3 (three) times daily as needed for moderate pain. 90 tablet 0   buPROPion (WELLBUTRIN) 100 MG tablet Take 1 tablet (100 mg total) by mouth 3 (three) times daily. (Patient not taking: No sig reported) 90 tablet 2   carisoprodol (SOMA) 350 MG tablet Take 1 tablet (350 mg total) by mouth 2 (two) times daily as needed for muscle spasms. (Patient not taking: No sig reported) 60 tablet 01   cetirizine (ZYRTEC) 10 MG  tablet Take 1 tablet (10 mg total) by mouth daily. (Patient not taking: No sig reported) 30 tablet 11   doxycycline (VIBRAMYCIN) 100 MG capsule Take 1 capsule (100 mg total) by mouth 2 (two) times daily. (Patient not taking: No sig reported) 20 capsule 0   fluticasone (FLONASE) 50 MCG/ACT nasal spray Place 2 sprays into both nostrils daily. (Patient not taking: No sig reported) 16 g 6   Misc. Devices (ROLLATOR) MISC  Patient with severe pain and weakness in left leg Reflex Sympathetic Dystrophy (Patient not taking: No sig reported) 1 each 0   No facility-administered medications prior to visit.    Allergies  Allergen Reactions   Codeine Nausea Only   Mucinex [Guaifenesin Er] Other (See Comments)    Nightmares    Review of Systems  Constitutional:  Positive for malaise/fatigue. Negative for fever.  HENT:  Negative for congestion.   Eyes:  Negative for redness.  Respiratory:  Negative for shortness of breath.   Cardiovascular:  Negative for chest pain, palpitations and leg swelling.  Gastrointestinal:  Negative for abdominal pain, blood in stool and nausea.  Genitourinary:  Positive for dysuria, frequency and urgency.  Musculoskeletal:  Negative for falls.  Skin:  Negative for rash.  Neurological:  Positive for dizziness. Negative for loss of consciousness and headaches.  Endo/Heme/Allergies:  Negative for polydipsia.  Psychiatric/Behavioral:  Negative for depression. The patient is not nervous/anxious.       Objective:    Physical Exam Constitutional:      General: She is not in acute distress.    Appearance: She is well-developed.  HENT:     Head: Normocephalic and atraumatic.  Eyes:     Conjunctiva/sclera: Conjunctivae normal.  Neck:     Thyroid: No thyromegaly.  Cardiovascular:     Rate and Rhythm: Normal rate and regular rhythm.     Heart sounds: Normal heart sounds. No murmur heard. Pulmonary:     Effort: Pulmonary effort is normal. No respiratory distress.      Breath sounds: Normal breath sounds.  Abdominal:     General: Bowel sounds are normal. There is no distension.     Palpations: Abdomen is soft. There is no mass.     Tenderness: There is no abdominal tenderness.  Musculoskeletal:     Cervical back: Neck supple.  Lymphadenopathy:     Cervical: No cervical adenopathy.  Skin:    General: Skin is warm and dry.  Neurological:     Mental Status: She is alert and oriented to person, place, and time.  Psychiatric:        Behavior: Behavior normal.    BP 124/72   Pulse 81   Temp 98.2 F (36.8 C)   Resp 16   Wt 235 lb 9.6 oz (106.9 kg)   SpO2 97%   BMI 36.90 kg/m  Wt Readings from Last 3 Encounters:  07/18/21 235 lb 9.6 oz (106.9 kg)  07/09/21 230 lb (104.3 kg)  03/07/21 223 lb (101.2 kg)    Diabetic Foot Exam - Simple   No data filed    Lab Results  Component Value Date   WBC 6.1 05/19/2019   HGB 14.1 05/19/2019   HCT 41.8 05/19/2019   PLT 225.0 05/19/2019   GLUCOSE 72 05/19/2019   CHOL 154 05/19/2019   TRIG 52.0 05/19/2019   HDL 43.00 05/19/2019   LDLCALC 101 (H) 05/19/2019   ALT 10 05/19/2019   AST 11 05/19/2019   NA 142 05/19/2019   K 4.5 05/19/2019   CL 110 05/19/2019   CREATININE 0.74 05/19/2019   BUN 22 05/19/2019   CO2 24 05/19/2019   TSH 1.08 05/19/2019   HGBA1C 5.3 05/19/2019    Lab Results  Component Value Date   TSH 1.08 05/19/2019   Lab Results  Component Value Date   WBC 6.1 05/19/2019   HGB 14.1 05/19/2019   HCT 41.8 05/19/2019   MCV 92.2 05/19/2019   PLT 225.0  05/19/2019   Lab Results  Component Value Date   NA 142 05/19/2019   K 4.5 05/19/2019   CO2 24 05/19/2019   GLUCOSE 72 05/19/2019   BUN 22 05/19/2019   CREATININE 0.74 05/19/2019   BILITOT 0.4 05/19/2019   ALKPHOS 59 05/19/2019   AST 11 05/19/2019   ALT 10 05/19/2019   PROT 6.6 05/19/2019   ALBUMIN 4.2 05/19/2019   CALCIUM 9.4 05/19/2019   GFR 84.05 05/19/2019   Lab Results  Component Value Date   CHOL 154  05/19/2019   Lab Results  Component Value Date   HDL 43.00 05/19/2019   Lab Results  Component Value Date   LDLCALC 101 (H) 05/19/2019   Lab Results  Component Value Date   TRIG 52.0 05/19/2019   Lab Results  Component Value Date   CHOLHDL 4 05/19/2019   Lab Results  Component Value Date   HGBA1C 5.3 05/19/2019       Assessment & Plan:   Problem List Items Addressed This Visit     GERD (gastroesophageal reflux disease)    Avoid offending foods, start probiotics. Do not eat large meals in late evening and consider raising head of bed.       RSD (reflex sympathetic dystrophy)    Continues to struggle with daily pain but is trying to stay as active as able. UDS updated. No change in meds, refills given      Relevant Medications   HYDROcodone-acetaminophen (NORCO) 7.5-325 MG tablet   Hyperglycemia    hgba1c acceptable, minimize simple carbs. Increase exercise as tolerated.       Relevant Orders   Comprehensive metabolic panel   TSH   Hemoglobin A1c   Hyperlipidemia, mixed    Encourage heart healthy diet such as MIND or DASH diet, increase exercise, avoid trans fats, simple carbohydrates and processed foods, consider a krill or fish or flaxseed oil cap daily.       Relevant Orders   Lipid panel   TSH   Mid back pain on right side   Relevant Medications   HYDROcodone-acetaminophen (NORCO) 7.5-325 MG tablet   Constipation   Relevant Orders   CBC   Depression with anxiety    She is under a great deal of stress but she feels she is managing it adequately and declines changes      Urinary frequency - Primary    With some dysuria. Check UA with culture      Relevant Orders   Urinalysis   Urine Culture   Low back pain    Encouraged moist heat and gentle stretching as tolerated. May try NSAIDs and prescription meds as directed and report if symptoms worsen or seek immediate care. Check UA      Relevant Medications   HYDROcodone-acetaminophen (NORCO)  7.5-325 MG tablet   Fatigue    Likely multifactorial check labs increase exercise. Cut out processed foods and simple carbs, sleep 8 hours and hydrate well.       Other Visit Diagnoses     Nausea       Encounter for hepatitis C screening test for low risk patient       Relevant Orders   Hepatitis C antibody   High risk medication use       Relevant Orders   Drug Monitoring Panel [phone] , Urine   Opioid use agreement exists       Relevant Orders   Drug Monitoring Panel [phone] , Urine  Meds ordered this encounter  Medications   HYDROcodone-acetaminophen (NORCO) 7.5-325 MG tablet    Sig: Take 1 tablet by mouth 3 (three) times daily as needed for moderate pain.    Dispense:  90 tablet    Refill:  0    I,[name],acting as a scribe for Penni Homans, MD.,have documented all relevant documentation on the behalf of Penni Homans, MD,as directed by  Penni Homans, MD while in the presence of Penni Homans, MD.   I, Mosie Lukes, MD. , personally preformed the services described in this documentation.  All medical record entries made by the scribe were at my direction and in my presence.  I have reviewed the chart and discharge instructions (if applicable) and agree that the record reflects my personal performance and is accurate and complete. 07/18/2021

## 2021-07-19 ENCOUNTER — Encounter: Payer: Self-pay | Admitting: Family Medicine

## 2021-07-19 LAB — HEPATITIS C ANTIBODY
Hepatitis C Ab: NONREACTIVE
SIGNAL TO CUT-OFF: 0.01 (ref <1.00)

## 2021-07-19 LAB — URINE CULTURE
MICRO NUMBER:: 12451988
SPECIMEN QUALITY:: ADEQUATE

## 2021-07-20 ENCOUNTER — Other Ambulatory Visit: Payer: Self-pay | Admitting: Family Medicine

## 2021-07-20 ENCOUNTER — Telehealth: Payer: Self-pay

## 2021-07-20 ENCOUNTER — Telehealth (HOSPITAL_BASED_OUTPATIENT_CLINIC_OR_DEPARTMENT_OTHER): Payer: Self-pay

## 2021-07-20 LAB — DRUG MONITORING PANEL 376104, URINE
Amphetamines: NEGATIVE ng/mL (ref <500)
Barbiturates: NEGATIVE ng/mL (ref <300)
Benzodiazepines: NEGATIVE ng/mL (ref <100)
Cocaine Metabolite: NEGATIVE ng/mL (ref <150)
Desmethyltramadol: NEGATIVE ng/mL (ref <100)
Opiates: NEGATIVE ng/mL (ref <100)
Oxycodone: NEGATIVE ng/mL (ref <100)
Tramadol: NEGATIVE ng/mL (ref <100)

## 2021-07-20 LAB — DM TEMPLATE

## 2021-07-20 MED ORDER — NITROFURANTOIN MONOHYD MACRO 100 MG PO CAPS
100.0000 mg | ORAL_CAPSULE | Freq: Two times a day (BID) | ORAL | 0 refills | Status: DC
  Filled 2021-07-20: qty 10, 5d supply, fill #0

## 2021-07-20 NOTE — Telephone Encounter (Addendum)
   Telephone encounter was:  Unsuccessful.  07/20/2021 Name: Beverly Erickson MRN: [id number] DOB: May 24, 1971  Unsuccessful outbound call made today to assist with:   utilities and medication. Letter saved in Epic.  Outreach Attempt:  1st Attempt  A HIPAA compliant voice message was left requesting a return call.  Instructed patient to call back at [phone].  Sunset Joshi, AAS Paralegal, [place] [facility]  [address], [place] [address] ??[email or web address]  ?? [phone]   [email or web address]

## 2021-07-21 ENCOUNTER — Telehealth: Payer: Self-pay | Admitting: Family Medicine

## 2021-07-21 ENCOUNTER — Other Ambulatory Visit (HOSPITAL_BASED_OUTPATIENT_CLINIC_OR_DEPARTMENT_OTHER): Payer: Self-pay

## 2021-07-21 NOTE — Telephone Encounter (Signed)
Patient is wondering why a prescription for nitrofurantoin, macrocrystal-monohydrate, (MACROBID) 100 MG capsule was sent to her pharmacy. She states that she thought she didn't have an UTI so she is not sure why she got this medicine. Please advice.

## 2021-07-22 NOTE — Telephone Encounter (Signed)
Lvm details on vm

## 2021-07-28 ENCOUNTER — Other Ambulatory Visit (HOSPITAL_BASED_OUTPATIENT_CLINIC_OR_DEPARTMENT_OTHER): Payer: Self-pay

## 2021-07-28 ENCOUNTER — Ambulatory Visit (INDEPENDENT_AMBULATORY_CARE_PROVIDER_SITE_OTHER): Payer: Medicare Other | Admitting: Family Medicine

## 2021-07-28 ENCOUNTER — Other Ambulatory Visit: Payer: Self-pay

## 2021-07-28 DIAGNOSIS — R739 Hyperglycemia, unspecified: Secondary | ICD-10-CM | POA: Diagnosis not present

## 2021-07-28 DIAGNOSIS — L2489 Irritant contact dermatitis due to other agents: Secondary | ICD-10-CM

## 2021-07-28 DIAGNOSIS — K219 Gastro-esophageal reflux disease without esophagitis: Secondary | ICD-10-CM

## 2021-07-28 MED ORDER — OMEPRAZOLE 40 MG PO CPDR
40.0000 mg | DELAYED_RELEASE_CAPSULE | Freq: Two times a day (BID) | ORAL | 1 refills | Status: DC
  Filled 2021-07-28: qty 180, 90d supply, fill #0
  Filled 2022-02-04 – 2022-04-10 (×2): qty 180, 90d supply, fill #1

## 2021-07-28 MED ORDER — METHYLPREDNISOLONE 4 MG PO TABS
ORAL_TABLET | ORAL | 0 refills | Status: DC
  Filled 2021-07-28: qty 45, 15d supply, fill #0

## 2021-07-28 NOTE — Patient Instructions (Addendum)
Zyrtec (Cetirizine) and Pepcid 20 mg both twice daily  Poison Ivy Dermatitis Poison ivy dermatitis is redness and soreness of the skin caused by chemicals in the leaves of the poison ivy plant. You may have very bad itching, swelling, a rash, and blisters. What are the causes? Touching a poison ivy plant. Touching something that has the chemical on it. This may include animals or objects that have come in contact with the plant. What increases the risk? Going outdoors often in wooded or [place] areas. Going outdoors without wearing protective clothing, such as closed shoes, long pants, and a long-sleeved shirt. What are the signs or symptoms?  Skin redness. Very bad itching. A rash that often includes bumps and blisters. The rash usually appears 48 hours after exposure, if you have been exposed before. If this is the first time you have been exposed, the rash may not appear until a week after exposure. Swelling. This may occur if the reaction is very bad. Symptoms usually last for 1-2 weeks. The first time you develop this condition, symptoms may last 3-4 weeks. How is this treated? This condition may be treated with: Hydrocortisone cream or calamine lotion to relieve itching. Oatmeal baths to soothe the skin. Medicines, such as over-the-counter antihistamine tablets. Oral steroid medicine for more severe reactions. Follow these instructions at home: Medicines Take or apply over-the-counter and prescription medicines only as told by your doctor. Use hydrocortisone cream or calamine lotion as needed to help with itching. General instructions Do not scratch or rub your skin. Put a cold, wet cloth (cold compress) on the affected areas or take baths in cool water. This will help with itching. Avoid hot baths and showers. Take oatmeal baths as needed. Use colloidal oatmeal. You can get this at a pharmacy or grocery store. Follow the instructions on the package. While you have the rash,  wash your clothes right after you wear them. Keep all follow-up visits as told by your health care provider. This is important. How is this prevented?  Know what poison ivy looks like, so you can avoid it. This plant has three leaves with flowering branches on a single stem. The leaves are glossy. The leaves have uneven edges that come to a point at the front. If you touch poison ivy, wash your skin with soap and water right away. Be sure to wash under your fingernails. When hiking or camping, wear long pants, a long-sleeved shirt, tall socks, and hiking boots. You can also use a lotion on your skin that helps to prevent contact with poison ivy. If you think that your clothes or outdoor gear came in contact with poison ivy, rinse them off with a garden hose before you bring them inside your house. When doing yard work or gardening, wear gloves, long sleeves, long pants, and boots. Wash your garden tools and gloves if they come in contact with poison ivy. If you think that your pet has come into contact with poison ivy, wash him or her with pet shampoo and water. Make sure to wear gloves while washing your pet. Contact a doctor if: You have open sores in the rash area. You have more redness, swelling, or pain in the rash area. You have redness that spreads beyond the rash area. You have fluid, blood, or pus coming from the rash area. You have a fever. You have a rash over a large area of your body. You have a rash on your eyes, mouth, or genitals. Your rash does not  get better after a few weeks. Get help right away if: Your face swells or your eyes swell shut. You have trouble breathing. You have trouble swallowing. These symptoms may be an emergency. Do not wait to see if the symptoms will go away. Get medical help right away. Call your local emergency services (911 in the [place] Do not drive yourself to the hospital. Summary Poison ivy dermatitis is redness and soreness of the skin  caused by chemicals in the leaves of the poison ivy plant. You may have skin redness, very bad itching, swelling, and a rash. Do not scratch or rub your skin. Take or apply over-the-counter and prescription medicines only as told by your doctor. This information is not intended to replace advice given to you by your health care provider. Make sure you discuss any questions you have with your health care provider. Document Revised: 01/24/2019 Document Reviewed: 09/27/2018 Elsevier Patient Education  2022 [employer].

## 2021-07-28 NOTE — Progress Notes (Signed)
Patient ID: Beverly Erickson, female    DOB: [birth year]  Age: 50 y.o. MRN: [id number]    Subjective:   Chief Complaint  Patient presents with   Poison Ivy    Face, neck, arms, trunk   Subjective   HPI Beverly Erickson presents for office visit today for follow up on poison ivy exposure and GERD. Exposure in face, eyes, neck, arms, and torso. She is experiencing itchiness and swelling in exposed area. She also experienced chills, but denies having any fevers. Denies CP/palp/SOB/HA/congestion/fevers/GI or GU c/o. Taking meds as   Review of Systems  Constitutional:  Negative for chills, fatigue and fever.  HENT:  Negative for congestion, rhinorrhea, sinus pressure, sinus pain and sore throat.   Eyes:  Negative for pain.  Respiratory:  Negative for cough and shortness of breath.   Cardiovascular:  Negative for chest pain, palpitations and leg swelling.  Gastrointestinal:  Negative for abdominal pain, blood in stool, diarrhea, nausea and vomiting.  Genitourinary:  Negative for decreased urine volume, flank pain, frequency, vaginal bleeding and vaginal discharge.  Musculoskeletal:  Negative for back pain.  Allergic/Immunologic: Positive for environmental allergies (poison ivy exposure).  Neurological:  Negative for headaches.   History Past Medical History:  Diagnosis Date   Anemia    Chicken pox    Depression    Depression with anxiety 08/30/2015   GERD (gastroesophageal reflux disease)    Kidney stone 02/13/2014   Right, small   Neck pain, musculoskeletal 11/17/2016   Obesity    Other and unspecified hyperlipidemia 01/14/2014   Otitis media 11/17/2016   Preventative health care 07/05/2014   RSD (reflex sympathetic dystrophy)    Tinea corporis 06/10/2017   Tobacco use disorder 01/14/2014   1ppd   Unspecified constipation 05/25/2014   Urinary frequency 03/06/2017    She has a past surgical history that includes Cesarean section (2000); plate and screws in left arm (2005); and Wisdom tooth  extraction (50 yrs old).   Her family history includes Alcohol abuse in her maternal grandfather and paternal grandfather; Arthritis in her mother; Cancer in her paternal grandfather; Dementia in her paternal grandmother; Diabetes in her daughter; Heart disease in her maternal grandfather; Hyperlipidemia in her father; Hypertension in her father, maternal grandmother, and mother; Hypothyroidism in her daughter; Parkinson's disease in her maternal grandfather; Stroke in her maternal grandmother; Thrombocytopenia in her maternal grandmother and mother.She reports that she has been smoking cigarettes. She has a 20.00 pack-year smoking history. She has never used smokeless tobacco. She reports current alcohol use. She reports that she does not use drugs.  Current Outpatient Medications on File Prior to Visit  Medication Sig Dispense Refill   albuterol (PROVENTIL HFA;VENTOLIN HFA) 108 (90 Base) MCG/ACT inhaler Inhale 2 puffs into the lungs every 6 (six) hours as needed for wheezing or shortness of breath. 2 Inhaler 2   albuterol (VENTOLIN HFA) 108 (90 Base) MCG/ACT inhaler Inhale 2 puffs into the lungs every 6 (six) hours as needed for wheezing or shortness of breath. 18 g 2   aspirin 81 MG tablet Take 81 mg by mouth daily.     carisoprodol (SOMA) 350 MG tablet Take 1 tablet (350 mg total) by mouth 2 (two) times daily as needed for muscle spasms. 60 tablet 01   cetirizine (ZYRTEC) 10 MG tablet Take 1 tablet (10 mg total) by mouth daily. 30 tablet 11   fluticasone (FLONASE) 50 MCG/ACT nasal spray Place 2 sprays into both nostrils daily. 16 g 6   HYDROcodone-acetaminophen (  NORCO) 7.5-325 MG tablet Take 1 tablet by mouth 3 (three) times daily as needed for moderate pain. 90 tablet 0   KRILL OIL PO Take 1 capsule by mouth daily.     nitrofurantoin, macrocrystal-monohydrate, (MACROBID) 100 MG capsule Take 1 capsule (100 mg total) by mouth 2 (two) times daily. 10 capsule 0   nitroGLYCERIN (NITROSTAT) 0.4 MG SL  tablet Place 1 tablet (0.4 mg total) under the tongue every 5 (five) minutes as needed for chest pain. 25 tablet 1   Potassium 75 MG TABS Take 1 each by mouth at bedtime.     pramipexole (MIRAPEX) 1 MG tablet Take 1 tablet (1 mg total) by mouth at bedtime. 30 tablet 2   buPROPion (WELLBUTRIN) 100 MG tablet Take 1 tablet (100 mg total) by mouth 3 (three) times daily. (Patient not taking: No sig reported) 90 tablet 2   No current facility-administered medications on file prior to visit.     Objective:  Objective  Physical Exam Constitutional:      General: She is not in acute distress.    Appearance: Normal appearance. She is not ill-appearing or toxic-appearing.  HENT:     Head: Normocephalic and atraumatic.     Right Ear: Tympanic membrane, ear canal and external ear normal.     Left Ear: Tympanic membrane, ear canal and external ear normal.     Nose: No congestion or rhinorrhea.  Eyes:     Extraocular Movements: Extraocular movements intact.     Pupils: Pupils are equal, round, and reactive to light.  Cardiovascular:     Rate and Rhythm: Normal rate and regular rhythm.     Pulses: Normal pulses.     Heart sounds: Normal heart sounds. No murmur heard. Pulmonary:     Effort: Pulmonary effort is normal. No respiratory distress.     Breath sounds: Normal breath sounds. No wheezing, rhonchi or rales.  Abdominal:     General: Bowel sounds are normal.     Palpations: Abdomen is soft. There is no mass.     Tenderness: There is no abdominal tenderness. There is no guarding.     Hernia: No hernia is present.  Musculoskeletal:        General: Normal range of motion.     Cervical back: Normal range of motion and neck supple.  Skin:    General: Skin is warm and dry.  Neurological:     Mental Status: She is alert and oriented to person, place, and time.  Psychiatric:        Behavior: Behavior normal.   BP 118/68 (BP Location: Right Arm, Cuff Size: Large)   Pulse 80   Temp 98 F (36.7  C) (Oral)   Resp 12   Ht 5\' 7"  (1.702 m)   Wt 233 lb 12.8 oz (106.1 kg)   SpO2 98%   BMI 36.62 kg/m  Wt Readings from Last 3 Encounters:  07/28/21 233 lb 12.8 oz (106.1 kg)  07/18/21 235 lb 9.6 oz (106.9 kg)  07/09/21 230 lb (104.3 kg)     Lab Results  Component Value Date   WBC 7.2 07/18/2021   HGB 13.8 07/18/2021   HCT 40.8 07/18/2021   PLT 246.0 07/18/2021   GLUCOSE 78 07/18/2021   CHOL 177 07/18/2021   TRIG 108.0 07/18/2021   HDL 48.80 07/18/2021   LDLCALC 107 (H) 07/18/2021   ALT 17 07/18/2021   AST 15 07/18/2021   NA 142 07/18/2021   K 4.1 07/18/2021  CL 109 07/18/2021   CREATININE 0.98 07/18/2021   BUN 17 07/18/2021   CO2 24 07/18/2021   TSH 1.07 07/18/2021   HGBA1C 5.3 07/18/2021    US Abdomen Complete  Result Date: 03/13/2017 CLINICAL DATA:  Upper abdominal discomfort and bloating and nausea for the past 4 5 days. History of gastroesophageal reflux, kidney stones, obesity. EXAM: ABDOMEN ULTRASOUND COMPLETE COMPARISON:  Abdominal ultrasound of January 14, 2014 FINDINGS: The study is limited due to the patient's body habitus. Gallbladder: No gallstones or wall thickening visualized. No sonographic Murphy sign noted by sonographer. Common bile duct: Diameter: 3 mm Liver: The hepatic echotexture is mildly increased diffusely. There is no focal mass nor ductal dilation. IVC: Visualization of the IVC is limited. Pancreas: Visualization of the pancreas is limited. Spleen: 7 cm in length Right Kidney: Length: 11.3 cm. Echogenicity within normal limits. No mass or hydronephrosis visualized. Left Kidney: Length: 10.4 cm. Echogenicity within normal limits. No mass or hydronephrosis visualized. Abdominal aorta: Visualization of the abdominal aorta was limited due to the patient's body habitus. Other findings: No ascites is observed. IMPRESSION: No gallstones or sonographic evidence of acute cholecystitis. If there are clinical concerns of chronic cholecystitis, a nuclear medicine  hepatobiliary scan with gallbladder ejection fraction determination may be useful. Fatty infiltrative change of the liver. Limited visualization of other abdominal viscera due to the patient's body habitus. Electronically Signed   By: David  Martinique M.D.   On: 03/13/2017 10:19     Assessment & Plan:  Plan    Meds ordered this encounter  Medications   methylPREDNISolone (MEDROL) 4 MG tablet    Sig: Take 5 tablets by mouth  x 3 day then 4 tabs x 3 day then 3 tabs x 3 day then 2 tabs x 3 day then 1 tab x 3 day and stop    Dispense:  45 tablet    Refill:  0   omeprazole (PRILOSEC) 40 MG capsule    Sig: Take 1 capsule (40 mg total) by mouth 2 (two) times daily.    Dispense:  180 capsule    Refill:  1    Problem List Items Addressed This Visit     GERD (gastroesophageal reflux disease)    Avoid offending foods, start probiotics. Do not eat large meals in late evening and consider raising head of bed. Refill given on Omeprazole presciption      Relevant Medications   omeprazole (PRILOSEC) 40 MG capsule   Hyperglycemia    hgba1c acceptable, minimize simple carbs. Increase exercise as tolerated.       Contact dermatitis    Patient is struggling with dermatitis from poison ivy or oak. Diffuse and causing her eyes to swelling, rash on arms, trunk. Itchy and irritated. Her neighbors were burning the vines in the back. She is asked to take Zyrtec and Famotidine bid and she is started on a slow medrol taper. She needs ot minimize her carbohydrate intake while she is on it. Report if no improvement.       Other Visit Diagnoses     Gastroesophageal reflux disease       Relevant Medications   omeprazole (PRILOSEC) 40 MG capsule       Follow-up: No follow-ups on file.  I, Suezanne Jacquet, acting as a scribe for Penni Homans, MD, have documented all relevent documentation on behalf of Penni Homans, MD, as directed by Penni Homans, MD while in the presence of Penni Homans, MD.  DO:07/29/21.  Jannetta Quint  Charlett Blake, MD personally performed the services described in this documentation. All medical record entries made by the scribe were at my direction and in my presence. I have reviewed the chart and agree that the record reflects my personal performance and is accurate and complete

## 2021-07-29 DIAGNOSIS — L259 Unspecified contact dermatitis, unspecified cause: Secondary | ICD-10-CM | POA: Insufficient documentation

## 2021-07-29 NOTE — Assessment & Plan Note (Signed)
Patient is struggling with dermatitis from poison ivy or oak. Diffuse and causing her eyes to swelling, rash on arms, trunk. Itchy and irritated. Her neighbors were burning the vines in the back. She is asked to take Zyrtec and Famotidine bid and she is started on a slow medrol taper. She needs ot minimize her carbohydrate intake while she is on it. Report if no improvement.

## 2021-07-29 NOTE — Assessment & Plan Note (Signed)
Avoid offending foods, start probiotics. Do not eat large meals in late evening and consider raising head of bed. Refill given on Omeprazole presciption

## 2021-07-29 NOTE — Assessment & Plan Note (Signed)
hgba1c acceptable, minimize simple carbs. Increase exercise as tolerated.  

## 2021-08-01 ENCOUNTER — Telehealth: Payer: Self-pay

## 2021-08-01 NOTE — Telephone Encounter (Signed)
   Telephone encounter was:  Unsuccessful.  08/01/2021 Name: Beverly Erickson MRN: [id number] DOB: June 29, 1971  Unsuccessful outbound call made today to assist with:   medication and utilities.  Outreach Attempt:  2nd Attempt  A HIPAA compliant voice message was left requesting a return call.  Instructed patient to call back at [phone].  [name], AAS Paralegal, [facility]  [address], [place] [address] ??[email or web address]  ?? [phone]   [email or web address]

## 2021-08-02 ENCOUNTER — Telehealth: Payer: Self-pay

## 2021-08-02 NOTE — Telephone Encounter (Signed)
   Telephone encounter was:  Unsuccessful.  08/02/2021 Name: Beverly Erickson MRN: [id number] DOB: 1970-11-30  Unsuccessful outbound call made today to assist with:   utilities and medication.  Left message on voicemail for patient to return my call regarding assistance with utilities and medication.  Outreach Attempt:  3rd Attempt.  Referral closed unable to contact patient.  A HIPAA compliant voice message was left requesting a return call.  Instructed patient to call back at [phone].  [name], AAS Paralegal, [facility]  [address], [place] [address] ??[name]  ?? [phone]   [email or web address]

## 2021-08-16 ENCOUNTER — Telehealth (HOSPITAL_BASED_OUTPATIENT_CLINIC_OR_DEPARTMENT_OTHER): Payer: Self-pay

## 2021-08-17 ENCOUNTER — Other Ambulatory Visit: Payer: Self-pay | Admitting: Family Medicine

## 2021-08-17 DIAGNOSIS — G9050 Complex regional pain syndrome I, unspecified: Secondary | ICD-10-CM

## 2021-08-17 DIAGNOSIS — M549 Dorsalgia, unspecified: Secondary | ICD-10-CM

## 2021-08-18 ENCOUNTER — Other Ambulatory Visit (HOSPITAL_BASED_OUTPATIENT_CLINIC_OR_DEPARTMENT_OTHER): Payer: Self-pay

## 2021-08-18 MED ORDER — HYDROCODONE-ACETAMINOPHEN 7.5-325 MG PO TABS
1.0000 | ORAL_TABLET | Freq: Three times a day (TID) | ORAL | 0 refills | Status: DC | PRN
  Filled 2021-08-18: qty 90, 30d supply, fill #0

## 2021-08-18 NOTE — Telephone Encounter (Signed)
Requesting: Norco 7.5-325 mg Contract: UDS: 07/2018 Last Visit: 07/18/2021 Next Visit: 12/19/2021 Last Refill: 07/18/2021 #90 x 0RF  Please Advise

## 2021-09-06 ENCOUNTER — Other Ambulatory Visit (HOSPITAL_BASED_OUTPATIENT_CLINIC_OR_DEPARTMENT_OTHER): Payer: Self-pay

## 2021-09-06 DIAGNOSIS — Z03818 Encounter for observation for suspected exposure to other biological agents ruled out: Secondary | ICD-10-CM | POA: Diagnosis not present

## 2021-09-06 DIAGNOSIS — U071 COVID-19: Secondary | ICD-10-CM | POA: Diagnosis not present

## 2021-09-06 DIAGNOSIS — R509 Fever, unspecified: Secondary | ICD-10-CM | POA: Diagnosis not present

## 2021-09-06 DIAGNOSIS — Z20822 Contact with and (suspected) exposure to covid-19: Secondary | ICD-10-CM | POA: Diagnosis not present

## 2021-09-06 MED ORDER — BENZONATATE 100 MG PO CAPS
ORAL_CAPSULE | ORAL | 0 refills | Status: DC
  Filled 2021-09-06: qty 28, 7d supply, fill #0

## 2021-09-06 MED ORDER — LAGEVRIO 200 MG PO CAPS
ORAL_CAPSULE | ORAL | 0 refills | Status: DC
Start: 2021-09-06 — End: 2021-12-19
  Filled 2021-09-06: qty 40, 5d supply, fill #0

## 2021-09-13 ENCOUNTER — Other Ambulatory Visit (HOSPITAL_BASED_OUTPATIENT_CLINIC_OR_DEPARTMENT_OTHER): Payer: Self-pay

## 2021-09-22 ENCOUNTER — Other Ambulatory Visit: Payer: Self-pay | Admitting: Family Medicine

## 2021-09-22 DIAGNOSIS — M549 Dorsalgia, unspecified: Secondary | ICD-10-CM

## 2021-09-22 DIAGNOSIS — G9050 Complex regional pain syndrome I, unspecified: Secondary | ICD-10-CM

## 2021-09-23 ENCOUNTER — Other Ambulatory Visit (HOSPITAL_BASED_OUTPATIENT_CLINIC_OR_DEPARTMENT_OTHER): Payer: Self-pay

## 2021-09-23 MED ORDER — HYDROCODONE-ACETAMINOPHEN 7.5-325 MG PO TABS
1.0000 | ORAL_TABLET | Freq: Three times a day (TID) | ORAL | 0 refills | Status: DC | PRN
  Filled 2021-09-23: qty 90, 30d supply, fill #0

## 2021-09-23 NOTE — Telephone Encounter (Signed)
Requesting: hydrocodone 7.5-325mg  Contract: 07/18/2021 UDS: 07/18/2021 Last Visit: 07/28/2021 Next Visit: 12/19/2021 Last Refill: 08/18/2021 #90 and 0RF  Please Advise

## 2021-09-29 ENCOUNTER — Other Ambulatory Visit (HOSPITAL_BASED_OUTPATIENT_CLINIC_OR_DEPARTMENT_OTHER): Payer: Self-pay

## 2021-10-27 ENCOUNTER — Other Ambulatory Visit (HOSPITAL_BASED_OUTPATIENT_CLINIC_OR_DEPARTMENT_OTHER): Payer: Self-pay

## 2021-10-27 ENCOUNTER — Other Ambulatory Visit: Payer: Self-pay | Admitting: Family Medicine

## 2021-10-27 DIAGNOSIS — G9050 Complex regional pain syndrome I, unspecified: Secondary | ICD-10-CM

## 2021-10-27 DIAGNOSIS — M549 Dorsalgia, unspecified: Secondary | ICD-10-CM

## 2021-10-27 MED ORDER — HYDROCODONE-ACETAMINOPHEN 7.5-325 MG PO TABS
1.0000 | ORAL_TABLET | Freq: Three times a day (TID) | ORAL | 0 refills | Status: DC | PRN
  Filled 2021-10-27: qty 90, 30d supply, fill #0

## 2021-10-27 NOTE — Telephone Encounter (Signed)
Requesting: hydrocodone 7.5-325mg  Contract: 07/18/2021 UDS: 07/18/2021 Last Visit: 07/28/2021 Next Visit: 12/19/2021 Last Refill: 09/23/2021 #90 and 0RF Pt sig: 1 tab tid prn  Please Advise

## 2021-11-23 ENCOUNTER — Other Ambulatory Visit (HOSPITAL_COMMUNITY): Payer: Self-pay

## 2021-11-30 ENCOUNTER — Other Ambulatory Visit: Payer: Self-pay | Admitting: Family Medicine

## 2021-11-30 ENCOUNTER — Other Ambulatory Visit (HOSPITAL_BASED_OUTPATIENT_CLINIC_OR_DEPARTMENT_OTHER): Payer: Self-pay

## 2021-11-30 DIAGNOSIS — G9050 Complex regional pain syndrome I, unspecified: Secondary | ICD-10-CM

## 2021-11-30 DIAGNOSIS — M549 Dorsalgia, unspecified: Secondary | ICD-10-CM

## 2021-11-30 MED ORDER — HYDROCODONE-ACETAMINOPHEN 7.5-325 MG PO TABS
1.0000 | ORAL_TABLET | Freq: Three times a day (TID) | ORAL | 0 refills | Status: DC | PRN
  Filled 2021-11-30: qty 90, 30d supply, fill #0

## 2021-11-30 NOTE — Telephone Encounter (Signed)
Requesting:Hydrocodone Contract:08/03/21 UDS:07/18/21 Last Visit:07/28/21 Next Visit:12/19/21 Last Refill:07/28/21  Please Advise

## 2021-11-30 NOTE — Telephone Encounter (Signed)
Requesting: Contract: UDS: Last Visit: Next Visit: Last Refill:  Please Advise  

## 2021-12-19 ENCOUNTER — Other Ambulatory Visit (HOSPITAL_BASED_OUTPATIENT_CLINIC_OR_DEPARTMENT_OTHER): Payer: Self-pay

## 2021-12-19 ENCOUNTER — Ambulatory Visit: Payer: Medicare Other | Admitting: Family Medicine

## 2021-12-19 ENCOUNTER — Telehealth: Payer: Self-pay | Admitting: Family

## 2021-12-19 ENCOUNTER — Ambulatory Visit (INDEPENDENT_AMBULATORY_CARE_PROVIDER_SITE_OTHER): Payer: Medicare (Managed Care) | Admitting: Family

## 2021-12-19 VITALS — BP 122/70 | HR 73 | Temp 98.4°F | Resp 16 | Ht 67.0 in | Wt 230.0 lb

## 2021-12-19 DIAGNOSIS — G9050 Complex regional pain syndrome I, unspecified: Secondary | ICD-10-CM | POA: Diagnosis not present

## 2021-12-19 DIAGNOSIS — K219 Gastro-esophageal reflux disease without esophagitis: Secondary | ICD-10-CM

## 2021-12-19 DIAGNOSIS — K5900 Constipation, unspecified: Secondary | ICD-10-CM | POA: Diagnosis not present

## 2021-12-19 DIAGNOSIS — Z79899 Other long term (current) drug therapy: Secondary | ICD-10-CM | POA: Diagnosis not present

## 2021-12-19 DIAGNOSIS — M549 Dorsalgia, unspecified: Secondary | ICD-10-CM

## 2021-12-19 DIAGNOSIS — R1011 Right upper quadrant pain: Secondary | ICD-10-CM

## 2021-12-19 DIAGNOSIS — F418 Other specified anxiety disorders: Secondary | ICD-10-CM

## 2021-12-19 DIAGNOSIS — R232 Flushing: Secondary | ICD-10-CM

## 2021-12-19 LAB — HEPATIC FUNCTION PANEL
ALT: 14 U/L (ref 0–35)
AST: 14 U/L (ref 0–37)
Albumin: 4.1 g/dL (ref 3.5–5.2)
Alkaline Phosphatase: 57 U/L (ref 39–117)
Bilirubin, Direct: 0.1 mg/dL (ref 0.0–0.3)
Total Bilirubin: 0.4 mg/dL (ref 0.2–1.2)
Total Protein: 6.5 g/dL (ref 6.0–8.3)

## 2021-12-19 MED ORDER — CITALOPRAM HYDROBROMIDE 20 MG PO TABS
ORAL_TABLET | ORAL | 0 refills | Status: DC
  Filled 2021-12-19: qty 25, 30d supply, fill #0
  Filled 2022-04-10: qty 25, 30d supply, fill #1

## 2021-12-19 MED ORDER — HYDROCODONE-ACETAMINOPHEN 7.5-325 MG PO TABS
1.0000 | ORAL_TABLET | Freq: Three times a day (TID) | ORAL | 0 refills | Status: DC | PRN
  Filled 2021-12-19 – 2022-01-05 (×2): qty 90, 30d supply, fill #0

## 2021-12-19 NOTE — Assessment & Plan Note (Signed)
Maintained on hydrocodone/apap for chronic pain. She continues to find this medication helpful. Requesting refill. Reviewed PDMP aware, refill sent for #90 tabs not to be filled prior to 3/15. Controlled substance contract is updated today and UDS has been ordered.  ?

## 2021-12-19 NOTE — Assessment & Plan Note (Signed)
Stable on PPI, continue same.  

## 2021-12-19 NOTE — Assessment & Plan Note (Signed)
Uncontrolled. Recommended that she establish with a counselor.  Will also initiate citalopram '20mg'$ .  I instructed pt to start 1/2 tablet once daily for 1 week and then increase to a full tablet once daily on week two as tolerated.  We discussed common side effects such as nausea, drowsiness and weight. Pt verbalizes understanding.  Plan follow up in 1 month to evaluate progress.   ? ? ?

## 2021-12-19 NOTE — Assessment & Plan Note (Signed)
Uncontrolled. Trial of citalopram.  ?

## 2021-12-19 NOTE — Assessment & Plan Note (Signed)
Uncontrolled. Recommended that she take miralax once daily as needed for constipation.  ?
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
## 2021-12-19 NOTE — Patient Instructions (Signed)
Please call [facility] to schedule an appointment with a counselor.  ?Start citalopram 1/2 tab once daily for 1 week, then increase to a full tab once daily on week two. ?Add miralax 1 capful in 8oz of fluid once daily as needed for constipation.  ?

## 2021-12-19 NOTE — Telephone Encounter (Signed)
See mychart.  

## 2021-12-19 NOTE — Assessment & Plan Note (Signed)
Will obtain LFT's and abd [place] to further evaluate the abdominal pain and take a closer look at her gallbladder.  ?

## 2021-12-20 ENCOUNTER — Telehealth (HOSPITAL_BASED_OUTPATIENT_CLINIC_OR_DEPARTMENT_OTHER): Payer: Self-pay

## 2021-12-20 LAB — DRUG MONITORING, PANEL 8 WITH CONFIRMATION, URINE
6 Acetylmorphine: NEGATIVE ng/mL (ref <10)
Alcohol Metabolites: NEGATIVE ng/mL (ref <500)
Amphetamines: NEGATIVE ng/mL (ref <500)
Benzodiazepines: NEGATIVE ng/mL (ref <100)
Buprenorphine, Urine: NEGATIVE ng/mL (ref <5)
Cocaine Metabolite: NEGATIVE ng/mL (ref <150)
Creatinine: 138.6 mg/dL (ref >=20.0)
MDMA: NEGATIVE ng/mL (ref <500)
Marijuana Metabolite: NEGATIVE ng/mL (ref <20)
Opiates: NEGATIVE ng/mL (ref <100)
Oxidant: NEGATIVE ug/mL (ref <200)
Oxycodone: NEGATIVE ng/mL (ref <100)
pH: 6.6 (ref 4.5–9.0)

## 2021-12-20 LAB — DM TEMPLATE

## 2022-01-04 ENCOUNTER — Other Ambulatory Visit: Payer: Self-pay

## 2022-01-04 ENCOUNTER — Ambulatory Visit (HOSPITAL_BASED_OUTPATIENT_CLINIC_OR_DEPARTMENT_OTHER)
Admission: RE | Admit: 2022-01-04 | Discharge: 2022-01-04 | Disposition: A | Discharge status: Home / Self Care | Payer: Medicare (Managed Care) | Source: Ambulatory Visit | Attending: Family | Admitting: Family

## 2022-01-04 DIAGNOSIS — R1011 Right upper quadrant pain: Secondary | ICD-10-CM | POA: Insufficient documentation

## 2022-01-05 ENCOUNTER — Other Ambulatory Visit (HOSPITAL_BASED_OUTPATIENT_CLINIC_OR_DEPARTMENT_OTHER): Payer: Self-pay

## 2022-01-09 ENCOUNTER — Ambulatory Visit: Payer: Medicare (Managed Care) | Admitting: Family

## 2022-02-04 ENCOUNTER — Other Ambulatory Visit (HOSPITAL_BASED_OUTPATIENT_CLINIC_OR_DEPARTMENT_OTHER): Payer: Self-pay

## 2022-02-04 ENCOUNTER — Other Ambulatory Visit: Payer: Self-pay | Admitting: Family

## 2022-02-04 DIAGNOSIS — M549 Dorsalgia, unspecified: Secondary | ICD-10-CM

## 2022-02-04 DIAGNOSIS — G9050 Complex regional pain syndrome I, unspecified: Secondary | ICD-10-CM

## 2022-02-05 MED ORDER — HYDROCODONE-ACETAMINOPHEN 7.5-325 MG PO TABS
1.0000 | ORAL_TABLET | Freq: Three times a day (TID) | ORAL | 0 refills | Status: DC | PRN
  Filled 2022-02-05: qty 90, 30d supply, fill #0

## 2022-02-06 ENCOUNTER — Other Ambulatory Visit (HOSPITAL_BASED_OUTPATIENT_CLINIC_OR_DEPARTMENT_OTHER): Payer: Self-pay

## 2022-02-13 ENCOUNTER — Other Ambulatory Visit (HOSPITAL_BASED_OUTPATIENT_CLINIC_OR_DEPARTMENT_OTHER): Payer: Self-pay

## 2022-02-16 ENCOUNTER — Other Ambulatory Visit (HOSPITAL_BASED_OUTPATIENT_CLINIC_OR_DEPARTMENT_OTHER): Payer: Self-pay

## 2022-03-06 ENCOUNTER — Other Ambulatory Visit (HOSPITAL_BASED_OUTPATIENT_CLINIC_OR_DEPARTMENT_OTHER): Payer: Self-pay

## 2022-03-10 ENCOUNTER — Other Ambulatory Visit (HOSPITAL_BASED_OUTPATIENT_CLINIC_OR_DEPARTMENT_OTHER): Payer: Self-pay

## 2022-03-10 ENCOUNTER — Other Ambulatory Visit: Payer: Self-pay | Admitting: Family Medicine

## 2022-03-10 DIAGNOSIS — M549 Dorsalgia, unspecified: Secondary | ICD-10-CM

## 2022-03-10 DIAGNOSIS — G9050 Complex regional pain syndrome I, unspecified: Secondary | ICD-10-CM

## 2022-03-10 MED ORDER — HYDROCODONE-ACETAMINOPHEN 7.5-325 MG PO TABS
1.0000 | ORAL_TABLET | Freq: Three times a day (TID) | ORAL | 0 refills | Status: DC | PRN
  Filled 2022-03-10: qty 90, 30d supply, fill #0

## 2022-03-10 NOTE — Telephone Encounter (Signed)
Requesting: hydrocodone 7.5-'325mg'$   Contract: 12/19/21 UDS: 12/19/21  Last Visit: 12/19/21 Next Visit: None Last Refill: 02/05/22 #90 and 0RF  Please Advise

## 2022-04-03 NOTE — Progress Notes (Unsigned)
Subjective:    Patient ID: Beverly Erickson, female    DOB: August 01, 1971, 51 y.o.   MRN: [id number]  No chief complaint on file.   HPI Patient is in today for a follow up.  Past Medical History:  Diagnosis Date   Anemia    Chicken pox    Depression    Depression with anxiety 08/30/2015   GERD (gastroesophageal reflux disease)    Kidney stone 02/13/2014   Right, small   Neck pain, musculoskeletal 11/17/2016   Obesity    Other and unspecified hyperlipidemia 01/14/2014   Otitis media 11/17/2016   Preventative health care 07/05/2014   RSD (reflex sympathetic dystrophy)    Tinea corporis 06/10/2017   Tobacco use disorder 01/14/2014   1ppd   Unspecified constipation 05/25/2014   Urinary frequency 03/06/2017    Past Surgical History:  Procedure Laterality Date   CESAREAN SECTION  2000   plate and screws in left arm  2005   humerus   WISDOM TOOTH EXTRACTION  51 yrs old    Family History  Problem Relation Age of Onset   Arthritis Mother        Living   Hypertension Mother    Thrombocytopenia Mother    Hyperlipidemia Father    Hypertension Father    Thrombocytopenia Maternal Grandmother    Stroke Maternal Grandmother    Hypertension Maternal Grandmother    Alcohol abuse Maternal Grandfather    Heart disease Maternal Grandfather    Parkinson's disease Maternal Grandfather    Alcohol abuse Paternal Grandfather    Cancer Paternal Grandfather    Hypothyroidism Daughter    Diabetes Daughter        type 1   Dementia Paternal Grandmother     Social History   Socioeconomic History   Marital status: Married    Spouse name: Not on file   Number of children: 3   Years of education: Not on file   Highest education level: Not on file  Occupational History   Occupation: Conservation officer, nature    Comment: Disability, but works at [employer] part time  Tobacco Use   Smoking status: Every Day    Packs/day: 1.00    Years: 20.00    Total pack years: 20.00    Types: Cigarettes   Smokeless tobacco:  Never  Substance and Sexual Activity   Alcohol use: Yes    Comment: once a year.   Drug use: No   Sexual activity: Not on file    Comment: lives with son, 50 yo daughter, boyfriend, no dietary  Other Topics Concern   Not on file  Social History Narrative   Children live nearby   Social Determinants of Health   Financial Resource Strain: Medium Risk (07/09/2021)   Overall Financial Resource Strain (CARDIA)    Difficulty of Paying Living Expenses: Somewhat hard  Food Insecurity: Food Insecurity Present (07/09/2021)   Hunger Vital Sign    Worried About Running Out of Food in the Last Year: Sometimes true    Ran Out of Food in the Last Year: Never true  Transportation Needs: No Transportation Needs (07/09/2021)   PRAPARE - Administrator, Civil Service (Medical): No    Lack of Transportation (Non-Medical): No  Physical Activity: Inactive (07/09/2021)   Exercise Vital Sign    Days of Exercise per Week: 0 days    Minutes of Exercise per Session: 0 min  Stress: Stress Concern Present (07/09/2021)   [employer] of Occupational Health - Occupational  Stress Questionnaire    Feeling of Stress : To some extent  Social Connections: Moderately Isolated (07/09/2021)   Social Connection and Isolation Panel [NHANES]    Frequency of Communication with Friends and Family: More than three times a week    Frequency of Social Gatherings with Friends and Family: More than three times a week    Attends Religious Services: Never    Database administrator or Organizations: No    Attends Banker Meetings: Never    Marital Status: Married  Catering manager Violence: Not At Risk (07/09/2021)   Humiliation, Afraid, Rape, and Kick questionnaire    Fear of Current or Ex-Partner: No    Emotionally Abused: No    Physically Abused: No    Sexually Abused: No    Outpatient Medications Prior to Visit  Medication Sig Dispense Refill   albuterol (PROVENTIL HFA;VENTOLIN HFA) 108 (90  Base) MCG/ACT inhaler Inhale 2 puffs into the lungs every 6 (six) hours as needed for wheezing or shortness of breath. 2 Inhaler 2   albuterol (VENTOLIN HFA) 108 (90 Base) MCG/ACT inhaler Inhale 2 puffs into the lungs every 6 (six) hours as needed for wheezing or shortness of breath. 18 g 2   aspirin 81 MG tablet Take 81 mg by mouth daily.     cetirizine (ZYRTEC) 10 MG tablet Take 1 tablet (10 mg total) by mouth daily. 30 tablet 11   citalopram (CELEXA) 20 MG tablet Take 1/2 tablet by mouth once daily for 1 week, then increase to a full tab once daily at bedtime 30 tablet 0   fluticasone (FLONASE) 50 MCG/ACT nasal spray Place 2 sprays into both nostrils daily. 16 g 6   HYDROcodone-acetaminophen (NORCO) 7.5-325 MG tablet Take 1 tablet by mouth 3 (three) times daily as needed for moderate pain. 90 tablet 0   KRILL OIL PO Take 1 capsule by mouth daily.     nitroGLYCERIN (NITROSTAT) 0.4 MG SL tablet Place 1 tablet (0.4 mg total) under the tongue every 5 (five) minutes as needed for chest pain. 25 tablet 1   omeprazole (PRILOSEC) 40 MG capsule Take 1 capsule (40 mg total) by mouth 2 (two) times daily. 180 capsule 1   Potassium 75 MG TABS Take 1 each by mouth at bedtime.     pramipexole (MIRAPEX) 1 MG tablet Take 1 tablet (1 mg total) by mouth at bedtime. 30 tablet 2   No facility-administered medications prior to visit.    Allergies  Allergen Reactions   Codeine Nausea Only   Mucinex [Guaifenesin Er] Other (See Comments)    Nightmares    ROS     Objective:    Physical Exam  There were no vitals taken for this visit. Wt Readings from Last 3 Encounters:  12/19/21 230 lb (104.3 kg)  07/28/21 233 lb 12.8 oz (106.1 kg)  07/18/21 235 lb 9.6 oz (106.9 kg)    Diabetic Foot Exam - Simple   No data filed    Lab Results  Component Value Date   WBC 7.2 07/18/2021   HGB 13.8 07/18/2021   HCT 40.8 07/18/2021   PLT 246.0 07/18/2021   GLUCOSE 78 07/18/2021   CHOL 177 07/18/2021   TRIG  108.0 07/18/2021   HDL 48.80 07/18/2021   LDLCALC 107 (H) 07/18/2021   ALT 14 12/19/2021   AST 14 12/19/2021   NA 142 07/18/2021   K 4.1 07/18/2021   CL 109 07/18/2021   CREATININE 0.98 07/18/2021   BUN  17 07/18/2021   CO2 24 07/18/2021   TSH 1.07 07/18/2021   HGBA1C 5.3 07/18/2021    Lab Results  Component Value Date   TSH 1.07 07/18/2021   Lab Results  Component Value Date   WBC 7.2 07/18/2021   HGB 13.8 07/18/2021   HCT 40.8 07/18/2021   MCV 92.4 07/18/2021   PLT 246.0 07/18/2021   Lab Results  Component Value Date   NA 142 07/18/2021   K 4.1 07/18/2021   CO2 24 07/18/2021   GLUCOSE 78 07/18/2021   BUN 17 07/18/2021   CREATININE 0.98 07/18/2021   BILITOT 0.4 12/19/2021   ALKPHOS 57 12/19/2021   AST 14 12/19/2021   ALT 14 12/19/2021   PROT 6.5 12/19/2021   ALBUMIN 4.1 12/19/2021   CALCIUM 9.0 07/18/2021   GFR 67.85 07/18/2021   Lab Results  Component Value Date   CHOL 177 07/18/2021   Lab Results  Component Value Date   HDL 48.80 07/18/2021   Lab Results  Component Value Date   LDLCALC 107 (H) 07/18/2021   Lab Results  Component Value Date   TRIG 108.0 07/18/2021   Lab Results  Component Value Date   CHOLHDL 4 07/18/2021   Lab Results  Component Value Date   HGBA1C 5.3 07/18/2021       Assessment & Plan:   COLONOSCOPY: PAP: PSA: DEXA:   Problem List Items Addressed This Visit   None   I am having Beverly Erickson maintain her Potassium, aspirin, KRILL OIL PO, pramipexole, nitroGLYCERIN, albuterol, fluticasone, cetirizine, albuterol, omeprazole, citalopram, and HYDROcodone-acetaminophen.  No orders of the defined types were placed in this encounter.

## 2022-04-04 ENCOUNTER — Ambulatory Visit: Payer: Medicare (Managed Care) | Admitting: Family Medicine

## 2022-04-04 DIAGNOSIS — R739 Hyperglycemia, unspecified: Secondary | ICD-10-CM

## 2022-04-04 DIAGNOSIS — E782 Mixed hyperlipidemia: Secondary | ICD-10-CM

## 2022-04-04 DIAGNOSIS — F418 Other specified anxiety disorders: Secondary | ICD-10-CM

## 2022-04-04 DIAGNOSIS — E6609 Other obesity due to excess calories: Secondary | ICD-10-CM

## 2022-04-04 DIAGNOSIS — K219 Gastro-esophageal reflux disease without esophagitis: Secondary | ICD-10-CM

## 2022-04-10 ENCOUNTER — Telehealth: Payer: Self-pay

## 2022-04-10 ENCOUNTER — Other Ambulatory Visit: Payer: Self-pay | Admitting: Family Medicine

## 2022-04-10 ENCOUNTER — Other Ambulatory Visit: Payer: Self-pay

## 2022-04-10 ENCOUNTER — Other Ambulatory Visit (HOSPITAL_BASED_OUTPATIENT_CLINIC_OR_DEPARTMENT_OTHER): Payer: Self-pay

## 2022-04-10 ENCOUNTER — Other Ambulatory Visit: Payer: Self-pay | Admitting: Family

## 2022-04-10 DIAGNOSIS — G9050 Complex regional pain syndrome I, unspecified: Secondary | ICD-10-CM

## 2022-04-10 DIAGNOSIS — M549 Dorsalgia, unspecified: Secondary | ICD-10-CM

## 2022-04-10 DIAGNOSIS — K219 Gastro-esophageal reflux disease without esophagitis: Secondary | ICD-10-CM

## 2022-04-10 MED ORDER — OMEPRAZOLE 40 MG PO CPDR
40.0000 mg | DELAYED_RELEASE_CAPSULE | Freq: Two times a day (BID) | ORAL | 1 refills | Status: DC
  Filled 2022-04-11 – 2022-12-26 (×3): qty 180, 90d supply, fill #0

## 2022-04-10 MED ORDER — HYDROCODONE-ACETAMINOPHEN 7.5-325 MG PO TABS
1.0000 | ORAL_TABLET | Freq: Three times a day (TID) | ORAL | 0 refills | Status: DC | PRN
  Filled 2022-04-10: qty 90, 30d supply, fill #0

## 2022-04-10 NOTE — Telephone Encounter (Signed)
Request for Hydrocodone Last RX:03-10-22 Last OV:3-06-223 with Melissa Next OV:04-25-2022 with Abner Greenspan UDS:12-19-21 CSC:12-19-21

## 2022-04-11 ENCOUNTER — Other Ambulatory Visit (HOSPITAL_BASED_OUTPATIENT_CLINIC_OR_DEPARTMENT_OTHER): Payer: Self-pay

## 2022-04-12 ENCOUNTER — Other Ambulatory Visit (HOSPITAL_BASED_OUTPATIENT_CLINIC_OR_DEPARTMENT_OTHER): Payer: Self-pay

## 2022-04-13 ENCOUNTER — Ambulatory Visit: Payer: Medicare (Managed Care) | Admitting: Family Medicine

## 2022-04-20 ENCOUNTER — Other Ambulatory Visit (HOSPITAL_BASED_OUTPATIENT_CLINIC_OR_DEPARTMENT_OTHER): Payer: Self-pay

## 2022-04-24 ENCOUNTER — Other Ambulatory Visit (HOSPITAL_BASED_OUTPATIENT_CLINIC_OR_DEPARTMENT_OTHER): Payer: Self-pay

## 2022-04-24 NOTE — Progress Notes (Deleted)
Subjective:    Patient ID: Beverly Erickson, female    DOB: Sep 09, 1971, 51 y.o.   MRN: [id number]  No chief complaint on file.   HPI Patient is in today for a follow up.  Past Medical History:  Diagnosis Date   Anemia    Chicken pox    Depression    Depression with anxiety 08/30/2015   GERD (gastroesophageal reflux disease)    Kidney stone 02/13/2014   Right, small   Neck pain, musculoskeletal 11/17/2016   Obesity    Other and unspecified hyperlipidemia 01/14/2014   Otitis media 11/17/2016   Preventative health care 07/05/2014   RSD (reflex sympathetic dystrophy)    Tinea corporis 06/10/2017   Tobacco use disorder 01/14/2014   1ppd   Unspecified constipation 05/25/2014   Urinary frequency 03/06/2017    Past Surgical History:  Procedure Laterality Date   CESAREAN SECTION  2000   plate and screws in left arm  2005   humerus   WISDOM TOOTH EXTRACTION  51 yrs old    Family History  Problem Relation Age of Onset   Arthritis Mother        Living   Hypertension Mother    Thrombocytopenia Mother    Hyperlipidemia Father    Hypertension Father    Thrombocytopenia Maternal Grandmother    Stroke Maternal Grandmother    Hypertension Maternal Grandmother    Alcohol abuse Maternal Grandfather    Heart disease Maternal Grandfather    Parkinson's disease Maternal Grandfather    Alcohol abuse Paternal Grandfather    Cancer Paternal Grandfather    Hypothyroidism Daughter    Diabetes Daughter        type 1   Dementia Paternal Grandmother     Social History   Socioeconomic History   Marital status: Married    Spouse name: Not on file   Number of children: 3   Years of education: Not on file   Highest education level: Not on file  Occupational History   Occupation: Scientist, water quality    Comment: Disability, but works at [employer] part time  Tobacco Use   Smoking status: Every Day    Packs/day: 1.00    Years: 20.00    Total pack years: 20.00    Types: Cigarettes   Smokeless tobacco:  Never  Substance and Sexual Activity   Alcohol use: Yes    Comment: once a year.   Drug use: No   Sexual activity: Not on file    Comment: lives with son, 81 yo daughter, boyfriend, no dietary  Other Topics Concern   Not on file  Social History Narrative   Children live nearby   Social Determinants of Health   Financial Resource Strain: Medium Risk (07/09/2021)   Overall Financial Resource Strain (CARDIA)    Difficulty of Paying Living Expenses: Somewhat hard  Food Insecurity: Food Insecurity Present (07/09/2021)   Hunger Vital Sign    Worried About Running Out of Food in the Last Year: Sometimes true    Ran Out of Food in the Last Year: Never true  Transportation Needs: No Transportation Needs (07/09/2021)   PRAPARE - Hydrologist (Medical): No    Lack of Transportation (Non-Medical): No  Physical Activity: Inactive (07/09/2021)   Exercise Vital Sign    Days of Exercise per Week: 0 days    Minutes of Exercise per Session: 0 min  Stress: Stress Concern Present (07/09/2021)   [place]  Stress Questionnaire    Feeling of Stress : To some extent  Social Connections: Moderately Isolated (07/09/2021)   Social Connection and Isolation Panel [NHANES]    Frequency of Communication with Friends and Family: More than three times a week    Frequency of Social Gatherings with Friends and Family: More than three times a week    Attends Religious Services: Never    Marine scientist or Organizations: No    Attends Archivist Meetings: Never    Marital Status: Married  Human resources officer Violence: Not At Risk (07/09/2021)   Humiliation, Afraid, Rape, and Kick questionnaire    Fear of Current or Ex-Partner: No    Emotionally Abused: No    Physically Abused: No    Sexually Abused: No    Outpatient Medications Prior to Visit  Medication Sig Dispense Refill   albuterol (PROVENTIL HFA;VENTOLIN HFA) 108 (90  Base) MCG/ACT inhaler Inhale 2 puffs into the lungs every 6 (six) hours as needed for wheezing or shortness of breath. 2 Inhaler 2   albuterol (VENTOLIN HFA) 108 (90 Base) MCG/ACT inhaler Inhale 2 puffs into the lungs every 6 (six) hours as needed for wheezing or shortness of breath. 18 g 2   aspirin 81 MG tablet Take 81 mg by mouth daily.     cetirizine (ZYRTEC) 10 MG tablet Take 1 tablet (10 mg total) by mouth daily. 30 tablet 11   citalopram (CELEXA) 20 MG tablet Take 1/2 tablet by mouth once daily for 1 week, then increase to a full tab once daily at bedtime 30 tablet 0   fluticasone (FLONASE) 50 MCG/ACT nasal spray Place 2 sprays into both nostrils daily. 16 g 6   HYDROcodone-acetaminophen (NORCO) 7.5-325 MG tablet Take 1 tablet by mouth 3 (three) times daily as needed for moderate pain. 90 tablet 0   KRILL OIL PO Take 1 capsule by mouth daily.     nitroGLYCERIN (NITROSTAT) 0.4 MG SL tablet Place 1 tablet (0.4 mg total) under the tongue every 5 (five) minutes as needed for chest pain. 25 tablet 1   omeprazole (PRILOSEC) 40 MG capsule Take 1 capsule (40 mg total) by mouth 2 (two) times daily. 180 capsule 1   Potassium 75 MG TABS Take 1 each by mouth at bedtime.     pramipexole (MIRAPEX) 1 MG tablet Take 1 tablet (1 mg total) by mouth at bedtime. 30 tablet 2   No facility-administered medications prior to visit.    Allergies  Allergen Reactions   Codeine Nausea Only   Mucinex [Guaifenesin Er] Other (See Comments)    Nightmares    ROS     Objective:    Physical Exam  There were no vitals taken for this visit. Wt Readings from Last 3 Encounters:  12/19/21 230 lb (104.3 kg)  07/28/21 233 lb 12.8 oz (106.1 kg)  07/18/21 235 lb 9.6 oz (106.9 kg)    Diabetic Foot Exam - Simple   No data filed    Lab Results  Component Value Date   WBC 7.2 07/18/2021   HGB 13.8 07/18/2021   HCT 40.8 07/18/2021   PLT 246.0 07/18/2021   GLUCOSE 78 07/18/2021   CHOL 177 07/18/2021   TRIG  108.0 07/18/2021   HDL 48.80 07/18/2021   LDLCALC 107 (H) 07/18/2021   ALT 14 12/19/2021   AST 14 12/19/2021   NA 142 07/18/2021   K 4.1 07/18/2021   CL 109 07/18/2021   CREATININE 0.98 07/18/2021   BUN  17 07/18/2021   CO2 24 07/18/2021   TSH 1.07 07/18/2021   HGBA1C 5.3 07/18/2021    Lab Results  Component Value Date   TSH 1.07 07/18/2021   Lab Results  Component Value Date   WBC 7.2 07/18/2021   HGB 13.8 07/18/2021   HCT 40.8 07/18/2021   MCV 92.4 07/18/2021   PLT 246.0 07/18/2021   Lab Results  Component Value Date   NA 142 07/18/2021   K 4.1 07/18/2021   CO2 24 07/18/2021   GLUCOSE 78 07/18/2021   BUN 17 07/18/2021   CREATININE 0.98 07/18/2021   BILITOT 0.4 12/19/2021   ALKPHOS 57 12/19/2021   AST 14 12/19/2021   ALT 14 12/19/2021   PROT 6.5 12/19/2021   ALBUMIN 4.1 12/19/2021   CALCIUM 9.0 07/18/2021   GFR 67.85 07/18/2021   Lab Results  Component Value Date   CHOL 177 07/18/2021   Lab Results  Component Value Date   HDL 48.80 07/18/2021   Lab Results  Component Value Date   LDLCALC 107 (H) 07/18/2021   Lab Results  Component Value Date   TRIG 108.0 07/18/2021   Lab Results  Component Value Date   CHOLHDL 4 07/18/2021   Lab Results  Component Value Date   HGBA1C 5.3 07/18/2021       Assessment & Plan:      Problem List Items Addressed This Visit   None   I am having Tonny Bollman maintain her Potassium, aspirin, KRILL OIL PO, pramipexole, nitroGLYCERIN, albuterol, fluticasone, cetirizine, albuterol, citalopram, omeprazole, and HYDROcodone-acetaminophen.  No orders of the defined types were placed in this encounter.

## 2022-04-25 ENCOUNTER — Ambulatory Visit: Payer: Medicare (Managed Care) | Admitting: Family Medicine

## 2022-04-25 DIAGNOSIS — R739 Hyperglycemia, unspecified: Secondary | ICD-10-CM

## 2022-04-25 DIAGNOSIS — E782 Mixed hyperlipidemia: Secondary | ICD-10-CM

## 2022-04-25 DIAGNOSIS — F418 Other specified anxiety disorders: Secondary | ICD-10-CM

## 2022-05-01 ENCOUNTER — Other Ambulatory Visit (HOSPITAL_BASED_OUTPATIENT_CLINIC_OR_DEPARTMENT_OTHER): Payer: Self-pay

## 2022-05-10 ENCOUNTER — Other Ambulatory Visit (HOSPITAL_BASED_OUTPATIENT_CLINIC_OR_DEPARTMENT_OTHER): Payer: Self-pay

## 2022-05-10 ENCOUNTER — Other Ambulatory Visit: Payer: Self-pay | Admitting: Family Medicine

## 2022-05-10 DIAGNOSIS — G9050 Complex regional pain syndrome I, unspecified: Secondary | ICD-10-CM

## 2022-05-10 DIAGNOSIS — M549 Dorsalgia, unspecified: Secondary | ICD-10-CM

## 2022-05-10 MED ORDER — HYDROCODONE-ACETAMINOPHEN 7.5-325 MG PO TABS
1.0000 | ORAL_TABLET | Freq: Three times a day (TID) | ORAL | 0 refills | Status: DC | PRN
  Filled 2022-05-10: qty 90, 30d supply, fill #0

## 2022-05-10 NOTE — Telephone Encounter (Signed)
Requesting: hydrocodone 7.5-'325mg'$  Contract: 12/19/21 UDS: 12/19/21 Last Visit: 12/19/21 w/ Lenna Sciara Next Visit: None Last Refill: 04/10/22 #90 and 0RF  Please Advise

## 2022-05-11 ENCOUNTER — Other Ambulatory Visit (HOSPITAL_BASED_OUTPATIENT_CLINIC_OR_DEPARTMENT_OTHER): Payer: Self-pay

## 2022-05-15 ENCOUNTER — Encounter (HOSPITAL_BASED_OUTPATIENT_CLINIC_OR_DEPARTMENT_OTHER): Payer: Self-pay | Admitting: Emergency Medicine

## 2022-05-15 ENCOUNTER — Other Ambulatory Visit: Payer: Self-pay

## 2022-05-15 DIAGNOSIS — K5900 Constipation, unspecified: Secondary | ICD-10-CM | POA: Insufficient documentation

## 2022-05-15 DIAGNOSIS — R1084 Generalized abdominal pain: Secondary | ICD-10-CM | POA: Diagnosis present

## 2022-05-15 DIAGNOSIS — R9431 Abnormal electrocardiogram [ECG] [EKG]: Secondary | ICD-10-CM | POA: Diagnosis not present

## 2022-05-15 DIAGNOSIS — Z7982 Long term (current) use of aspirin: Secondary | ICD-10-CM | POA: Diagnosis not present

## 2022-05-15 LAB — CBC
HCT: 40.2 % (ref 36.0–46.0)
Hemoglobin: 13.9 g/dL (ref 12.0–15.0)
MCH: 30.7 pg (ref 26.0–34.0)
MCHC: 34.6 g/dL (ref 30.0–36.0)
MCV: 88.7 fL (ref 80.0–100.0)
Platelets: 247 10*3/uL (ref 150–400)
RBC: 4.53 MIL/uL (ref 3.87–5.11)
RDW: 12.3 % (ref 11.5–15.5)
WBC: 13.6 10*3/uL — ABNORMAL HIGH (ref 4.0–10.5)
nRBC: 0 % (ref 0.0–0.2)

## 2022-05-15 MED ORDER — ONDANSETRON 4 MG PO TBDP
4.0000 mg | ORAL_TABLET | Freq: Once | ORAL | Status: AC | PRN
  Administered 2022-05-15: 4 mg via ORAL
  Filled 2022-05-15: qty 1

## 2022-05-15 NOTE — ED Triage Notes (Signed)
Pt POV c/o abd pain, n/v, starting this AM. Denies diarrhea, denies fevers.

## 2022-05-16 ENCOUNTER — Emergency Department (HOSPITAL_BASED_OUTPATIENT_CLINIC_OR_DEPARTMENT_OTHER)
Admission: EM | Admit: 2022-05-16 | Discharge: 2022-05-16 | Disposition: A | Discharge status: Home / Self Care | Payer: Medicare (Managed Care) | Attending: Emergency Medicine | Admitting: Emergency Medicine

## 2022-05-16 DIAGNOSIS — K5900 Constipation, unspecified: Secondary | ICD-10-CM

## 2022-05-16 LAB — COMPREHENSIVE METABOLIC PANEL
ALT: 20 U/L (ref 0–44)
AST: 21 U/L (ref 15–41)
Albumin: 3.8 g/dL (ref 3.5–5.0)
Alkaline Phosphatase: 59 U/L (ref 38–126)
Anion gap: 9 (ref 5–15)
BUN: 18 mg/dL (ref 6–20)
CO2: 19 mmol/L — ABNORMAL LOW (ref 22–32)
Calcium: 8.9 mg/dL (ref 8.9–10.3)
Chloride: 108 mmol/L (ref 98–111)
Creatinine, Ser: 0.78 mg/dL (ref 0.44–1.00)
GFR, Estimated: >60 mL/min (ref >60)
Glucose, Bld: 135 mg/dL — ABNORMAL HIGH (ref 70–99)
Potassium: 3.8 mmol/L (ref 3.5–5.1)
Sodium: 136 mmol/L (ref 135–145)
Total Bilirubin: 0.5 mg/dL (ref 0.3–1.2)
Total Protein: 7.1 g/dL (ref 6.5–8.1)

## 2022-05-16 LAB — LIPASE, BLOOD: Lipase: 28 U/L (ref 11–51)

## 2022-05-16 NOTE — ED Provider Notes (Signed)
[facility]  Provider Note  CSN: [id number] Arrival date & time: 05/15/22 2310  History Chief Complaint  Patient presents with   Abdominal Pain    Beverly Erickson is a 51 y.o. female with history of constipation reports she has had about 24 hours of waxing and waning, diffuse cramping abdominal pain. She had a small hard BM yesterday morning. Some nausea but no vomiting or fever. She took some laxatives earlier in the evening and had a large BM on arrival to the ED with relief in her symptoms. Currently sleeping soundly in no distress.    Home Medications Prior to Admission medications   Medication Sig Start Date End Date Taking? Authorizing Provider  albuterol (PROVENTIL HFA;VENTOLIN HFA) 108 (90 Base) MCG/ACT inhaler Inhale 2 puffs into the lungs every 6 (six) hours as needed for wheezing or shortness of breath. 07/22/18   Mosie Lukes, MD  albuterol (VENTOLIN HFA) 108 (90 Base) MCG/ACT inhaler Inhale 2 puffs into the lungs every 6 (six) hours as needed for wheezing or shortness of breath. 07/01/20   Copland, Gay Filler, MD  aspirin 81 MG tablet Take 81 mg by mouth daily.    [provider]  cetirizine (ZYRTEC) 10 MG tablet Take 1 tablet (10 mg total) by mouth daily. 09/06/18   Mosie Lukes, MD  citalopram (CELEXA) 20 MG tablet Take 1/2 tablet by mouth once daily for 1 week, then increase to a full tab once daily at bedtime 12/19/21   [name], NP  fluticasone (FLONASE) 50 MCG/ACT nasal spray Place 2 sprays into both nostrils daily. 09/06/18   Mosie Lukes, MD  HYDROcodone-acetaminophen (NORCO) 7.5-325 MG tablet Take 1 tablet by mouth 3 (three) times daily as needed for moderate pain. 05/10/22 11/06/22  Mosie Lukes, MD  KRILL OIL PO Take 1 capsule by mouth daily.    [provider]  nitroGLYCERIN (NITROSTAT) 0.4 MG SL tablet Place 1 tablet (0.4 mg total) under the tongue every 5 (five) minutes as needed for chest pain. 06/07/17    Mosie Lukes, MD  omeprazole (PRILOSEC) 40 MG capsule Take 1 capsule (40 mg total) by mouth 2 (two) times daily. 04/10/22   Mosie Lukes, MD  Potassium 75 MG TABS Take 1 each by mouth at bedtime.    [provider]  pramipexole (MIRAPEX) 1 MG tablet Take 1 tablet (1 mg total) by mouth at bedtime. 08/11/16   Brunetta Jeans, PA-C     Allergies    Codeine and Mucinex [guaifenesin er]   Review of Systems   Review of Systems Please see HPI for pertinent positives and negatives  Physical Exam BP 109/61 (BP Location: Left Arm)   Pulse 61   Temp 97.9 F (36.6 C) (Oral)   Resp 17   SpO2 96%   Physical Exam Vitals and nursing note reviewed.  Constitutional:      Appearance: Normal appearance.  HENT:     Head: Normocephalic and atraumatic.     Nose: Nose normal.     Mouth/Throat:     Mouth: Mucous membranes are moist.  Eyes:     Extraocular Movements: Extraocular movements intact.     Conjunctiva/sclera: Conjunctivae normal.  Cardiovascular:     Rate and Rhythm: Normal rate.  Pulmonary:     Effort: Pulmonary effort is normal.     Breath sounds: Normal breath sounds.  Abdominal:     General: Abdomen is flat.     Palpations:  Abdomen is soft.     Tenderness: There is no abdominal tenderness. There is no guarding. Negative signs include Murphy's sign and McBurney's sign.  Musculoskeletal:        General: No swelling. Normal range of motion.     Cervical back: Neck supple.  Skin:    General: Skin is warm and dry.  Neurological:     General: No focal deficit present.     Mental Status: She is alert.  Psychiatric:        Mood and Affect: Mood normal.     ED Results / Procedures / Treatments   EKG EKG Interpretation  Date/Time:  Monday May 15 2022 23:48:39 EDT Ventricular Rate:  60 PR Interval:  176 QRS Duration: 90 QT Interval:  412 QTC Calculation: 412 R Axis:   80 Text Interpretation: Normal sinus rhythm with sinus arrhythmia Normal ECG When  compared with ECG of 29-Apr-2007 23:46, PREVIOUS ECG IS PRESENT No significant change since last tracing Confirmed by Calvert Cantor [phone]) on 05/16/2022 2:42:12 AM  Procedures Procedures  Medications Ordered in the ED Medications  ondansetron (ZOFRAN-ODT) disintegrating tablet 4 mg (4 mg Oral Given 05/15/22 2342)    Initial Impression and Plan  Patient here with abdominal pain, resolved after bowel movement. Labs done in triage show CBC with mild leukocytosis. CMP and lipase are normal. Not having any urinary symptoms. Currently exam is benign and she is resting comfortably. Recommend she continue to take Miralax BID until regular bowel movements. Follow up with PCP and RTED for any other concerns.   ED Course       MDM Rules/Calculators/A&P Medical Decision Making Problems Addressed: Constipation, unspecified constipation type: acute illness or injury  Amount and/or Complexity of Data Reviewed Labs: ordered. Decision-making details documented in ED Course. ECG/medicine tests: ordered and independent interpretation performed. Decision-making details documented in ED Course.  Risk Prescription drug management.    Final Clinical Impression(s) / ED Diagnoses Final diagnoses:  Constipation, unspecified constipation type    Rx / DC Orders ED Discharge Orders     None        Truddie Hidden, MD 05/16/22 [phone]

## 2022-05-17 ENCOUNTER — Ambulatory Visit (INDEPENDENT_AMBULATORY_CARE_PROVIDER_SITE_OTHER): Payer: Medicare (Managed Care) | Admitting: Family

## 2022-05-17 VITALS — BP 113/63 | HR 82 | Temp 98.9°F | Resp 16 | Wt 231.0 lb

## 2022-05-17 DIAGNOSIS — R1084 Generalized abdominal pain: Secondary | ICD-10-CM | POA: Diagnosis not present

## 2022-05-17 LAB — POCT URINE PREGNANCY: Preg Test, Ur: NEGATIVE

## 2022-05-17 LAB — CBC WITH DIFFERENTIAL/PLATELET
Basophils Absolute: 0 10*3/uL (ref 0.0–0.1)
Basophils Relative: 0.3 % (ref 0.0–3.0)
Eosinophils Absolute: 0.1 10*3/uL (ref 0.0–0.7)
Eosinophils Relative: 0.9 % (ref 0.0–5.0)
HCT: 42.1 % (ref 36.0–46.0)
Hemoglobin: 14.1 g/dL (ref 12.0–15.0)
Lymphocytes Relative: 18.5 % (ref 12.0–46.0)
Lymphs Abs: 1.6 10*3/uL (ref 0.7–4.0)
MCHC: 33.6 g/dL (ref 30.0–36.0)
MCV: 91.3 fl (ref 78.0–100.0)
Monocytes Absolute: 0.5 10*3/uL (ref 0.1–1.0)
Monocytes Relative: 5.7 % (ref 3.0–12.0)
Neutro Abs: 6.5 10*3/uL (ref 1.4–7.7)
Neutrophils Relative %: 74.6 % (ref 43.0–77.0)
Platelets: 229 10*3/uL (ref 150.0–400.0)
RBC: 4.61 Mil/uL (ref 3.87–5.11)
RDW: 13.3 % (ref 11.5–15.5)
WBC: 8.7 10*3/uL (ref 4.0–10.5)

## 2022-05-17 LAB — POC URINALSYSI DIPSTICK (AUTOMATED)
Glucose, UA: NEGATIVE
Ketones, UA: NEGATIVE
Leukocytes, UA: NEGATIVE
Nitrite, UA: NEGATIVE
Protein, UA: NEGATIVE
Spec Grav, UA: 1.025 (ref 1.010–1.025)
Urobilinogen, UA: 0.2 E.U./dL
pH, UA: 5 (ref 5.0–8.0)

## 2022-05-17 LAB — HEMOGLOBIN A1C: Hgb A1c MFr Bld: 5.6 % (ref 4.6–6.5)

## 2022-05-17 NOTE — Progress Notes (Signed)
Subjective:   By signing my name below, I, Beverly Erickson, attest that this documentation has been prepared under the direction and in the presence of Beverly Alar, NP 05/17/2022     Patient ID: Beverly Erickson, female    DOB: 12/29/1970, 51 y.o.   MRN: [id number]  No chief complaint on file.   HPI Patient is in today for follow up visit.  Abdominal Pain: She was in the ER prior to the visit, and states that at the time she did not have many bowel movements, until she reached the hospital. She reports that she felt nauseas and began to vomit. She currently continues to have pain in her abdominal region and she describes it as "tight and hard."  She used to have constipation during her menstrual cycles, but now it occurs more frequently. She normally manages her constipation with a laxative,  but she sometimes does not manage.   She states that she had blood in her stool a month ago and describes it as bright red. Of note, she has not had an appendectomy or a colonoscopy.    Kidney Pain: She reports of having kidney pain but denies dysuria.  Back Pain: She complains of having back pain.  Health Maintenance Due  Topic Date Due   Zoster Vaccines- Shingrix (1 of 2) Never done   PAP SMEAR-Modifier  01/17/2017   COLONOSCOPY (Pts 45-23yr Insurance coverage will need to be confirmed)  Never done   MAMMOGRAM  03/09/2022   INFLUENZA VACCINE  05/16/2022    Past Medical History:  Diagnosis Date   Anemia    Chicken pox    Depression    Depression with anxiety 08/30/2015   GERD (gastroesophageal reflux disease)    Kidney stone 02/13/2014   Right, small   Neck pain, musculoskeletal 11/17/2016   Obesity    Other and unspecified hyperlipidemia 01/14/2014   Otitis media 11/17/2016   Preventative health care 07/05/2014   RSD (reflex sympathetic dystrophy)    Tinea corporis 06/10/2017   Tobacco use disorder 01/14/2014   1ppd   Unspecified constipation 05/25/2014   Urinary frequency 03/06/2017     Past Surgical History:  Procedure Laterality Date   CESAREAN SECTION  2000   plate and screws in left arm  2005   humerus   WISDOM TOOTH EXTRACTION  51yrs old    Family History  Problem Relation Age of Onset   Arthritis Mother        Living   Hypertension Mother    Thrombocytopenia Mother    Hyperlipidemia Father    Hypertension Father    Thrombocytopenia Maternal Grandmother    Stroke Maternal Grandmother    Hypertension Maternal Grandmother    Alcohol abuse Maternal Grandfather    Heart disease Maternal Grandfather    Parkinson's disease Maternal Grandfather    Alcohol abuse Paternal Grandfather    Cancer Paternal Grandfather    Hypothyroidism Daughter    Diabetes Daughter        type 1   Dementia Paternal Grandmother     Social History   Socioeconomic History   Marital status: Married    Spouse name: Not on file   Number of children: 3   Years of education: Not on file   Highest education level: Not on file  Occupational History   Occupation: cScientist, water quality   Comment: Disability, 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   Drug use: No   Sexual activity: Not on file    Comment: lives with son, 61 yo daughter, boyfriend, no dietary  Other Topics Concern   Not on file  Social History Narrative   Children live nearby   Social Determinants of Health   Financial Resource Strain: Medium Risk (07/09/2021)   Overall Financial Resource Strain (CARDIA)    Difficulty of Paying Living Expenses: Somewhat hard  Food Insecurity: Food Insecurity Present (07/09/2021)   Hunger Vital Sign    Worried About Running Out of Food in the Last Year: Sometimes true    Ran Out of Food in the Last Year: Never true  Transportation Needs: No Transportation  Needs (07/09/2021)   PRAPARE - Hydrologist (Medical): No    Lack of Transportation (Non-Medical): No  Physical Activity: Inactive (07/09/2021)   Exercise Vital Sign    Days of Exercise per Week: 0 days    Minutes of Exercise per Session: 0 min  Stress: Stress Concern Present (07/09/2021)   [place]    Feeling of Stress : To some extent  Social Connections: Moderately Isolated (07/09/2021)   Social Connection and Isolation Panel [NHANES]    Frequency of Communication with Friends and Family: More than three times a week    Frequency of Social Gatherings with Friends and Family: More than three times a week    Attends Religious Services: Never    Marine scientist or Organizations: No    Attends Archivist Meetings: Never    Marital Status: Married  Human resources officer Violence: Not At Risk (07/09/2021)   Humiliation, Afraid, Rape, and Kick questionnaire    Fear of Current or Ex-Partner: No    Emotionally Abused: No    Physically Abused: No    Sexually Abused: No    Outpatient Medications Prior to Visit  Medication Sig Dispense Refill   albuterol (PROVENTIL HFA;VENTOLIN HFA) 108 (90 Base) MCG/ACT inhaler Inhale 2 puffs into the lungs every 6 (six) hours as needed for wheezing or shortness of breath. 2 Inhaler 2   albuterol (VENTOLIN HFA) 108 (90 Base) MCG/ACT inhaler Inhale 2 puffs into the lungs every 6 (six) hours as needed for wheezing or shortness of breath. 18 g 2   aspirin 81 MG tablet Take 81 mg by mouth daily.     cetirizine (ZYRTEC) 10 MG tablet Take 1 tablet (10 mg total) by mouth daily. 30 tablet 11   citalopram (CELEXA) 20 MG tablet Take 1/2 tablet by mouth once daily for 1 week, then increase to a full tab once daily at bedtime 30 tablet 0   fluticasone (FLONASE) 50 MCG/ACT nasal spray Place 2 sprays into both nostrils daily. 16 g 6   HYDROcodone-acetaminophen (NORCO)  7.5-325 MG tablet Take 1 tablet by mouth 3 (three) times daily as needed for moderate pain. 90 tablet 0   KRILL OIL PO Take 1 capsule by mouth daily.     nitroGLYCERIN (NITROSTAT) 0.4 MG SL tablet Place 1 tablet (0.4 mg total) under the tongue every 5 (five) minutes as needed for chest pain. 25 tablet 1   omeprazole (PRILOSEC) 40 MG capsule Take 1 capsule (40 mg total) by mouth 2 (two) times  daily. 180 capsule 1   Potassium 75 MG TABS Take 1 each by mouth at bedtime.     pramipexole (MIRAPEX) 1 MG tablet Take 1 tablet (1 mg total) by mouth at bedtime. 30 tablet 2   No facility-administered medications prior to visit.    Allergies  Allergen Reactions   Codeine Nausea Only   Mucinex [Guaifenesin Er] Other (See Comments)    Nightmares    Review of Systems  Gastrointestinal:  Positive for abdominal pain, blood in stool (a month ago), constipation, nausea and vomiting.  Genitourinary:  Negative for dysuria.  Musculoskeletal:  Positive for back pain.       Objective:    Physical Exam Constitutional:      Appearance: Normal appearance. She is not ill-appearing.  HENT:     Head: Normocephalic and atraumatic.     Right Ear: External ear normal.     Left Ear: External ear normal.  Eyes:     Extraocular Movements: Extraocular movements intact.     Right eye: No nystagmus.     Left eye: No nystagmus.     Pupils: Pupils are equal, round, and reactive to light.  Cardiovascular:     Rate and Rhythm: Normal rate and regular rhythm.     Pulses: Normal pulses.     Heart sounds: Normal heart sounds. No murmur heard.    No gallop.  Pulmonary:     Effort: Pulmonary effort is normal. No respiratory distress.     Breath sounds: Normal breath sounds. No wheezing or rales.  Abdominal:     General: Bowel sounds are normal.     Tenderness: There is abdominal tenderness.  Lymphadenopathy:     Cervical: No cervical adenopathy.  Skin:    General: Skin is warm and dry.  Neurological:      Mental Status: She is alert and oriented to person, place, and time.  Psychiatric:        Judgment: Judgment normal.     There were no vitals taken for this visit. Wt Readings from Last 3 Encounters:  12/19/21 230 lb (104.3 kg)  07/28/21 233 lb 12.8 oz (106.1 kg)  07/18/21 235 lb 9.6 oz (106.9 kg)       Assessment & Plan:   Problem List Items Addressed This Visit   None    No orders of the defined types were placed in this encounter.   I, Beverly Erickson, personally preformed the services described in this documentation.  All medical record entries made by the scribe were at my direction and in my presence.  I have reviewed the chart and discharge instructions (if applicable) and agree that the record reflects my personal performance and is accurate and complete. 05/17/2022   I,Tinashe Williams,acting as a scribe for Nance Pear, NP.,have documented all relevant documentation on the behalf of Nance Pear, NP,as directed by  Nance Pear, NP while in the presence of Nance Pear, NP.    Beverly Erickson

## 2022-05-18 DIAGNOSIS — R1084 Generalized abdominal pain: Secondary | ICD-10-CM | POA: Insufficient documentation

## 2022-05-18 LAB — URINE CULTURE
MICRO NUMBER:: 13726344
SPECIMEN QUALITY:: ADEQUATE

## 2022-05-18 NOTE — Assessment & Plan Note (Signed)
New.  I would like to obtain some abdominal imaging for further evaluation.  Labs as below.

## 2022-05-20 ENCOUNTER — Ambulatory Visit (HOSPITAL_BASED_OUTPATIENT_CLINIC_OR_DEPARTMENT_OTHER)
Admission: RE | Admit: 2022-05-20 | Discharge: 2022-05-20 | Disposition: A | Discharge status: Home / Self Care | Payer: Medicare (Managed Care) | Source: Ambulatory Visit | Attending: Family | Admitting: Family

## 2022-05-20 DIAGNOSIS — R109 Unspecified abdominal pain: Secondary | ICD-10-CM | POA: Diagnosis not present

## 2022-05-20 DIAGNOSIS — R1084 Generalized abdominal pain: Secondary | ICD-10-CM | POA: Insufficient documentation

## 2022-05-20 MED ORDER — IOHEXOL 300 MG/ML  SOLN
100.0000 mL | Freq: Once | INTRAMUSCULAR | Status: AC | PRN
  Administered 2022-05-20: 100 mL via INTRAVENOUS

## 2022-05-21 ENCOUNTER — Telehealth: Payer: Self-pay | Admitting: Family

## 2022-05-21 NOTE — Telephone Encounter (Signed)
See mychart.  

## 2022-05-22 ENCOUNTER — Emergency Department (HOSPITAL_COMMUNITY): Payer: Medicare (Managed Care) | Admitting: Anesthesiology

## 2022-05-22 ENCOUNTER — Observation Stay (HOSPITAL_COMMUNITY)
Admission: EM | Admit: 2022-05-22 | Discharge: 2022-05-23 | Disposition: A | Discharge status: Home / Self Care | Payer: Medicare (Managed Care) | Attending: Surgery | Admitting: Surgery

## 2022-05-22 ENCOUNTER — Other Ambulatory Visit: Payer: Self-pay

## 2022-05-22 ENCOUNTER — Encounter (HOSPITAL_COMMUNITY)
Admission: EM | Disposition: A | Discharge status: Home / Self Care | Payer: Self-pay | Source: Home / Self Care | Attending: Emergency Medicine

## 2022-05-22 ENCOUNTER — Encounter (HOSPITAL_COMMUNITY): Payer: Self-pay | Admitting: Emergency Medicine

## 2022-05-22 ENCOUNTER — Emergency Department (HOSPITAL_BASED_OUTPATIENT_CLINIC_OR_DEPARTMENT_OTHER): Payer: Medicare (Managed Care) | Admitting: Anesthesiology

## 2022-05-22 DIAGNOSIS — R1031 Right lower quadrant pain: Secondary | ICD-10-CM | POA: Diagnosis present

## 2022-05-22 DIAGNOSIS — R9431 Abnormal electrocardiogram [ECG] [EKG]: Secondary | ICD-10-CM | POA: Diagnosis not present

## 2022-05-22 DIAGNOSIS — Z79899 Other long term (current) drug therapy: Secondary | ICD-10-CM | POA: Diagnosis not present

## 2022-05-22 DIAGNOSIS — K388 Other specified diseases of appendix: Secondary | ICD-10-CM | POA: Diagnosis not present

## 2022-05-22 DIAGNOSIS — F17210 Nicotine dependence, cigarettes, uncomplicated: Secondary | ICD-10-CM | POA: Diagnosis not present

## 2022-05-22 DIAGNOSIS — K3580 Unspecified acute appendicitis: Secondary | ICD-10-CM | POA: Diagnosis not present

## 2022-05-22 DIAGNOSIS — K37 Unspecified appendicitis: Secondary | ICD-10-CM

## 2022-05-22 DIAGNOSIS — Z8673 Personal history of transient ischemic attack (TIA), and cerebral infarction without residual deficits: Secondary | ICD-10-CM | POA: Insufficient documentation

## 2022-05-22 DIAGNOSIS — Z7982 Long term (current) use of aspirin: Secondary | ICD-10-CM | POA: Diagnosis not present

## 2022-05-22 HISTORY — PX: LAPAROSCOPIC APPENDECTOMY: SHX408

## 2022-05-22 LAB — CBC WITH DIFFERENTIAL/PLATELET
Abs Immature Granulocytes: 0.01 10*3/uL (ref 0.00–0.07)
Basophils Absolute: 0 10*3/uL (ref 0.0–0.1)
Basophils Relative: 1 %
Eosinophils Absolute: 0.2 10*3/uL (ref 0.0–0.5)
Eosinophils Relative: 3 %
HCT: 42.3 % (ref 36.0–46.0)
Hemoglobin: 13.8 g/dL (ref 12.0–15.0)
Immature Granulocytes: 0 %
Lymphocytes Relative: 44 %
Lymphs Abs: 3.3 10*3/uL (ref 0.7–4.0)
MCH: 30.6 pg (ref 26.0–34.0)
MCHC: 32.6 g/dL (ref 30.0–36.0)
MCV: 93.8 fL (ref 80.0–100.0)
Monocytes Absolute: 0.5 10*3/uL (ref 0.1–1.0)
Monocytes Relative: 7 %
Neutro Abs: 3.5 10*3/uL (ref 1.7–7.7)
Neutrophils Relative %: 45 %
Platelets: 246 10*3/uL (ref 150–400)
RBC: 4.51 MIL/uL (ref 3.87–5.11)
RDW: 12.6 % (ref 11.5–15.5)
WBC: 7.6 10*3/uL (ref 4.0–10.5)
nRBC: 0 % (ref 0.0–0.2)

## 2022-05-22 LAB — URINALYSIS, ROUTINE W REFLEX MICROSCOPIC
Bacteria, UA: NONE SEEN
Bilirubin Urine: NEGATIVE
Glucose, UA: NEGATIVE mg/dL
Ketones, ur: NEGATIVE mg/dL
Leukocytes,Ua: NEGATIVE
Nitrite: NEGATIVE
Protein, ur: NEGATIVE mg/dL
Specific Gravity, Urine: 1.026 (ref 1.005–1.030)
pH: 6 (ref 5.0–8.0)

## 2022-05-22 SURGERY — APPENDECTOMY, LAPAROSCOPIC
Anesthesia: General

## 2022-05-22 MED ORDER — ENOXAPARIN SODIUM 40 MG/0.4ML IJ SOSY
40.0000 mg | PREFILLED_SYRINGE | INTRAMUSCULAR | Status: DC
Start: 2022-05-23 — End: 2022-05-23
  Filled 2022-05-22: qty 0.4

## 2022-05-22 MED ORDER — DEXAMETHASONE SODIUM PHOSPHATE 10 MG/ML IJ SOLN
INTRAMUSCULAR | Status: DC | PRN
  Administered 2022-05-22: 5 mg via INTRAVENOUS

## 2022-05-22 MED ORDER — ROCURONIUM BROMIDE 10 MG/ML (PF) SYRINGE
PREFILLED_SYRINGE | INTRAVENOUS | Status: AC
  Filled 2022-05-22: qty 10

## 2022-05-22 MED ORDER — KETOROLAC TROMETHAMINE 30 MG/ML IJ SOLN
INTRAMUSCULAR | Status: AC
  Filled 2022-05-22: qty 1

## 2022-05-22 MED ORDER — DIPHENHYDRAMINE HCL 50 MG/ML IJ SOLN
25.0000 mg | Freq: Four times a day (QID) | INTRAMUSCULAR | Status: DC | PRN
  Administered 2022-05-23: 25 mg via INTRAVENOUS
  Filled 2022-05-22: qty 1

## 2022-05-22 MED ORDER — FENTANYL CITRATE (PF) 250 MCG/5ML IJ SOLN
INTRAMUSCULAR | Status: AC
  Filled 2022-05-22: qty 5

## 2022-05-22 MED ORDER — BUPIVACAINE HCL (PF) 0.5 % IJ SOLN
INTRAMUSCULAR | Status: DC | PRN
  Administered 2022-05-22: 26 mL

## 2022-05-22 MED ORDER — PROPOFOL 10 MG/ML IV BOLUS
INTRAVENOUS | Status: DC | PRN
  Administered 2022-05-22: 200 mg via INTRAVENOUS

## 2022-05-22 MED ORDER — OXYCODONE HCL 5 MG PO TABS
5.0000 mg | ORAL_TABLET | ORAL | Status: DC | PRN
  Administered 2022-05-23 (×2): 5 mg via ORAL
  Filled 2022-05-22 (×2): qty 1

## 2022-05-22 MED ORDER — AMISULPRIDE (ANTIEMETIC) 5 MG/2ML IV SOLN
10.0000 mg | Freq: Once | INTRAVENOUS | Status: AC | PRN
  Administered 2022-05-22: 10 mg via INTRAVENOUS

## 2022-05-22 MED ORDER — MIDAZOLAM HCL 2 MG/2ML IJ SOLN
INTRAMUSCULAR | Status: DC | PRN
  Administered 2022-05-22: 2 mg via INTRAVENOUS

## 2022-05-22 MED ORDER — ACETAMINOPHEN 10 MG/ML IV SOLN
INTRAVENOUS | Status: DC | PRN
  Administered 2022-05-22: 1000 mg via INTRAVENOUS

## 2022-05-22 MED ORDER — ONDANSETRON 4 MG PO TBDP: 4.0000 mg | ORAL_TABLET | Freq: Four times a day (QID) | ORAL | Status: DC | PRN

## 2022-05-22 MED ORDER — ONDANSETRON HCL 4 MG/2ML IJ SOLN
INTRAMUSCULAR | Status: DC | PRN
  Administered 2022-05-22: 4 mg via INTRAVENOUS

## 2022-05-22 MED ORDER — FENTANYL CITRATE PF 50 MCG/ML IJ SOSY
25.0000 ug | PREFILLED_SYRINGE | INTRAMUSCULAR | Status: DC | PRN
  Administered 2022-05-22 (×2): 50 ug via INTRAVENOUS

## 2022-05-22 MED ORDER — AMISULPRIDE (ANTIEMETIC) 5 MG/2ML IV SOLN
INTRAVENOUS | Status: AC
  Filled 2022-05-22: qty 4

## 2022-05-22 MED ORDER — ALBUTEROL SULFATE (2.5 MG/3ML) 0.083% IN NEBU: 2.5000 mg | INHALATION_SOLUTION | Freq: Four times a day (QID) | RESPIRATORY_TRACT | Status: DC | PRN

## 2022-05-22 MED ORDER — SODIUM CHLORIDE 0.9 % IV SOLN
2.0000 g | Freq: Once | INTRAVENOUS | Status: AC
  Administered 2022-05-22: 2 g via INTRAVENOUS
  Filled 2022-05-22: qty 20

## 2022-05-22 MED ORDER — LIDOCAINE HCL (CARDIAC) PF 100 MG/5ML IV SOSY
PREFILLED_SYRINGE | INTRAVENOUS | Status: DC | PRN
  Administered 2022-05-22: 70 mg via INTRAVENOUS

## 2022-05-22 MED ORDER — HYDROMORPHONE HCL 1 MG/ML IJ SOLN
1.0000 mg | INTRAMUSCULAR | Status: DC | PRN
  Administered 2022-05-22 (×2): 1 mg via INTRAVENOUS
  Filled 2022-05-22 (×3): qty 1

## 2022-05-22 MED ORDER — MIDAZOLAM HCL 2 MG/2ML IJ SOLN
INTRAMUSCULAR | Status: AC
  Filled 2022-05-22: qty 2

## 2022-05-22 MED ORDER — DEXAMETHASONE SODIUM PHOSPHATE 10 MG/ML IJ SOLN
INTRAMUSCULAR | Status: AC
  Filled 2022-05-22: qty 1

## 2022-05-22 MED ORDER — METRONIDAZOLE 500 MG/100ML IV SOLN
INTRAVENOUS | Status: DC | PRN
  Administered 2022-05-22: 500 mg via INTRAVENOUS

## 2022-05-22 MED ORDER — ONDANSETRON HCL 4 MG/2ML IJ SOLN
INTRAMUSCULAR | Status: AC
Start: 2022-05-22 — End: ?
  Filled 2022-05-22: qty 2

## 2022-05-22 MED ORDER — FENTANYL CITRATE (PF) 250 MCG/5ML IJ SOLN
INTRAMUSCULAR | Status: DC | PRN
  Administered 2022-05-22 (×3): 50 ug via INTRAVENOUS
  Administered 2022-05-22: 100 ug via INTRAVENOUS

## 2022-05-22 MED ORDER — LACTATED RINGERS IR SOLN
Status: DC | PRN
  Administered 2022-05-22: 1000 mL

## 2022-05-22 MED ORDER — LIDOCAINE HCL (PF) 2 % IJ SOLN
INTRAMUSCULAR | Status: AC
  Filled 2022-05-22: qty 5

## 2022-05-22 MED ORDER — PROPOFOL 10 MG/ML IV BOLUS
INTRAVENOUS | Status: AC
  Filled 2022-05-22: qty 20

## 2022-05-22 MED ORDER — ACETAMINOPHEN 10 MG/ML IV SOLN
INTRAVENOUS | Status: AC
  Filled 2022-05-22: qty 100

## 2022-05-22 MED ORDER — LACTATED RINGERS IV SOLN: INTRAVENOUS | Status: DC

## 2022-05-22 MED ORDER — CHLORHEXIDINE GLUCONATE 0.12 % MT SOLN
15.0000 mL | Freq: Once | OROMUCOSAL | Status: AC
  Administered 2022-05-22: 15 mL via OROMUCOSAL

## 2022-05-22 MED ORDER — FENTANYL CITRATE PF 50 MCG/ML IJ SOSY
PREFILLED_SYRINGE | INTRAMUSCULAR | Status: AC
  Filled 2022-05-22: qty 1

## 2022-05-22 MED ORDER — ALBUTEROL SULFATE HFA 108 (90 BASE) MCG/ACT IN AERS: 2.0000 | INHALATION_SPRAY | Freq: Four times a day (QID) | RESPIRATORY_TRACT | Status: DC | PRN

## 2022-05-22 MED ORDER — ONDANSETRON HCL 4 MG/2ML IJ SOLN
4.0000 mg | Freq: Four times a day (QID) | INTRAMUSCULAR | Status: DC | PRN
  Administered 2022-05-22: 4 mg via INTRAVENOUS
  Filled 2022-05-22: qty 2

## 2022-05-22 MED ORDER — DIPHENHYDRAMINE HCL 25 MG PO CAPS: 25.0000 mg | ORAL_CAPSULE | Freq: Four times a day (QID) | ORAL | Status: DC | PRN

## 2022-05-22 MED ORDER — METRONIDAZOLE 500 MG/100ML IV SOLN
500.0000 mg | Freq: Once | INTRAVENOUS | Status: AC
  Administered 2022-05-22: 500 mg via INTRAVENOUS
  Filled 2022-05-22: qty 100

## 2022-05-22 MED ORDER — SUCCINYLCHOLINE CHLORIDE 200 MG/10ML IV SOSY
PREFILLED_SYRINGE | INTRAVENOUS | Status: DC | PRN
  Administered 2022-05-22: 120 mg via INTRAVENOUS

## 2022-05-22 MED ORDER — SODIUM CHLORIDE 0.9 % IV SOLN
INTRAVENOUS | Status: DC
Start: 2022-05-22 — End: 2022-05-23

## 2022-05-22 MED ORDER — ACETAMINOPHEN 500 MG PO TABS
1000.0000 mg | ORAL_TABLET | Freq: Four times a day (QID) | ORAL | Status: DC
  Administered 2022-05-23: 1000 mg via ORAL
  Filled 2022-05-22: qty 2

## 2022-05-22 MED ORDER — KETOROLAC TROMETHAMINE 30 MG/ML IJ SOLN
INTRAMUSCULAR | Status: DC | PRN
  Administered 2022-05-22: 30 mg via INTRAVENOUS

## 2022-05-22 MED ORDER — SUCCINYLCHOLINE CHLORIDE 200 MG/10ML IV SOSY
PREFILLED_SYRINGE | INTRAVENOUS | Status: AC
  Filled 2022-05-22: qty 10

## 2022-05-22 MED ORDER — SUGAMMADEX SODIUM 200 MG/2ML IV SOLN
INTRAVENOUS | Status: DC | PRN
  Administered 2022-05-22: 200 mg via INTRAVENOUS

## 2022-05-22 MED ORDER — BUPIVACAINE HCL (PF) 0.5 % IJ SOLN
INTRAMUSCULAR | Status: AC
  Filled 2022-05-22: qty 30

## 2022-05-22 MED ORDER — ROCURONIUM BROMIDE 10 MG/ML (PF) SYRINGE
PREFILLED_SYRINGE | INTRAVENOUS | Status: DC | PRN
  Administered 2022-05-22: 40 mg via INTRAVENOUS

## 2022-05-22 SURGICAL SUPPLY — 37 items
APPLIER CLIP 5 13 M/L LIGAMAX5 (MISCELLANEOUS)
BAG COUNTER SPONGE SURGICOUNT (BAG) IMPLANT
CHLORAPREP W/TINT 26 (MISCELLANEOUS) ×2 IMPLANT
CLIP APPLIE 5 13 M/L LIGAMAX5 (MISCELLANEOUS) IMPLANT
COVER SURGICAL LIGHT HANDLE (MISCELLANEOUS) ×2 IMPLANT
CUTTER FLEX LINEAR 45M (STAPLE) ×1 IMPLANT
DERMABOND ADVANCED (GAUZE/BANDAGES/DRESSINGS) ×1
DERMABOND ADVANCED .7 DNX12 (GAUZE/BANDAGES/DRESSINGS) ×1 IMPLANT
DRAPE LAPAROSCOPIC ABDOMINAL (DRAPES) ×2 IMPLANT
ELECT COAG MONOPOLAR (ELECTROSURGICAL) ×2
ELECT REM PT RETURN 15FT ADLT (MISCELLANEOUS) ×2 IMPLANT
ELECTRODE COAG MONOPOLAR (ELECTROSURGICAL) ×1 IMPLANT
GLOVE BIO SURGEON STRL SZ7.5 (GLOVE) ×7 IMPLANT
GOWN STRL REUS W/ TWL XL LVL3 (GOWN DISPOSABLE) ×2 IMPLANT
GOWN STRL REUS W/TWL XL LVL3 (GOWN DISPOSABLE) ×2
IRRIG SUCT STRYKERFLOW 2 WTIP (MISCELLANEOUS) ×2
IRRIGATION SUCT STRKRFLW 2 WTP (MISCELLANEOUS) ×1 IMPLANT
KIT BASIN OR (CUSTOM PROCEDURE TRAY) ×2 IMPLANT
KIT TURNOVER KIT A (KITS) ×1 IMPLANT
PENCIL SMOKE EVACUATOR (MISCELLANEOUS) ×1 IMPLANT
RELOAD 45 VASCULAR/THIN (ENDOMECHANICALS) IMPLANT
RELOAD STAPLE 45 2.5 WHT GRN (ENDOMECHANICALS) IMPLANT
RELOAD STAPLE 45 3.5 BLU ETS (ENDOMECHANICALS) IMPLANT
RELOAD STAPLE TA45 3.5 REG BLU (ENDOMECHANICALS) ×2 IMPLANT
SET TUBE SMOKE EVAC HIGH FLOW (TUBING) ×2 IMPLANT
SHEARS HARMONIC ACE PLUS 36CM (ENDOMECHANICALS) ×1 IMPLANT
SLEEVE Z-THREAD 5X100MM (TROCAR) ×2 IMPLANT
SPIKE FLUID TRANSFER (MISCELLANEOUS) ×1 IMPLANT
SUT MNCRL AB 4-0 PS2 18 (SUTURE) ×2 IMPLANT
SYS BAG RETRIEVAL 10MM (BASKET) ×2
SYSTEM BAG RETRIEVAL 10MM (BASKET) ×1 IMPLANT
TOWEL OR 17X26 10 PK STRL BLUE (TOWEL DISPOSABLE) ×2 IMPLANT
TOWEL OR NON WOVEN STRL DISP B (DISPOSABLE) ×2 IMPLANT
TRAY FOLEY MTR SLVR 16FR STAT (SET/KITS/TRAYS/PACK) ×1 IMPLANT
TRAY LAPAROSCOPIC (CUSTOM PROCEDURE TRAY) ×2 IMPLANT
TROCAR BALLN 12MMX100 BLUNT (TROCAR) ×2 IMPLANT
TROCAR Z-THREAD OPTICAL 5X100M (TROCAR) ×2 IMPLANT

## 2022-05-22 NOTE — ED Triage Notes (Signed)
Pt endorses severe abd pain that started Monday but has been having mild abd pain for awhile. Pt had CT scan Saturday that showed mild appendicitis.

## 2022-05-22 NOTE — Op Note (Signed)
APPENDECTOMY LAPAROSCOPIC  Procedure Note  Beverly Erickson 05/22/2022   Pre-op Diagnosis: acute appendicitis     Post-op Diagnosis: same  Procedure(s): APPENDECTOMY LAPAROSCOPIC  Surgeon(s): Coralie Keens, MD  Anesthesia: General  Staff:  Circulator: Lurena Joiner, RN Scrub Person: Romero Liner, CST  Estimated Blood Loss: Minimal               Specimens: sent to path  Indications: This is a 51 year old female who was found on CT scan 2 days ago to have findings consistent with appendicitis.  She was called today and told to go to the emergency department.  She presented with right lower quadrant tenderness.  The decision was made to proceed to the operating room for an appendectomy  Findings: The patient was found to have acute appendicitis.  It was retrocecal in location.  There was no evidence of necrosis or perforation  Procedure: The patient was brought to the operating room identified as a correct patient.  She was placed upon the operating table general anesthesia was induced.  Her abdomen was then prepped and draped in usual sterile fashion.  Using a scalpel I made a vertical incision just below the umbilicus.  I carried this down to a umbilical hernia defect.  The hernia contained preperitoneal fat which was excised.  I then grasped the edges of the fascia openings with clamps and opened up the fascia slightly further and gained interest in the peritoneal cavity.  I then placed a 0 Vicryl pursing suture around the fascial opening.  The [facility] port was placed the opening insufflation of the abdomen was begun.  I placed a 5 mm trocar in the patient's right upper quadrant and another in the left lower quadrant both under direct vision.  The appendix was found to be retrocecal.  I was able to elevate it after grasping it and pulling it out from behind the colon.  It appeared acutely inflamed but there was no necrosis or evidence of perforation.  The mesoappendix was  taken down with a harmonic scalpel.  I then transected the base of the appendix with the laparoscopic GIA stapler.  The appendix was then placed in an Endo sac and removed through the incision at the umbilicus.  I then irrigated the abdomen and pelvis with saline.  Hemostasis appeared to be achieved.  All ports were then removed under direct vision and the abdomen was deflated.  The 0 Vicryl of the umbilicus was tied in place closing the fascial defect.  All incisions were anesthetized with Marcaine and closed with 4-0 Monocryl sutures.  Dermabond was then applied.  The patient tolerated procedure well.  All the counts were correct at the end of the procedure.  The patient was then extubated in the operating room and taken in a stable condition to the recovery room.          Coralie Keens   Date: 05/22/2022  Time: 7:54 PM

## 2022-05-22 NOTE — ED Provider Notes (Signed)
[facility] DEPT Provider Note   CSN: [id number] Arrival date & time: 05/22/22  1607     History  Chief Complaint  Patient presents with   Abdominal Pain    Markia Kyer is a 51 y.o. female.   Abdominal Pain Patient presents with abdominal pain.  Has had for about a week now.  4 days ago went to PCP.  Had outpatient CT scan ordered that was done 2 days ago.  2 days ago apparently came back as an appendicitis.  Patient states she saw the report but was waiting on the call from the provider.  Provider sent a MyChart message yesterday but reportedly was not seen by patient until his call today.  Comes in now.  Has continued lower abdominal pain.  No fevers.  Slight diarrhea at times.  No nausea or vomiting.  Pain is diffuse. Patient smokes about a pack a day.  States she ate some Gummies just prior to coming here.   Past Medical History:  Diagnosis Date   Anemia    Chicken pox    Depression    Depression with anxiety 08/30/2015   GERD (gastroesophageal reflux disease)    Kidney stone 02/13/2014   Right, small   Neck pain, musculoskeletal 11/17/2016   Obesity    Other and unspecified hyperlipidemia 01/14/2014   Otitis media 11/17/2016   Preventative health care 07/05/2014   RSD (reflex sympathetic dystrophy)    Tinea corporis 06/10/2017   Tobacco use disorder 01/14/2014   1ppd   Unspecified constipation 05/25/2014   Urinary frequency 03/06/2017   Home Medications Prior to Admission medications   Medication Sig Start Date End Date Taking? Authorizing Provider  albuterol (PROVENTIL HFA;VENTOLIN HFA) 108 (90 Base) MCG/ACT inhaler Inhale 2 puffs into the lungs every 6 (six) hours as needed for wheezing or shortness of breath. 07/22/18   Mosie Lukes, MD  albuterol (VENTOLIN HFA) 108 (90 Base) MCG/ACT inhaler Inhale 2 puffs into the lungs every 6 (six) hours as needed for wheezing or shortness of breath. 07/01/20   Copland, Gay Filler, MD  aspirin 81 MG tablet  Take 81 mg by mouth daily.    [provider]  cetirizine (ZYRTEC) 10 MG tablet Take 1 tablet (10 mg total) by mouth daily. 09/06/18   Mosie Lukes, MD  citalopram (CELEXA) 20 MG tablet Take 1/2 tablet by mouth once daily for 1 week, then increase to a full tab once daily at bedtime 12/19/21   [name], NP  fluticasone (FLONASE) 50 MCG/ACT nasal spray Place 2 sprays into both nostrils daily. 09/06/18   Mosie Lukes, MD  HYDROcodone-acetaminophen (NORCO) 7.5-325 MG tablet Take 1 tablet by mouth 3 (three) times daily as needed for moderate pain. 05/10/22 11/06/22  Mosie Lukes, MD  KRILL OIL PO Take 1 capsule by mouth daily.    [provider]  nitroGLYCERIN (NITROSTAT) 0.4 MG SL tablet Place 1 tablet (0.4 mg total) under the tongue every 5 (five) minutes as needed for chest pain. 06/07/17   Mosie Lukes, MD  omeprazole (PRILOSEC) 40 MG capsule Take 1 capsule (40 mg total) by mouth 2 (two) times daily. 04/10/22   Mosie Lukes, MD  Potassium 75 MG TABS Take 1 each by mouth at bedtime.    [provider]  pramipexole (MIRAPEX) 1 MG tablet Take 1 tablet (1 mg total) by mouth at bedtime. 08/11/16   Brunetta Jeans, PA-C      Allergies  Codeine and Mucinex [guaifenesin er]    Review of Systems   Review of Systems  Gastrointestinal:  Positive for abdominal pain.    Physical Exam Updated Vital Signs BP 139/80   Pulse 67   Temp 97.9 F (36.6 C) (Oral)   Resp 16   Ht '5\' 8"'$  (1.727 m)   Wt 104 kg   SpO2 100%   BMI 34.86 kg/m  Physical Exam Vitals and nursing note reviewed.  HENT:     Head: Atraumatic.  Cardiovascular:     Rate and Rhythm: Normal rate.  Abdominal:     Comments: Lower abdominal tenderness rebound or guarding.  No hernia palpated.   Neurological:     Mental Status: She is alert.     ED Results / Procedures / Treatments   Labs (all labs ordered are listed, but only abnormal results are displayed) Labs Reviewed   COMPREHENSIVE METABOLIC PANEL  CBC WITH DIFFERENTIAL/PLATELET  URINALYSIS, ROUTINE W REFLEX MICROSCOPIC    EKG None  Radiology No results found.  Procedures Procedures    Medications Ordered in ED Medications  cefTRIAXone (ROCEPHIN) 2 g in sodium chloride 0.9 % 100 mL IVPB (has no administration in time range)    And  metroNIDAZOLE (FLAGYL) IVPB 500 mg (has no administration in time range)    ED Course/ Medical Decision Making/ A&P                           Medical Decision Making Amount and/or Complexity of Data Reviewed Labs: ordered.  Risk Prescription drug management.   Patient with lower abdominal pain.  Has had for about a week now.  Has been seen by PCP and review I have reviewed the lab work that 4 days ago was reassuring.  2 days ago had CT scan done that showed appendicitis.  Now comes in.  Continued dull pain.  No fevers.  Does not appear septic.  High in the differential diagnosis has to be the appendicitis that was seen on the CT scan.  Will repeat blood work and get antibiotics going.  Is at somewhat high risk because she does smoke a pack a day and does have chronic pain medicines.  Will discuss with general surgery.        Final Clinical Impression(s) / ED Diagnoses Final diagnoses:  Acute appendicitis, unspecified acute appendicitis type    Rx / DC Orders ED Discharge Orders     None         Davonna Belling, MD 05/22/22 1645

## 2022-05-22 NOTE — Anesthesia Procedure Notes (Signed)
Procedure Name: Intubation Date/Time: 05/22/2022 7:14 PM  Performed by: Raenette Rover, CRNAPre-anesthesia Checklist: Patient identified, Emergency Drugs available, Suction available and Patient being monitored Patient Re-evaluated:Patient Re-evaluated prior to induction Oxygen Delivery Method: Circle system utilized Preoxygenation: Pre-oxygenation with 100% oxygen Induction Type: IV induction, Rapid sequence and Cricoid Pressure applied Laryngoscope Size: Mac and 3 Grade View: Grade II Tube type: Oral Tube size: 7.0 mm Number of attempts: 1 Airway Equipment and Method: Stylet Placement Confirmation: ETT inserted through vocal cords under direct vision, positive ETCO2 and breath sounds checked- equal and bilateral Secured at: 22 cm Tube secured with: Tape Dental Injury: Teeth and Oropharynx as per pre-operative assessment

## 2022-05-22 NOTE — Telephone Encounter (Signed)
Spoke with pt. She did not read my chart or listen to voicemail I left yesterday. Reviewed CT results and advised the patient to go to the ER now for further evaluation. She verbalizes understanding.

## 2022-05-22 NOTE — H&P (Signed)
CC: Right lower quadrant abdominal pain  HPI: This is a 51 year old female presents with a 1 week history of abdominal pain.  She was seen in the emergency department last Monday and was felt to have constipation.  Her pain persisted in the right lower quadrant so she presented to her primary care provider.  An outpatient CT scan was ordered.  The CT scan was performed on August 5 but the patient was not notified until today that she had appendicitis and was sent to the emergency department.  She reports she had 1 episode of emesis.  Her pain is mild to moderate.  It is sharp in consistency.  She denies fevers or chills.  She is otherwise without complaints.  She does smoke a pack of cigarettes a day but has no other cardiopulmonary issues.  Past Medical History:  Diagnosis Date   Anemia    Chicken pox    Depression    Depression with anxiety 08/30/2015   GERD (gastroesophageal reflux disease)    Kidney stone 02/13/2014   Right, small   Neck pain, musculoskeletal 11/17/2016   Obesity    Other and unspecified hyperlipidemia 01/14/2014   Otitis media 11/17/2016   Preventative health care 07/05/2014   RSD (reflex sympathetic dystrophy)    Tinea corporis 06/10/2017   Tobacco use disorder 01/14/2014   1ppd   Unspecified constipation 05/25/2014   Urinary frequency 03/06/2017    Past Surgical History:  Procedure Laterality Date   CESAREAN SECTION  2000   plate and screws in left arm  2005   humerus   WISDOM TOOTH EXTRACTION  51 yrs old    Family History  Problem Relation Age of Onset   Arthritis Mother        Living   Hypertension Mother    Thrombocytopenia Mother    Hyperlipidemia Father    Hypertension Father    Thrombocytopenia Maternal Grandmother    Stroke Maternal Grandmother    Hypertension Maternal Grandmother    Alcohol abuse Maternal Grandfather    Heart disease Maternal Grandfather    Parkinson's disease Maternal Grandfather    Alcohol abuse Paternal Grandfather     Cancer Paternal Grandfather    Hypothyroidism Daughter    Diabetes Daughter        type 1   Dementia Paternal Grandmother     Social:  reports that she has been smoking cigarettes. She has a 20.00 pack-year smoking history. She has never used smokeless tobacco. She reports current alcohol use. She reports that she does not use drugs.  Allergies:  Allergies  Allergen Reactions   Codeine Nausea Only   Mucinex [Guaifenesin Er] Other (See Comments)    Nightmares    Medications: I have reviewed the patient's current medications.  No results found for this or any previous visit (from the past 48 hour(s)).  No results found.  ROS - all of the below systems have been reviewed with the patient and positives are indicated with bold text General: chills, fever or night sweats Eyes: blurry vision or double vision ENT: epistaxis or sore throat Allergy/Immunology: itchy/watery eyes or nasal congestion Hematologic/Lymphatic: bleeding problems, blood clots or swollen lymph nodes Endocrine: temperature intolerance or unexpected weight changes Breast: new or changing breast lumps or nipple discharge Resp: cough, shortness of breath, or wheezing CV: chest pain or dyspnea on exertion GI: as per HPI GU: dysuria, trouble voiding, or hematuria MSK: joint pain or joint stiffness Neuro: TIA or stroke symptoms Derm: pruritus and  skin lesion changes Psych: anxiety and depression  PE Blood pressure 125/75, pulse (!) 57, temperature 97.9 F (36.6 C), temperature source Oral, resp. rate 18, height '5\' 8"'$  (1.727 m), weight 104 kg, SpO2 100 %. Constitutional: NAD; conversant; no deformities Eyes: Moist conjunctiva; no lid lag; anicteric; PERRL Neck: Trachea midline; no thyromegaly Lungs: Normal respiratory effort; no tactile fremitus CV: RRR; no palpable thrills; no pitting edema GI: Abd tender with guarding in the RLQ which is mild to moderate; no palpable hepatosplenomegaly MSK: Normal range of  motion of extremities; no clubbing/cyanosis Psychiatric: Appropriate affect; alert and oriented x3 Lymphatic: No palpable cervical or axillary lymphadenopathy  No results found for this or any previous visit (from the past 48 hour(s)).  No results found.  I have personally reviewed the relevant CT  A/P: Appendicitis  I have reviewed the CT scan from 2 days ago.  The appendix does look retrocecal in location.  I discussed the diagnosis of appendicitis with the patient.  She reports she was only notified like today that she had appendicitis.  I recommend proceeding with a laparoscopic appendectomy.  I discussed the reasons for this with her.  We discussed the possibility that appendix may be ruptured given the duration of her symptoms and timing since the CT scan.  I discussed the risk which includes but is not limited to bleeding, infection, the need to convert to an open procedure, injury to surrounding structures, appendiceal stump leak, cardiopulmonary issues, postoperative recovery, etc.  IV antibiotics have been started.  After discussion, she wished to proceed with surgery which is scheduled  Complex medical decision making   Coralie Keens, MD [facility], [place]

## 2022-05-22 NOTE — Anesthesia Preprocedure Evaluation (Signed)
Anesthesia Evaluation  Patient identified by MRN, date of birth, ID band Patient awake    Reviewed: Allergy & Precautions, NPO status , Patient's Chart, lab work & pertinent test results  Airway Mallampati: II  TM Distance: >3 FB Neck ROM: Full    Dental   Pulmonary Current Smoker,    breath sounds clear to auscultation       Cardiovascular negative cardio ROS   Rhythm:Regular Rate:Normal     Neuro/Psych  Neuromuscular disease    GI/Hepatic Neg liver ROS, GERD  ,Acute appendicitis    Endo/Other  negative endocrine ROS  Renal/GU Renal disease     Musculoskeletal   Abdominal   Peds  Hematology negative hematology ROS (+)   Anesthesia Other Findings   Reproductive/Obstetrics                             Anesthesia Physical Anesthesia Plan  ASA: 2 and emergent  Anesthesia Plan: General   Post-op Pain Management: Ofirmev IV (intra-op)*   Induction: Intravenous and Rapid sequence  PONV Risk Score and Plan: 2 and Dexamethasone, Ondansetron and Treatment may vary due to age or medical condition  Airway Management Planned: Oral ETT  Additional Equipment: None  Intra-op Plan:   Post-operative Plan: Extubation in OR  Informed Consent: I have reviewed the patients History and Physical, chart, labs and discussed the procedure including the risks, benefits and alternatives for the proposed anesthesia with the patient or authorized representative who has indicated his/her understanding and acceptance.     Dental advisory given  Plan Discussed with: CRNA  Anesthesia Plan Comments:         Anesthesia Quick Evaluation

## 2022-05-22 NOTE — Transfer of Care (Signed)
Immediate Anesthesia Transfer of Care Note  Patient: Beverly Erickson  Procedure(s) Performed: APPENDECTOMY LAPAROSCOPIC  Patient Location: PACU  Anesthesia Type:General  Level of Consciousness: awake, alert , oriented, drowsy and patient cooperative  Airway & Oxygen Therapy: Patient Spontanous Breathing and Patient connected to face mask oxygen  Post-op Assessment: Report given to RN and Post -op Vital signs reviewed and stable  Post vital signs: Reviewed and stable  Last Vitals:  Vitals Value Taken Time  BP 142/84 05/22/22 2001  Temp    Pulse 71 05/22/22 2002  Resp 12 05/22/22 2002  SpO2 98 % 05/22/22 2002  Vitals shown include unvalidated device data.  Last Pain:  Vitals:   05/22/22 1841  TempSrc:   PainSc: 3          Complications: No notable events documented.

## 2022-05-23 ENCOUNTER — Encounter (HOSPITAL_COMMUNITY): Payer: Self-pay | Admitting: Surgery

## 2022-05-23 ENCOUNTER — Encounter (INDEPENDENT_AMBULATORY_CARE_PROVIDER_SITE_OTHER): Payer: Medicare (Managed Care) | Admitting: Family Medicine

## 2022-05-23 DIAGNOSIS — G8918 Other acute postprocedural pain: Secondary | ICD-10-CM | POA: Diagnosis not present

## 2022-05-23 NOTE — Progress Notes (Signed)
PIV's removed. Discharge instructions completed. Patient verbalized understanding of medication regimen, follow up appointments and discharge instructions. Patient belongings gathered and packed to discharge. Will shower and ride will be here at 1200.

## 2022-05-23 NOTE — Discharge Instructions (Signed)
[facility] ° °Please arrive at least 30 min before your appointment to complete your check in paperwork.  If you are unable to arrive 30 min prior to your appointment time we may have to cancel or reschedule you. °LAPAROSCOPIC SURGERY: POST OP INSTRUCTIONS °Always review your discharge instruction sheet given to you by the facility where your surgery was performed. °IF YOU HAVE DISABILITY OR FAMILY LEAVE FORMS, YOU MUST BRING THEM TO THE OFFICE FOR PROCESSING.   °DO NOT GIVE THEM TO YOUR DOCTOR. ° °PAIN CONTROL ° °First take acetaminophen (Tylenol) AND/or ibuprofen (Advil) to control your pain after surgery.  Follow directions on package.  Taking acetaminophen (Tylenol) and/or ibuprofen (Advil) regularly after surgery will help to control your pain and lower the amount of prescription pain medication you may need.  You should not take more than 4,000 mg (4 grams) of acetaminophen (Tylenol) in 24 hours.  You should not take ibuprofen (Advil), aleve, motrin, naprosyn or other NSAIDS if you have a history of stomach ulcers or chronic kidney disease.  °A prescription for pain medication may be given to you upon discharge.  Take your pain medication as prescribed, if you still have uncontrolled pain after taking acetaminophen (Tylenol) or ibuprofen (Advil). °Use ice packs to help control pain. °If you need a refill on your pain medication, please contact your pharmacy.  They will contact our office to request authorization. Prescriptions will not be filled after 5pm or on week-ends. ° °HOME MEDICATIONS °Take your usually prescribed medications unless otherwise directed. ° °DIET °You should follow a light diet the first few days after arrival home.  Be sure to include lots of fluids daily. Avoid fatty, fried foods.  ° °CONSTIPATION °It is common to experience some constipation after surgery and if you are taking pain medication.  Increasing fluid intake and taking a stool softener (such as Colace)  will usually help or prevent this problem from occurring.  A mild laxative (Milk of Magnesia or Miralax) should be taken according to package instructions if there are no bowel movements after 48 hours. ° °WOUND/INCISION CARE °Most patients will experience some swelling and bruising in the area of the incisions.  Ice packs will help.  Swelling and bruising can take several days to resolve.  °Unless discharge instructions indicate otherwise, follow guidelines below  °STERI-STRIPS - you may remove your outer bandages 48 hours after surgery, and you may shower at that time.  You have steri-strips (small skin tapes) in place directly over the incision.  These strips should be left on the skin for 7-10 days.   °DERMABOND/SKIN GLUE - you may shower in 24 hours.  The glue will flake off over the next 2-3 weeks. °Any sutures or staples will be removed at the office during your follow-up visit. ° °ACTIVITIES °You may resume regular (light) daily activities beginning the next day--such as daily self-care, walking, climbing stairs--gradually increasing activities as tolerated.  You may have sexual intercourse when it is comfortable.  Refrain from any heavy lifting or straining until approved by your doctor. °You may drive when you are no longer taking prescription pain medication, you can comfortably wear a seatbelt, and you can safely maneuver your car and apply brakes. ° °FOLLOW-UP °You should see your doctor in the office for a follow-up appointment approximately 2-3 weeks after your surgery.  You should have been given your post-op/follow-up appointment when your surgery was scheduled.  If you did not receive a post-op/follow-up appointment, make sure   that you call for this appointment within a day or two after you arrive home to insure a convenient appointment time. ° ° °WHEN TO CALL YOUR DOCTOR: °Fever over 101.0 °Inability to urinate °Continued bleeding from incision. °Increased pain, redness, or drainage from the  incision. °Increasing abdominal pain ° °The clinic staff is available to answer your questions during regular business hours.  Please don’t hesitate to call and ask to speak to one of the nurses for clinical concerns.  If you have a medical emergency, go to the nearest emergency room or call 911.  A surgeon from [facility] is always on call at the hospital. °[address], [address], [place], [place]  [address] ? [address] [address], [place], [place]   [address] °[phone] ? [phone] ? FAX [phone] ° ° ° ° °Managing Your Pain After Surgery Without Opioids ° ° ° °Thank you for participating in our program to help patients manage their pain after surgery without opioids. This is part of our effort to provide you with the best care possible, without exposing you or your family to the risk that opioids pose. ° °What pain can I expect after surgery? °You can expect to have some pain after surgery. This is normal. The pain is typically worse the day after surgery, and quickly begins to get better. °Many studies have found that many patients are able to manage their pain after surgery with Over-the-Counter (OTC) medications such as Tylenol and Motrin. If you have a condition that does not allow you to take Tylenol or Motrin, notify your surgical team. ° °How will I manage my pain? °The best strategy for controlling your pain after surgery is around the clock pain control with Tylenol (acetaminophen) and Motrin (ibuprofen or Advil). Alternating these medications with each other allows you to maximize your pain control. In addition to Tylenol and Motrin, you can use heating pads or ice packs on your incisions to help reduce your pain. ° °How will I alternate your regular strength over-the-counter pain medication? °You will take a dose of pain medication every three hours. °Start by taking 650 mg of Tylenol (2 pills of 325 mg) °3 hours later take 600 mg of Motrin (3 pills of 200 mg) °3 hours after  taking the Motrin take 650 mg of Tylenol °3 hours after that take 600 mg of Motrin. ° ° °- 1 - ° °See example - if your first dose of Tylenol is at 12:00 PM ° ° °12:00 PM Tylenol 650 mg (2 pills of 325 mg)  °3:00 PM Motrin 600 mg (3 pills of 200 mg)  °6:00 PM Tylenol 650 mg (2 pills of 325 mg)  °9:00 PM Motrin 600 mg (3 pills of 200 mg)  °Continue alternating every 3 hours  ° °We recommend that you follow this schedule around-the-clock for at least 3 days after surgery, or until you feel that it is no longer needed. Use the table on the last page of this handout to keep track of the medications you are taking. °Important: °Do not take more than 3000mg of Tylenol or 3200mg of Motrin in a 24-hour period. °Do not take ibuprofen/Motrin if you have a history of bleeding stomach ulcers, severe kidney disease, &/or actively taking a blood thinner ° °What if I still have pain? °If you have pain that is not controlled with the over-the-counter pain medications (Tylenol and Motrin or Advil) you might have what we call “breakthrough” pain. You will receive a prescription   for a small amount of an opioid pain medication such as Oxycodone, Tramadol, or Tylenol with Codeine. Use these opioid pills in the first 24 hours after surgery if you have breakthrough pain. Do not take more than 1 pill every 4-6 hours. ° °If you still have uncontrolled pain after using all opioid pills, don't hesitate to call our staff using the number provided. We will help make sure you are managing your pain in the best way possible, and if necessary, we can provide a prescription for additional pain medication. ° ° °Day 1   ° °Time  °Name of Medication Number of pills taken  °Amount of Acetaminophen  °Pain Level  ° °Comments  °AM PM       °AM PM       °AM PM       °AM PM       °AM PM       °AM PM       °AM PM       °AM PM       °Total Daily amount of Acetaminophen °Do not take more than  3,000 mg per day    ° ° °Day 2   ° °Time  °Name of Medication  Number of pills °taken  °Amount of Acetaminophen  °Pain Level  ° °Comments  °AM PM       °AM PM       °AM PM       °AM PM       °AM PM       °AM PM       °AM PM       °AM PM       °Total Daily amount of Acetaminophen °Do not take more than  3,000 mg per day    ° ° °Day 3   ° °Time  °Name of Medication Number of pills taken  °Amount of Acetaminophen  °Pain Level  ° °Comments  °AM PM       °AM PM       °AM PM       °AM PM       ° ° ° °AM PM       °AM PM       °AM PM       °AM PM       °Total Daily amount of Acetaminophen °Do not take more than  3,000 mg per day    ° ° °Day 4   ° °Time  °Name of Medication Number of pills taken  °Amount of Acetaminophen  °Pain Level  ° °Comments  °AM PM       °AM PM       °AM PM       °AM PM       °AM PM       °AM PM       °AM PM       °AM PM       °Total Daily amount of Acetaminophen °Do not take more than  3,000 mg per day    ° ° °Day 5   ° °Time  °Name of Medication Number °of pills taken  °Amount of Acetaminophen  °Pain Level  ° °Comments  °AM PM       °AM PM       °AM PM       °AM PM       °AM PM       °AM   PM       °AM PM       °AM PM       °Total Daily amount of Acetaminophen °Do not take more than  3,000 mg per day    ° ° ° °Day 6   ° °Time  °Name of Medication Number of pills °taken  °Amount of Acetaminophen  °Pain Level  °Comments  °AM PM       °AM PM       °AM PM       °AM PM       °AM PM       °AM PM       °AM PM       °AM PM       °Total Daily amount of Acetaminophen °Do not take more than  3,000 mg per day    ° ° °Day 7   ° °Time  °Name of Medication Number of pills taken  °Amount of Acetaminophen  °Pain Level  ° °Comments  °AM PM       °AM PM       °AM PM       °AM PM       °AM PM       °AM PM       °AM PM       °AM PM       °Total Daily amount of Acetaminophen °Do not take more than  3,000 mg per day    ° ° ° ° °For additional information about how and where to safely dispose of unused opioid °medications - https://www.morepowerfulnc.org ° °Disclaimer: This document  contains information and/or instructional materials adapted from [facility] for the typical patient with your condition. It does not replace medical advice from your health care provider because your experience may differ from that of the °typical patient. Talk to your health care provider if you have any questions about this °document, your condition or your treatment plan. °Adapted from [facility] ° °

## 2022-05-23 NOTE — Discharge Summary (Signed)
Patient ID: Beverly Erickson [id number] 1971-05-26 50 y.o.  Admit date: 05/22/2022 Discharge date: 05/23/2022  Admitting Diagnosis: Acute appendicitis  Discharge Diagnosis Patient Active Problem List   Diagnosis Date Noted   Appendicitis 05/22/2022   Generalized abdominal pain 05/18/2022   RUQ pain 12/19/2021   High risk medication use 12/19/2021   Hot flashes 12/19/2021   Contact dermatitis 07/29/2021   Bruise 03/07/2021   Left hip pain 09/14/2020   Allergies 07/14/2020   Fatigue 01/22/2018   Tinea corporis 06/10/2017   Muscle cramp 03/07/2017   Low back pain 03/07/2017   Urinary frequency 03/06/2017   Neck pain, musculoskeletal 11/17/2016   Otitis media 11/17/2016   Breast cancer screening 08/13/2016   Abdominal pain, chronic, epigastric 08/13/2016   Witnessed episode of apnea 08/13/2016   Acute bacterial sinusitis 10/20/2015   Depression with anxiety 08/30/2015   Elevated blood pressure 08/11/2015   Atypical chest pain 05/23/2015   Preventative health care 07/05/2014   Constipation 05/25/2014   Lesion of tonsil 05/03/2014   Transient ischemic attack (TIA) 05/03/2014   Kidney stone 02/13/2014   Mid back pain on right side 02/13/2014   Hyperglycemia 01/14/2014   Hyperlipidemia, mixed 01/14/2014   Obesity    RSD (reflex sympathetic dystrophy)    GERD (gastroesophageal reflux disease) 12/05/2013   Complex regional pain syndrome I of lower limb 02/07/2013    Consultants none  Reason for Admission: This is a 51 year old female presents with a 1 week history of abdominal pain.  She was seen in the emergency department last Monday and was felt to have constipation.  Her pain persisted in the right lower quadrant so she presented to her primary care provider.  An outpatient CT scan was ordered.  The CT scan was performed on August 5 but the patient was not notified until today that she had appendicitis and was sent to the emergency department.  She reports she had 1  episode of emesis.  Her pain is mild to moderate.  It is sharp in consistency.  She denies fevers or chills.  She is otherwise without complaints.  She does smoke a pack of cigarettes a day but has no other cardiopulmonary issues.  Procedures Lap appy, 05/22/22 Dr. Jerilynn Birkenhead Course:  The patient was admitted and underwent a laparoscopic appendectomy.  The patient tolerated the procedure well.  On POD 1, the patient was tolerating a regular diet, voiding well, mobilizing, and pain was controlled with oral pain medications.  The patient was stable for DC home at this time with appropriate follow up made.   Physical Exam: Abd: soft, appropriately tender, incisions c/d/I, ND  Allergies as of 05/23/2022       Reactions   Codeine Nausea Only   Mucinex [guaifenesin Er] Other (See Comments)   Nightmares        Medication List     TAKE these medications    albuterol 108 (90 Base) MCG/ACT inhaler Commonly known as: VENTOLIN HFA Inhale 2 puffs into the lungs every 6 (six) hours as needed for wheezing or shortness of breath.   albuterol 108 (90 Base) MCG/ACT inhaler Commonly known as: VENTOLIN HFA Inhale 2 puffs into the lungs every 6 (six) hours as needed for wheezing or shortness of breath.   cetirizine 10 MG tablet Commonly known as: ZYRTEC Take 1 tablet (10 mg total) by mouth daily.   citalopram 20 MG tablet Commonly known as: CELEXA Take 1/2 tablet by mouth once daily for 1 week,  then increase to a full tab once daily at bedtime   fluticasone 50 MCG/ACT nasal spray Commonly known as: FLONASE Place 2 sprays into both nostrils daily.   HYDROcodone-acetaminophen 7.5-325 MG tablet Commonly known as: NORCO Take 1 tablet by mouth 3 (three) times daily as needed for moderate pain.   ibuprofen 200 MG tablet Commonly known as: ADVIL Take 200-400 mg by mouth every 6 (six) hours as needed for mild pain or headache.   nitroGLYCERIN 0.4 MG SL tablet Commonly known as:  NITROSTAT Place 1 tablet (0.4 mg total) under the tongue every 5 (five) minutes as needed for chest pain.   omeprazole 40 MG capsule Commonly known as: PRILOSEC Take 1 capsule (40 mg total) by mouth 2 (two) times daily. What changed: when to take this   pramipexole 1 MG tablet Commonly known as: Mirapex Take 1 tablet (1 mg total) by mouth at bedtime.          Follow-up Information     Maczis, Carlena Hurl, PA-C Follow up in 3 week(s).   Specialty: General Surgery Why: Office will call you with a follow up appointment, If you don't hear from the office, please call, Arrive 30 minutes prior to your appointment time, Please bring your insurance card and photo ID Contact information: [place] [address] [phone]                 Signed: Saverio Danker, [facility] 05/23/2022, 10:58 AM Please see Amion for pager number during day hours 7:00am-4:30pm, 7-11:30am on Weekends'

## 2022-05-24 ENCOUNTER — Telehealth: Payer: Self-pay | Admitting: Internal Medicine

## 2022-05-24 ENCOUNTER — Other Ambulatory Visit (HOSPITAL_BASED_OUTPATIENT_CLINIC_OR_DEPARTMENT_OTHER): Payer: Self-pay

## 2022-05-24 ENCOUNTER — Other Ambulatory Visit: Payer: Self-pay | Admitting: Internal Medicine

## 2022-05-24 LAB — SURGICAL PATHOLOGY

## 2022-05-24 MED ORDER — OXYCODONE HCL 5 MG PO TABS
5.0000 mg | ORAL_TABLET | Freq: Three times a day (TID) | ORAL | 0 refills | Status: DC | PRN
  Filled 2022-05-24: qty 15, 5d supply, fill #0

## 2022-05-24 MED ORDER — OXYCODONE-ACETAMINOPHEN 2.5-325 MG PO TABS
1.0000 | ORAL_TABLET | Freq: Three times a day (TID) | ORAL | 0 refills | Status: DC | PRN
  Filled 2022-05-24: qty 18, 3d supply, fill #0

## 2022-05-24 NOTE — Telephone Encounter (Signed)
Patient sent message. Had an appendectomy, released home the next day, she has taken Norco 3 times daily for a while, at this point, the medication is controlling the pain only for 2-1/2 hours. On the hospital she got oxycodone with better results. Plan: For the next 3 days hold Norco Take oxycodone 1 or 2 tablets every 8 hours as needed.  Do not add plain Tylenol, this medication has Tylenol in it already. Watch for constipation. Call surgery if pain increases or for further advice.

## 2022-05-24 NOTE — Telephone Encounter (Signed)
Spoke w/ Pt- informed of recommendations. Pt verbalized understanding.  

## 2022-05-24 NOTE — Anesthesia Postprocedure Evaluation (Signed)
Anesthesia Post Note  Patient: Beverly Erickson  Procedure(s) Performed: APPENDECTOMY LAPAROSCOPIC     Patient location during evaluation: PACU Anesthesia Type: General Level of consciousness: awake and alert Pain management: pain level controlled Vital Signs Assessment: post-procedure vital signs reviewed and stable Respiratory status: spontaneous breathing, nonlabored ventilation, respiratory function stable and patient connected to nasal cannula oxygen Cardiovascular status: blood pressure returned to baseline and stable Postop Assessment: no apparent nausea or vomiting Anesthetic complications: no   No notable events documented.  Last Vitals:  Vitals:   05/23/22 0542 05/23/22 0839  BP: 116/67 113/71  Pulse: (!) 50 (!) 55  Resp: 18 18  Temp: 36.8 C 36.7 C  SpO2: 98% 99%    Last Pain:  Vitals:   05/23/22 0918  TempSrc:   PainSc: 3                  [name]

## 2022-05-24 NOTE — Telephone Encounter (Signed)
Ivin Booty, my nurse call your with  instructions earlier today, if you have any questions please let us know

## 2022-05-28 ENCOUNTER — Emergency Department (HOSPITAL_BASED_OUTPATIENT_CLINIC_OR_DEPARTMENT_OTHER)
Admission: EM | Admit: 2022-05-28 | Discharge: 2022-05-29 | Disposition: A | Discharge status: Home / Self Care | Payer: Medicare (Managed Care) | Attending: Emergency Medicine | Admitting: Emergency Medicine

## 2022-05-28 ENCOUNTER — Emergency Department (HOSPITAL_BASED_OUTPATIENT_CLINIC_OR_DEPARTMENT_OTHER): Payer: Medicare (Managed Care)

## 2022-05-28 ENCOUNTER — Encounter (HOSPITAL_BASED_OUTPATIENT_CLINIC_OR_DEPARTMENT_OTHER): Payer: Self-pay | Admitting: Emergency Medicine

## 2022-05-28 ENCOUNTER — Encounter: Payer: Self-pay | Admitting: Family Medicine

## 2022-05-28 DIAGNOSIS — R1013 Epigastric pain: Secondary | ICD-10-CM | POA: Diagnosis present

## 2022-05-28 DIAGNOSIS — R1031 Right lower quadrant pain: Secondary | ICD-10-CM | POA: Diagnosis not present

## 2022-05-28 DIAGNOSIS — R1084 Generalized abdominal pain: Secondary | ICD-10-CM | POA: Diagnosis not present

## 2022-05-28 DIAGNOSIS — G8918 Other acute postprocedural pain: Secondary | ICD-10-CM | POA: Insufficient documentation

## 2022-05-28 LAB — COMPREHENSIVE METABOLIC PANEL
ALT: 21 U/L (ref 0–44)
AST: 19 U/L (ref 15–41)
Albumin: 3.9 g/dL (ref 3.5–5.0)
Alkaline Phosphatase: 61 U/L (ref 38–126)
Anion gap: 6 (ref 5–15)
BUN: 22 mg/dL — ABNORMAL HIGH (ref 6–20)
CO2: 25 mmol/L (ref 22–32)
Calcium: 9.3 mg/dL (ref 8.9–10.3)
Chloride: 109 mmol/L (ref 98–111)
Creatinine, Ser: 1.08 mg/dL — ABNORMAL HIGH (ref 0.44–1.00)
GFR, Estimated: >60 mL/min (ref >60)
Glucose, Bld: 92 mg/dL (ref 70–99)
Potassium: 3.8 mmol/L (ref 3.5–5.1)
Sodium: 140 mmol/L (ref 135–145)
Total Bilirubin: 0.4 mg/dL (ref 0.3–1.2)
Total Protein: 7.6 g/dL (ref 6.5–8.1)

## 2022-05-28 LAB — CBC
HCT: 41.7 % (ref 36.0–46.0)
Hemoglobin: 14.2 g/dL (ref 12.0–15.0)
MCH: 30.7 pg (ref 26.0–34.0)
MCHC: 34.1 g/dL (ref 30.0–36.0)
MCV: 90.1 fL (ref 80.0–100.0)
Platelets: 292 10*3/uL (ref 150–400)
RBC: 4.63 MIL/uL (ref 3.87–5.11)
RDW: 12.7 % (ref 11.5–15.5)
WBC: 8.6 10*3/uL (ref 4.0–10.5)
nRBC: 0 % (ref 0.0–0.2)

## 2022-05-28 LAB — LIPASE, BLOOD: Lipase: 34 U/L (ref 11–51)

## 2022-05-28 MED ORDER — IOHEXOL 300 MG/ML  SOLN
100.0000 mL | Freq: Once | INTRAMUSCULAR | Status: AC | PRN
  Administered 2022-05-28: 100 mL via INTRAVENOUS

## 2022-05-28 NOTE — ED Triage Notes (Signed)
On Monday had Appendectomy and hernia repair. Was vacuuming today and felt a pull in abdomen now had fullness and pain. Called nurse at [facility] and was told to be seen.

## 2022-05-29 ENCOUNTER — Telehealth: Payer: Self-pay

## 2022-05-29 NOTE — Telephone Encounter (Signed)
Called pt and was advised will need to call office who did  surgery to let them know what's going on. Pt stated she went to ER to be seen. She have a follow  up with they office.

## 2022-05-29 NOTE — ED Provider Notes (Signed)
[facility]  Provider Note  CSN: [id number] Arrival date & time: 05/28/22 2045  History Chief Complaint  Patient presents with   Post-op Problem    Beverly Erickson is a 51 y.o. female who is about a week s/p laparoscopic appendectomy and hernia repair reports she was sleepy when surgeon went over her discharge instructions and she didn't know she was still supposed to be resting so she was doing some vacuuming at home today when she felt a pulling pain in her epigastric area. A telephone triage nurse advised her to come to the ED. No fever or vomiting, bowels are moving well while taking opiate pain meds.    Home Medications Prior to Admission medications   Medication Sig Start Date End Date Taking? Authorizing Provider  albuterol (PROVENTIL HFA;VENTOLIN HFA) 108 (90 Base) MCG/ACT inhaler Inhale 2 puffs into the lungs every 6 (six) hours as needed for wheezing or shortness of breath. Patient not taking: Reported on 05/22/2022 07/22/18   Mosie Lukes, MD  albuterol (VENTOLIN HFA) 108 (90 Base) MCG/ACT inhaler Inhale 2 puffs into the lungs every 6 (six) hours as needed for wheezing or shortness of breath. 07/01/20   Copland, Gay Filler, MD  cetirizine (ZYRTEC) 10 MG tablet Take 1 tablet (10 mg total) by mouth daily. Patient not taking: Reported on 05/22/2022 09/06/18   Mosie Lukes, MD  citalopram (CELEXA) 20 MG tablet Take 1/2 tablet by mouth once daily for 1 week, then increase to a full tab once daily at bedtime Patient not taking: Reported on 05/22/2022 12/19/21   [name], NP  fluticasone (FLONASE) 50 MCG/ACT nasal spray Place 2 sprays into both nostrils daily. Patient not taking: Reported on 05/22/2022 09/06/18   Mosie Lukes, MD  HYDROcodone-acetaminophen [facility]) 7.5-325 MG tablet Take 1 tablet by mouth 3 (three) times daily as needed for moderate pain. 05/10/22 11/06/22  Mosie Lukes, MD  ibuprofen (ADVIL) 200 MG tablet Take 200-400 mg by mouth  every 6 (six) hours as needed for mild pain or headache.    [provider]  nitroGLYCERIN (NITROSTAT) 0.4 MG SL tablet Place 1 tablet (0.4 mg total) under the tongue every 5 (five) minutes as needed for chest pain. Patient not taking: Reported on 05/22/2022 06/07/17   Mosie Lukes, MD  omeprazole (PRILOSEC) 40 MG capsule Take 1 capsule (40 mg total) by mouth 2 (two) times daily. Patient taking differently: Take 40 mg by mouth daily before breakfast. 04/10/22   Mosie Lukes, MD  oxyCODONE (OXY IR/ROXICODONE) 5 MG immediate release tablet Take 1 tablet (5 mg total) by mouth 3 (three) times daily as needed. 05/24/22   Colon Branch, MD  pramipexole (MIRAPEX) 1 MG tablet Take 1 tablet (1 mg total) by mouth at bedtime. Patient not taking: Reported on 05/22/2022 08/11/16   Brunetta Jeans, PA-C     Allergies    Codeine and Mucinex [guaifenesin er]   Review of Systems   Review of Systems Please see HPI for pertinent positives and negatives  Physical Exam BP 136/80 (BP Location: Right Arm)   Pulse 74   Temp 98.2 F (36.8 C) (Oral)   Resp 20   Ht '5\' 8"'$  (1.727 m)   Wt 104 kg   SpO2 97%   BMI 34.86 kg/m   Physical Exam Vitals and nursing note reviewed.  Constitutional:      Appearance: Normal appearance.  HENT:     Head: Normocephalic and atraumatic.  Nose: Nose normal.     Mouth/Throat:     Mouth: Mucous membranes are moist.  Eyes:     Extraocular Movements: Extraocular movements intact.     Conjunctiva/sclera: Conjunctivae normal.  Cardiovascular:     Rate and Rhythm: Normal rate.  Pulmonary:     Effort: Pulmonary effort is normal.     Breath sounds: Normal breath sounds.  Abdominal:     General: Abdomen is flat.     Palpations: Abdomen is soft.     Tenderness: There is no abdominal tenderness (mild diffuse). There is no guarding.     Comments: Healing laparoscopy incisions without signs of infection  Musculoskeletal:        General: No swelling. Normal range  of motion.     Cervical back: Neck supple.  Skin:    General: Skin is warm and dry.  Neurological:     General: No focal deficit present.     Mental Status: She is alert.  Psychiatric:        Mood and Affect: Mood normal.     ED Results / Procedures / Treatments   EKG None Procedures Procedures  Medications Ordered in the ED Medications  iohexol (OMNIPAQUE) 300 MG/ML solution 100 mL (100 mLs Intravenous Contrast Given 05/28/22 2227)    Initial Impression and Plan  Patient here with pain post-op after doing vacuuming at home. Labs done in triage showed normal CBC, BMP and lipase. I personally viewed the images from radiology studies and agree with radiologist interpretation: CT is negative for any signs of post-op complications. Plan discharge home with Gen Surg follow up. Continue pain meds at home as needed. RTED for any other concerns.   ED Course       MDM Rules/Calculators/A&P Medical Decision Making Given presenting complaint, I considered that admission might be necessary. After review of results from ED lab and/or imaging studies, admission to the hospital is not indicated at this time.    Problems Addressed: Post-operative pain: acute illness or injury  Amount and/or Complexity of Data Reviewed Labs: ordered. Decision-making details documented in ED Course. Radiology: ordered and independent interpretation performed. Decision-making details documented in ED Course.  Risk Decision regarding hospitalization.    Final Clinical Impression(s) / ED Diagnoses Final diagnoses:  Post-operative pain    Rx / DC Orders ED Discharge Orders     None        Truddie Hidden, MD 05/29/22 [phone]

## 2022-05-29 NOTE — Telephone Encounter (Signed)
Done

## 2022-06-08 ENCOUNTER — Other Ambulatory Visit: Payer: Self-pay | Admitting: Family Medicine

## 2022-06-08 DIAGNOSIS — M549 Dorsalgia, unspecified: Secondary | ICD-10-CM

## 2022-06-08 DIAGNOSIS — G9050 Complex regional pain syndrome I, unspecified: Secondary | ICD-10-CM

## 2022-06-08 MED ORDER — HYDROCODONE-ACETAMINOPHEN 7.5-325 MG PO TABS
1.0000 | ORAL_TABLET | Freq: Three times a day (TID) | ORAL | 0 refills | Status: DC | PRN
  Filled 2022-06-08: qty 90, 30d supply, fill #0

## 2022-06-08 NOTE — Telephone Encounter (Signed)
Requesting: hydrocodone 7.5-'325mg'$   Contract: 12/19/21 UDS: 12/19/21 Last Visit: 05/17/22 w/ Lenna Sciara Next Visit: 07/10/22 Last Refill: 05/10/22 #90 and 0RF  Please Advise

## 2022-06-09 ENCOUNTER — Other Ambulatory Visit (HOSPITAL_BASED_OUTPATIENT_CLINIC_OR_DEPARTMENT_OTHER): Payer: Self-pay

## 2022-07-09 ENCOUNTER — Other Ambulatory Visit: Payer: Self-pay | Admitting: Family Medicine

## 2022-07-09 DIAGNOSIS — M549 Dorsalgia, unspecified: Secondary | ICD-10-CM

## 2022-07-09 DIAGNOSIS — G9050 Complex regional pain syndrome I, unspecified: Secondary | ICD-10-CM

## 2022-07-10 ENCOUNTER — Other Ambulatory Visit (HOSPITAL_BASED_OUTPATIENT_CLINIC_OR_DEPARTMENT_OTHER): Payer: Self-pay

## 2022-07-10 ENCOUNTER — Ambulatory Visit: Payer: Medicare (Managed Care)

## 2022-07-10 MED ORDER — HYDROCODONE-ACETAMINOPHEN 7.5-325 MG PO TABS
1.0000 | ORAL_TABLET | Freq: Three times a day (TID) | ORAL | 0 refills | Status: DC | PRN
  Filled 2022-07-10: qty 90, 30d supply, fill #0

## 2022-07-10 NOTE — Telephone Encounter (Signed)
Requesting: NORCO Contract:07/18/2021 UDS:12/19/21 Last Visit:05/17/22 Next Visit:n/a Last Refill:06/08/22  Please Advise

## 2022-08-09 ENCOUNTER — Other Ambulatory Visit: Payer: Self-pay | Admitting: Family Medicine

## 2022-08-09 DIAGNOSIS — M549 Dorsalgia, unspecified: Secondary | ICD-10-CM

## 2022-08-09 DIAGNOSIS — G9050 Complex regional pain syndrome I, unspecified: Secondary | ICD-10-CM

## 2022-08-09 MED ORDER — HYDROCODONE-ACETAMINOPHEN 7.5-325 MG PO TABS
1.0000 | ORAL_TABLET | Freq: Three times a day (TID) | ORAL | 0 refills | Status: DC | PRN
  Filled 2022-08-09: qty 90, 30d supply, fill #0

## 2022-08-09 NOTE — Telephone Encounter (Signed)
Requesting: hydrocodone 7.5-'325mg'$   Contract: 12/19/21 UDS: 12/19/21 Last Visit: 05/17/22 Next Visit: None Last Refill:07/10/22 #90 and 0RF   Please Advise

## 2022-08-10 ENCOUNTER — Other Ambulatory Visit (HOSPITAL_BASED_OUTPATIENT_CLINIC_OR_DEPARTMENT_OTHER): Payer: Self-pay

## 2022-08-31 ENCOUNTER — Telehealth: Payer: Self-pay | Admitting: Family Medicine

## 2022-08-31 NOTE — Telephone Encounter (Signed)
Pt called to ask if she could request and early fill, as she will be due for rx over the holiday weekend . Please advise.   Medication:  HYDROcodone-acetaminophen (NORCO) 7.5-325 MG tablet   Preferred Pharmacy:  [facility] [address], [place], [place] [address] Phone: [phone]  Fax: [phone]

## 2022-09-01 ENCOUNTER — Other Ambulatory Visit (HOSPITAL_BASED_OUTPATIENT_CLINIC_OR_DEPARTMENT_OTHER): Payer: Self-pay

## 2022-09-01 ENCOUNTER — Other Ambulatory Visit: Payer: Self-pay | Admitting: Family

## 2022-09-01 DIAGNOSIS — G9050 Complex regional pain syndrome I, unspecified: Secondary | ICD-10-CM

## 2022-09-01 DIAGNOSIS — M549 Dorsalgia, unspecified: Secondary | ICD-10-CM

## 2022-09-01 MED ORDER — HYDROCODONE-ACETAMINOPHEN 7.5-325 MG PO TABS
1.0000 | ORAL_TABLET | Freq: Three times a day (TID) | ORAL | 0 refills | Status: DC | PRN
  Filled 2022-09-01 – 2022-09-08 (×2): qty 90, 30d supply, fill #0

## 2022-09-08 ENCOUNTER — Other Ambulatory Visit (HOSPITAL_BASED_OUTPATIENT_CLINIC_OR_DEPARTMENT_OTHER): Payer: Self-pay

## 2022-09-22 ENCOUNTER — Telehealth: Payer: Self-pay | Admitting: *Deleted

## 2022-09-22 NOTE — Telephone Encounter (Signed)
LMOM for pt to schedule AWV. 

## 2022-10-10 ENCOUNTER — Other Ambulatory Visit: Payer: Self-pay | Admitting: Family

## 2022-10-10 DIAGNOSIS — M549 Dorsalgia, unspecified: Secondary | ICD-10-CM

## 2022-10-10 DIAGNOSIS — G9050 Complex regional pain syndrome I, unspecified: Secondary | ICD-10-CM

## 2022-10-11 ENCOUNTER — Other Ambulatory Visit (HOSPITAL_BASED_OUTPATIENT_CLINIC_OR_DEPARTMENT_OTHER): Payer: Self-pay

## 2022-10-12 ENCOUNTER — Other Ambulatory Visit (HOSPITAL_BASED_OUTPATIENT_CLINIC_OR_DEPARTMENT_OTHER): Payer: Self-pay

## 2022-10-12 ENCOUNTER — Other Ambulatory Visit: Payer: Self-pay | Admitting: Family Medicine

## 2022-10-12 DIAGNOSIS — M549 Dorsalgia, unspecified: Secondary | ICD-10-CM

## 2022-10-12 DIAGNOSIS — G9050 Complex regional pain syndrome I, unspecified: Secondary | ICD-10-CM

## 2022-10-12 MED ORDER — HYDROCODONE-ACETAMINOPHEN 7.5-325 MG PO TABS
1.0000 | ORAL_TABLET | Freq: Three times a day (TID) | ORAL | 0 refills | Status: DC | PRN
  Filled 2022-10-12: qty 90, 30d supply, fill #0

## 2022-10-12 NOTE — Telephone Encounter (Signed)
Looks like rx had gotten routed to Dr.Webb in error.   HYDROcodone-acetaminophen (NORCO) 7.5-325 MG tablet  [address] [place], [place] [address] Phone: [phone]  Fax: [phone]

## 2022-10-12 NOTE — Telephone Encounter (Signed)
Requesting: hydrocodone 7.5-'325mg'$   Contract: 12/19/21 UDS:12/19/21 Last Visit:  05/17/22 Next Visit: 01/15/23 Last Refill: 09/01/22 #90 and 0RF   Please Advise

## 2022-11-07 DIAGNOSIS — Z03818 Encounter for observation for suspected exposure to other biological agents ruled out: Secondary | ICD-10-CM | POA: Diagnosis not present

## 2022-11-07 DIAGNOSIS — J029 Acute pharyngitis, unspecified: Secondary | ICD-10-CM | POA: Diagnosis not present

## 2022-11-07 DIAGNOSIS — H66003 Acute suppurative otitis media without spontaneous rupture of ear drum, bilateral: Secondary | ICD-10-CM | POA: Diagnosis not present

## 2022-11-07 DIAGNOSIS — R509 Fever, unspecified: Secondary | ICD-10-CM | POA: Diagnosis not present

## 2022-11-07 DIAGNOSIS — R030 Elevated blood-pressure reading, without diagnosis of hypertension: Secondary | ICD-10-CM | POA: Diagnosis not present

## 2022-11-07 DIAGNOSIS — R0602 Shortness of breath: Secondary | ICD-10-CM | POA: Diagnosis not present

## 2022-11-07 DIAGNOSIS — R051 Acute cough: Secondary | ICD-10-CM | POA: Diagnosis not present

## 2022-11-09 ENCOUNTER — Other Ambulatory Visit (HOSPITAL_BASED_OUTPATIENT_CLINIC_OR_DEPARTMENT_OTHER): Payer: Self-pay

## 2022-11-09 ENCOUNTER — Ambulatory Visit (INDEPENDENT_AMBULATORY_CARE_PROVIDER_SITE_OTHER): Payer: Medicare (Managed Care) | Admitting: Family Medicine

## 2022-11-09 ENCOUNTER — Encounter: Payer: Self-pay | Admitting: Family Medicine

## 2022-11-09 ENCOUNTER — Ambulatory Visit: Payer: Medicare (Managed Care) | Admitting: Family Medicine

## 2022-11-09 VITALS — BP 119/73 | HR 78 | Temp 98.2°F | Resp 16 | Ht 67.5 in | Wt 244.4 lb

## 2022-11-09 DIAGNOSIS — H6690 Otitis media, unspecified, unspecified ear: Secondary | ICD-10-CM | POA: Diagnosis not present

## 2022-11-09 DIAGNOSIS — Z79899 Other long term (current) drug therapy: Secondary | ICD-10-CM

## 2022-11-09 DIAGNOSIS — M549 Dorsalgia, unspecified: Secondary | ICD-10-CM | POA: Diagnosis not present

## 2022-11-09 DIAGNOSIS — E669 Obesity, unspecified: Secondary | ICD-10-CM | POA: Diagnosis not present

## 2022-11-09 DIAGNOSIS — Z6837 Body mass index (BMI) 37.0-37.9, adult: Secondary | ICD-10-CM

## 2022-11-09 DIAGNOSIS — G9050 Complex regional pain syndrome I, unspecified: Secondary | ICD-10-CM

## 2022-11-09 DIAGNOSIS — F418 Other specified anxiety disorders: Secondary | ICD-10-CM

## 2022-11-09 MED ORDER — HYDROCODONE-ACETAMINOPHEN 7.5-325 MG PO TABS
1.0000 | ORAL_TABLET | Freq: Three times a day (TID) | ORAL | 0 refills | Status: DC | PRN
  Filled 2022-11-13: qty 90, 30d supply, fill #0

## 2022-11-09 MED ORDER — PREDNISONE 20 MG PO TABS
40.0000 mg | ORAL_TABLET | Freq: Every day | ORAL | 0 refills | Status: AC
  Filled 2022-11-09: qty 10, 5d supply, fill #0

## 2022-11-09 MED ORDER — CITALOPRAM HYDROBROMIDE 20 MG PO TABS
ORAL_TABLET | ORAL | 0 refills | Status: DC
  Filled 2022-11-09: qty 30, 30d supply, fill #0

## 2022-11-09 NOTE — Progress Notes (Signed)
Acute Office Visit  Subjective:     Patient ID: Beverly Erickson, female    DOB: [birth year], 52 y.o.   MRN: [id number]  Chief Complaint  Patient presents with   Follow-up   Fatigue    HPI Patient is in today for follow-up.  Patient is due for UDS/Contract update. She has been getting Norco for chronic pain. Last UDS was negative 12/19/21 and contract updated at that time. Reports current Rx is working well without any side effects. She is having to use it three times daily. Last fill was 10/12/22.  Patient states she was diagnosed with bilateral ear infections and tonsillitis 2 days ago and has started Augmentin, tessalon pearls, and 3 days of prednisone taper (30-20-10 mg taper). She continues having a lot of pressure/popping in her ears, but throat is starting to feel a little bit better.   Additionally, she is asking for her A1c and TSH to be checked today due to some fatigue, brain fog, sweet taste in her mouth, and feeling a little strange after meals. Patient denies any chest pain, palpitations, dyspnea, wheezing, edema, recurrent headaches, vision changes.    ROS All review of systems negative except what is listed in the HPI      Objective:    BP 119/73   Pulse 78   Temp 98.2 F (36.8 C)   Resp 16   Ht 5' 7.5" (1.715 m)   Wt 244 lb 6.4 oz (110.9 kg)   SpO2 99%   BMI 37.71 kg/m    Physical Exam Vitals reviewed.  Constitutional:      Appearance: Normal appearance.  Cardiovascular:     Rate and Rhythm: Normal rate and regular rhythm.     Pulses: Normal pulses.     Heart sounds: Normal heart sounds.  Pulmonary:     Effort: Pulmonary effort is normal.     Breath sounds: Normal breath sounds.  Skin:    General: Skin is warm and dry.  Neurological:     Mental Status: She is alert and oriented to person, place, and time.  Psychiatric:        Mood and Affect: Mood normal.        Behavior: Behavior normal.        Thought Content: Thought content normal.         Judgment: Judgment normal.     No results found for any visits on 11/09/22.      Assessment & Plan:    1. High risk medication use - Urine drugs of abuse scrn w alc, routine (Ref Lab)  2. RSD (reflex sympathetic dystrophy) 3. Mid back pain on right side PDMP reviewed. Contract and UDS updated today. Refill for after 11/12/22 sent in.  - HYDROcodone-acetaminophen ([place]) 7.5-325 MG tablet; Take 1 tablet by mouth 3 (three) times daily as needed for moderate pain.  Dispense: 90 tablet; Refill: 0  4. Acute otitis media, unspecified otitis media type Continue Augmentin. Adding some extra prednisone given the significant inflammation and discomfort. Continue supportive measures including rest, hydration, humidifier use, steam showers, warm compresses to sinuses, warm liquids with lemon and honey, and over-the-counter cough, cold, and analgesics as needed.   - predniSONE (DELTASONE) 20 MG tablet; Take 2 tablets (40 mg total) by mouth daily with breakfast for 5 days.  Dispense: 10 tablet; Refill: 0  5. Class 2 obesity with body mass index (BMI) of 37.0 to 37.9 in adult, unspecified obesity type, unspecified whether serious comorbidity present Patient has had  some fatigue and feeling "off" after meals. Will get some labs today. She is worried about diabetes.   - HgB A1c - TSH - CBC - Comp Met (CMET)  6. Depression with anxiety Patient would like to try the Celexa previously prescribed. Discussed medication and safety. No SI/HI. Recommend f/u in 4-6 weeks.  - citalopram (CELEXA) 20 MG tablet; Take 1/2 tablet by mouth once daily for 1 week, then increase to a full tab once daily at bedtime  Dispense: 30 tablet; Refill: 0    Return in about 6 weeks (around 12/21/2022) for mood f/u w PCP.  Terrilyn Saver, NP

## 2022-11-10 ENCOUNTER — Other Ambulatory Visit (HOSPITAL_BASED_OUTPATIENT_CLINIC_OR_DEPARTMENT_OTHER): Payer: Self-pay

## 2022-11-13 ENCOUNTER — Other Ambulatory Visit (HOSPITAL_BASED_OUTPATIENT_CLINIC_OR_DEPARTMENT_OTHER): Payer: Self-pay

## 2022-12-13 ENCOUNTER — Other Ambulatory Visit: Payer: Self-pay | Admitting: Family Medicine

## 2022-12-13 DIAGNOSIS — M549 Dorsalgia, unspecified: Secondary | ICD-10-CM

## 2022-12-13 DIAGNOSIS — G9050 Complex regional pain syndrome I, unspecified: Secondary | ICD-10-CM

## 2022-12-14 ENCOUNTER — Other Ambulatory Visit (HOSPITAL_COMMUNITY): Payer: Self-pay

## 2022-12-14 ENCOUNTER — Other Ambulatory Visit (HOSPITAL_BASED_OUTPATIENT_CLINIC_OR_DEPARTMENT_OTHER): Payer: Self-pay

## 2022-12-14 MED ORDER — HYDROCODONE-ACETAMINOPHEN 7.5-325 MG PO TABS
1.0000 | ORAL_TABLET | Freq: Three times a day (TID) | ORAL | 0 refills | Status: DC | PRN
  Filled 2022-12-14 – 2022-12-29 (×2): qty 90, 30d supply, fill #0

## 2022-12-22 ENCOUNTER — Other Ambulatory Visit (HOSPITAL_BASED_OUTPATIENT_CLINIC_OR_DEPARTMENT_OTHER): Payer: Self-pay

## 2022-12-25 NOTE — Assessment & Plan Note (Signed)
Managing on current pain meds. Encouraged to stay as active as able

## 2022-12-25 NOTE — Assessment & Plan Note (Signed)
Encouraged DASH or MIND diet, decrease po intake and increase exercise as tolerated. Needs 7-8 hours of sleep nightly. Avoid trans fats, eat small, frequent meals every 4-5 hours with lean proteins, complex carbs and healthy fats. Minimize simple carbs, high fat foods and processed foods 

## 2022-12-25 NOTE — Assessment & Plan Note (Signed)
Encourage heart healthy diet such as MIND or DASH diet, increase exercise, avoid trans fats, simple carbohydrates and processed foods, consider a krill or fish or flaxseed oil cap daily.  °

## 2022-12-25 NOTE — Assessment & Plan Note (Signed)
Hydrate and monitor 

## 2022-12-25 NOTE — Assessment & Plan Note (Signed)
hgba1c acceptable, minimize simple carbs. Increase exercise as tolerated.  

## 2022-12-26 ENCOUNTER — Other Ambulatory Visit (HOSPITAL_BASED_OUTPATIENT_CLINIC_OR_DEPARTMENT_OTHER): Payer: Self-pay

## 2022-12-26 ENCOUNTER — Ambulatory Visit (INDEPENDENT_AMBULATORY_CARE_PROVIDER_SITE_OTHER): Payer: Medicare (Managed Care) | Admitting: Family Medicine

## 2022-12-26 ENCOUNTER — Other Ambulatory Visit: Payer: Self-pay

## 2022-12-26 VITALS — BP 124/70 | HR 70 | Temp 97.5°F | Ht 67.0 in | Wt 241.4 lb

## 2022-12-26 DIAGNOSIS — M549 Dorsalgia, unspecified: Secondary | ICD-10-CM

## 2022-12-26 DIAGNOSIS — E782 Mixed hyperlipidemia: Secondary | ICD-10-CM | POA: Diagnosis not present

## 2022-12-26 DIAGNOSIS — E6609 Other obesity due to excess calories: Secondary | ICD-10-CM

## 2022-12-26 DIAGNOSIS — F418 Other specified anxiety disorders: Secondary | ICD-10-CM

## 2022-12-26 DIAGNOSIS — R42 Dizziness and giddiness: Secondary | ICD-10-CM

## 2022-12-26 DIAGNOSIS — R252 Cramp and spasm: Secondary | ICD-10-CM

## 2022-12-26 DIAGNOSIS — R739 Hyperglycemia, unspecified: Secondary | ICD-10-CM

## 2022-12-26 DIAGNOSIS — G90522 Complex regional pain syndrome I of left lower limb: Secondary | ICD-10-CM

## 2022-12-26 LAB — CBC WITH DIFFERENTIAL/PLATELET
Basophils Absolute: 0.1 10*3/uL (ref 0.0–0.1)
Basophils Relative: 0.7 % (ref 0.0–3.0)
Eosinophils Absolute: 0.1 10*3/uL (ref 0.0–0.7)
Eosinophils Relative: 1.3 % (ref 0.0–5.0)
HCT: 41.9 % (ref 36.0–46.0)
Hemoglobin: 14 g/dL (ref 12.0–15.0)
Lymphocytes Relative: 34.4 % (ref 12.0–46.0)
Lymphs Abs: 2.4 10*3/uL (ref 0.7–4.0)
MCHC: 33.4 g/dL (ref 30.0–36.0)
MCV: 92.4 fl (ref 78.0–100.0)
Monocytes Absolute: 0.4 10*3/uL (ref 0.1–1.0)
Monocytes Relative: 5.4 % (ref 3.0–12.0)
Neutro Abs: 4.1 10*3/uL (ref 1.4–7.7)
Neutrophils Relative %: 58.2 % (ref 43.0–77.0)
Platelets: 279 10*3/uL (ref 150.0–400.0)
RBC: 4.53 Mil/uL (ref 3.87–5.11)
RDW: 13.5 % (ref 11.5–15.5)
WBC: 7 10*3/uL (ref 4.0–10.5)

## 2022-12-26 LAB — COMPREHENSIVE METABOLIC PANEL
ALT: 21 U/L (ref 0–35)
AST: 17 U/L (ref 0–37)
Albumin: 3.8 g/dL (ref 3.5–5.2)
Alkaline Phosphatase: 57 U/L (ref 39–117)
BUN: 15 mg/dL (ref 6–23)
CO2: 25 mEq/L (ref 19–32)
Calcium: 9 mg/dL (ref 8.4–10.5)
Chloride: 109 mEq/L (ref 96–112)
Creatinine, Ser: 0.64 mg/dL (ref 0.40–1.20)
GFR: 102.77 mL/min (ref >60.00)
Glucose, Bld: 87 mg/dL (ref 70–99)
Potassium: 4.1 mEq/L (ref 3.5–5.1)
Sodium: 140 mEq/L (ref 135–145)
Total Bilirubin: 0.3 mg/dL (ref 0.2–1.2)
Total Protein: 6.3 g/dL (ref 6.0–8.3)

## 2022-12-26 LAB — LIPID PANEL
Cholesterol: 144 mg/dL (ref 0–200)
HDL: 37.5 mg/dL — ABNORMAL LOW (ref >39.00)
LDL Cholesterol: 93 mg/dL (ref 0–99)
NonHDL: 106.82
Total CHOL/HDL Ratio: 4
Triglycerides: 67 mg/dL (ref 0.0–149.0)
VLDL: 13.4 mg/dL (ref 0.0–40.0)

## 2022-12-26 LAB — HEMOGLOBIN A1C: Hgb A1c MFr Bld: 5.5 % (ref 4.6–6.5)

## 2022-12-26 LAB — TSH: TSH: 1.58 u[IU]/mL (ref 0.35–5.50)

## 2022-12-26 LAB — MAGNESIUM: Magnesium: 1.9 mg/dL (ref 1.5–2.5)

## 2022-12-26 MED ORDER — MECLIZINE HCL 25 MG PO TABS
25.0000 mg | ORAL_TABLET | Freq: Three times a day (TID) | ORAL | 0 refills | Status: AC | PRN
  Filled 2022-12-26: qty 30, 10d supply, fill #0

## 2022-12-26 MED ORDER — ALPRAZOLAM 0.25 MG PO TABS
0.2500 mg | ORAL_TABLET | Freq: Two times a day (BID) | ORAL | 3 refills | Status: AC | PRN
  Filled 2022-12-26: qty 30, 15d supply, fill #0
  Filled 2023-02-28: qty 30, 15d supply, fill #1

## 2022-12-26 NOTE — Assessment & Plan Note (Signed)
Has had trouble off and on for several months. Started with she was exercising and losing weight, she got dehydrated and it has never fully resolved since then. Encouraged to give up diet drinks, increase water and decrease caffeine. Eat protein every 4-5 hours, move slowly and consider PT if worsens. Given Meclizine to use prn

## 2022-12-26 NOTE — Assessment & Plan Note (Signed)
On Citalporam and had been managing well but now her twin grandsons aged 51 and an 68 year old grandchild are living with them and she is managing more stress. Is allowed a refill on Alprazolam to use infrequently but if she needs it frquently will need to adjust other meds

## 2022-12-26 NOTE — Progress Notes (Signed)
Subjective:   By signing my name below, I, Kellie Simmering, attest that this documentation has been prepared under the direction and in the presence of Mosie Lukes, MD., 12/26/2022.   Patient ID: Beverly Erickson, female    DOB: August 21, 1971, 52 y.o.   MRN: [id number]  Chief Complaint  Patient presents with   Follow-up    Follow up   HPI Patient is in today for an office visit. She denies CP/palpitations/SOB/HA/congestion/ fever/chills/GI or GU symptoms.  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 She continues taking Citalopram 20 mg and is requesting a refill on Alprazolam 0.25 mg. Additionally, she is smoking 1 packet of cigarettes daily.  Vertigo Patient reports that recently she started walking to lose weight, but this was causing dizziness. She has stopped walking, but continues feeling dizzy. She states that when laying in bed at night, she feels like everything is spinning. She is requesting for her thyroid markers to be checked today as well as her ears during the physical exam.   Left Leg Pain Patient continues taking Hydrocodone-Acetaminophen 7.5-325 mg to manage lower back/left leg pain.   Past Medical History:  Diagnosis Date   Anemia    Chicken pox    Depression    Depression with anxiety 08/30/2015   GERD (gastroesophageal reflux disease)    Kidney stone 02/13/2014   Right, small   Neck pain, musculoskeletal 11/17/2016   Obesity    Other and unspecified hyperlipidemia 01/14/2014   Otitis media 11/17/2016   Preventative health care 07/05/2014   RSD (reflex sympathetic dystrophy)    Tinea corporis 06/10/2017   Tobacco use disorder 01/14/2014   1ppd   Unspecified constipation 05/25/2014   Urinary frequency 03/06/2017    Past Surgical History:  Procedure Laterality Date   CESAREAN SECTION  2000   LAPAROSCOPIC APPENDECTOMY N/A 05/22/2022   Procedure: APPENDECTOMY LAPAROSCOPIC;  Surgeon: Coralie Keens, MD;   Location: [facility];  Service: General;  Laterality: N/A;   plate and screws in left arm  2005   humerus   WISDOM TOOTH EXTRACTION  52 yrs old    Family History  Problem Relation Age of Onset   Arthritis Mother        Living   Hypertension Mother    Thrombocytopenia Mother    Hyperlipidemia Father    Hypertension Father    Thrombocytopenia Maternal Grandmother    Stroke Maternal Grandmother    Hypertension Maternal Grandmother    Alcohol abuse Maternal Grandfather    Heart disease Maternal Grandfather    Parkinson's disease Maternal Grandfather    Alcohol abuse Paternal Grandfather    Cancer Paternal Grandfather    Hypothyroidism Daughter    Diabetes Daughter        type 1   Dementia Paternal Grandmother     Social History   Socioeconomic History   Marital status: Married    Spouse name: Not on file   Number of children: 3   Years of education: Not on file   Highest education level: Not on file  Occupational History   Occupation: Scientist, water quality    Comment: Disability, but works at [employer] part time  Tobacco Use   Smoking status: Every Day    Packs/day: 1.00    Years: 20.00    Total pack years: 20.00    Types: Cigarettes   Smokeless tobacco: Never  Substance and Sexual Activity   Alcohol use: Not Currently  Comment: once a year.   Drug use: No   Sexual activity: Not on file    Comment: lives with son, 48 yo daughter, boyfriend, no dietary  Other Topics Concern   Not on file  Social History Narrative   Children live nearby   Social Determinants of Health   Financial Resource Strain: Medium Risk (07/09/2021)   Overall Financial Resource Strain (CARDIA)    Difficulty of Paying Living Expenses: Somewhat hard  Food Insecurity: Food Insecurity Present (07/09/2021)   Hunger Vital Sign    Worried About Running Out of Food in the Last Year: Sometimes true    Ran Out of Food in the Last Year: Never true  Transportation Needs: No Transportation Needs (07/09/2021)    PRAPARE - Hydrologist (Medical): No    Lack of Transportation (Non-Medical): No  Physical Activity: Inactive (07/09/2021)   Exercise Vital Sign    Days of Exercise per Week: 0 days    Minutes of Exercise per Session: 0 min  Stress: Stress Concern Present (07/09/2021)   [place]    Feeling of Stress : To some extent  Social Connections: Moderately Isolated (07/09/2021)   Social Connection and Isolation Panel [NHANES]    Frequency of Communication with Friends and Family: More than three times a week    Frequency of Social Gatherings with Friends and Family: More than three times a week    Attends Religious Services: Never    Marine scientist or Organizations: No    Attends Archivist Meetings: Never    Marital Status: Married  Human resources officer Violence: Not At Risk (07/09/2021)   Humiliation, Afraid, Rape, and Kick questionnaire    Fear of Current or Ex-Partner: No    Emotionally Abused: No    Physically Abused: No    Sexually Abused: No    Outpatient Medications Prior to Visit  Medication Sig Dispense Refill   albuterol (PROVENTIL HFA;VENTOLIN HFA) 108 (90 Base) MCG/ACT inhaler Inhale 2 puffs into the lungs every 6 (six) hours as needed for wheezing or shortness of breath. 2 Inhaler 2   albuterol (VENTOLIN HFA) 108 (90 Base) MCG/ACT inhaler Inhale 2 puffs into the lungs every 6 (six) hours as needed for wheezing or shortness of breath. 18 g 2   cetirizine (ZYRTEC) 10 MG tablet Take 1 tablet (10 mg total) by mouth daily. 30 tablet 11   citalopram (CELEXA) 20 MG tablet Take 1/2 tablet by mouth once daily for 1 week, then increase to a full tab once daily at bedtime 30 tablet 0   fluticasone (FLONASE) 50 MCG/ACT nasal spray Place 2 sprays into both nostrils daily. 16 g 6   HYDROcodone-acetaminophen (NORCO) 7.5-325 MG tablet Take 1 tablet by mouth 3 (three) times daily as needed  for moderate pain. 90 tablet 0   ibuprofen (ADVIL) 200 MG tablet Take 200-400 mg by mouth every 6 (six) hours as needed for mild pain or headache.     nitroGLYCERIN (NITROSTAT) 0.4 MG SL tablet Place 1 tablet (0.4 mg total) under the tongue every 5 (five) minutes as needed for chest pain. 25 tablet 1   omeprazole (PRILOSEC) 40 MG capsule Take 1 capsule (40 mg total) by mouth 2 (two) times daily. (Patient taking differently: Take 40 mg by mouth daily before breakfast.) 180 capsule 1   pramipexole (MIRAPEX) 1 MG tablet Take 1 tablet (1 mg total) by mouth at bedtime. 30 tablet  2   No facility-administered medications prior to visit.    Allergies  Allergen Reactions   Codeine Nausea Only   Mucinex [Guaifenesin Er] Other (See Comments)    Nightmares    Review of Systems  Constitutional:  Negative for chills and fever.  HENT:  Negative for congestion.   Respiratory:  Negative for shortness of breath.   Cardiovascular:  Negative for chest pain and palpitations.  Gastrointestinal:  Negative for abdominal pain, blood in stool, constipation, diarrhea, nausea and vomiting.  Genitourinary:  Negative for dysuria, frequency, hematuria and urgency.  Musculoskeletal:  Positive for back pain.       (+) left leg pain  Skin:           Neurological:  Positive for dizziness. Negative for headaches.       Objective:    Physical Exam Constitutional:      General: She is not in acute distress.    Appearance: Normal appearance. She is normal weight. She is not ill-appearing.  HENT:     Head: Normocephalic and atraumatic.     Right Ear: Tympanic membrane, ear canal and external ear normal.     Left Ear: Tympanic membrane, ear canal and external ear normal.     Ears:     Comments: Ear canals are slightly erythematic.    Nose: Nose normal.     Mouth/Throat:     Mouth: Mucous membranes are moist.     Pharynx: Oropharynx is clear.  Eyes:     General:        Right eye: No discharge.        Left  eye: No discharge.     Extraocular Movements: Extraocular movements intact.     Right eye: Nystagmus present.     Left eye: Nystagmus present.     Conjunctiva/sclera: Conjunctivae normal.     Pupils: Pupils are equal, round, and reactive to light.  Cardiovascular:     Rate and Rhythm: Normal rate and regular rhythm.     Pulses: Normal pulses.     Heart sounds: Normal heart sounds. No murmur heard.    No gallop.  Pulmonary:     Effort: Pulmonary effort is normal. No respiratory distress.     Breath sounds: Normal breath sounds. No wheezing or rales.  Abdominal:     General: Bowel sounds are normal.     Palpations: Abdomen is soft.     Tenderness: There is no abdominal tenderness. There is no guarding.  Musculoskeletal:        General: Normal range of motion.     Cervical back: Normal range of motion.     Right lower leg: No edema.     Left lower leg: No edema.  Skin:    General: Skin is warm and dry.  Neurological:     Mental Status: She is alert and oriented to person, place, and time.     Comments: Patient reports vertigo when turning her head to the right.  Psychiatric:        Mood and Affect: Mood normal.        Behavior: Behavior normal.        Judgment: Judgment normal.     BP 124/70 (BP Location: Right Arm, Patient Position: Sitting, Cuff Size: Normal)   Pulse 70   Temp (!) 97.5 F (36.4 C) (Oral)   Ht '5\' 7"'$  (1.702 m)   Wt 241 lb 6.4 oz (109.5 kg)   SpO2 96%   BMI 37.81  kg/m  Wt Readings from Last 3 Encounters:  12/26/22 241 lb 6.4 oz (109.5 kg)  11/09/22 244 lb 6.4 oz (110.9 kg)  05/28/22 229 lb 4.5 oz (104 kg)    Diabetic Foot Exam - Simple   No data filed    Lab Results  Component Value Date   WBC 8.6 05/28/2022   HGB 14.2 05/28/2022   HCT 41.7 05/28/2022   PLT 292 05/28/2022   GLUCOSE 92 05/28/2022   CHOL 177 07/18/2021   TRIG 108.0 07/18/2021   HDL 48.80 07/18/2021   LDLCALC 107 (H) 07/18/2021   ALT 21 05/28/2022   AST 19 05/28/2022   NA  140 05/28/2022   K 3.8 05/28/2022   CL 109 05/28/2022   CREATININE 1.08 (H) 05/28/2022   BUN 22 (H) 05/28/2022   CO2 25 05/28/2022   TSH 1.07 07/18/2021   HGBA1C 5.6 05/17/2022    Lab Results  Component Value Date   TSH 1.07 07/18/2021   Lab Results  Component Value Date   WBC 8.6 05/28/2022   HGB 14.2 05/28/2022   HCT 41.7 05/28/2022   MCV 90.1 05/28/2022   PLT 292 05/28/2022   Lab Results  Component Value Date   NA 140 05/28/2022   K 3.8 05/28/2022   CO2 25 05/28/2022   GLUCOSE 92 05/28/2022   BUN 22 (H) 05/28/2022   CREATININE 1.08 (H) 05/28/2022   BILITOT 0.4 05/28/2022   ALKPHOS 61 05/28/2022   AST 19 05/28/2022   ALT 21 05/28/2022   PROT 7.6 05/28/2022   ALBUMIN 3.9 05/28/2022   CALCIUM 9.3 05/28/2022   ANIONGAP 6 05/28/2022   GFR 67.85 07/18/2021   Lab Results  Component Value Date   CHOL 177 07/18/2021   Lab Results  Component Value Date   HDL 48.80 07/18/2021   Lab Results  Component Value Date   LDLCALC 107 (H) 07/18/2021   Lab Results  Component Value Date   TRIG 108.0 07/18/2021   Lab Results  Component Value Date   CHOLHDL 4 07/18/2021   Lab Results  Component Value Date   HGBA1C 5.6 05/17/2022      Assessment & Plan:  Anxiety/Depression: Alprazolam 0.25 mg: Take 1 tablet (0.25 mg total) by mouth 2 (two) times daily as needed for anxiety   Vertigo: Meclizine 25 mg: Take 1 tablet (25 mg total) by mouth 3 (three) times daily as needed for dizziness.   Smoking: Encouraged smoking cessation and offered nicotine patch prescription.   Labs: Routine blood work ordered. Problem List Items Addressed This Visit     Complex regional pain syndrome I of lower limb - Primary    Managing on current pain meds. Encouraged to stay as active as able      Relevant Medications   ALPRAZolam (XANAX) 0.25 MG tablet   Other Relevant Orders   CBC with Differential/Platelet   Depression with anxiety    On Citalporam and had been managing well but  now her twin grandsons aged 42 and an 86 year old grandchild are living with them and she is managing more stress. Is allowed a refill on Alprazolam to use infrequently but if she needs it frquently will need to adjust other meds      Relevant Medications   ALPRAZolam (XANAX) 0.25 MG tablet   Hyperglycemia    hgba1c acceptable, minimize simple carbs. Increase exercise as tolerated.       Relevant Orders   Hemoglobin A1c   Comprehensive metabolic panel  TSH   Hyperlipidemia, mixed    Encourage heart healthy diet such as MIND or DASH diet, increase exercise, avoid trans fats, simple carbohydrates and processed foods, consider a krill or fish or flaxseed oil cap daily.       Relevant Orders   Lipid panel   TSH   Mid back pain on right side   Relevant Medications   ALPRAZolam (XANAX) 0.25 MG tablet   Muscle cramp    Hydrate and monitor       Relevant Orders   TSH   Magnesium   Obesity    Encouraged DASH or MIND diet, decrease po intake and increase exercise as tolerated. Needs 7-8 hours of sleep nightly. Avoid trans fats, eat small, frequent meals every 4-5 hours with lean proteins, complex carbs and healthy fats. Minimize simple carbs, high fat foods and processed foods       Vertigo    Has had trouble off and on for several months. Started with she was exercising and losing weight, she got dehydrated and it has never fully resolved since then. Encouraged to give up diet drinks, increase water and decrease caffeine. Eat protein every 4-5 hours, move slowly and consider PT if worsens. Given Meclizine to use prn      Meds ordered this encounter  Medications   ALPRAZolam (XANAX) 0.25 MG tablet    Sig: Take 1 tablet (0.25 mg total) by mouth 2 (two) times daily as needed for anxiety.    Dispense:  30 tablet    Refill:  3   meclizine (ANTIVERT) 25 MG tablet    Sig: Take 1 tablet (25 mg total) by mouth 3 (three) times daily as needed for dizziness.    Dispense:  30 tablet     Refill:  0   I, Penni Homans, MD, personally preformed the services described in this documentation.  All medical record entries made by the scribe were at my direction and in my presence.  I have reviewed the chart and discharge instructions (if applicable) and agree that the record reflects my personal performance and is accurate and complete. 12/26/2022  I,Mohammed Iqbal,acting as a scribe for Penni Homans, MD.,have documented all relevant documentation on the behalf of Penni Homans, MD,as directed by  Penni Homans, MD while in the presence of Penni Homans, MD.  Penni Homans, MD

## 2022-12-26 NOTE — Patient Instructions (Signed)
Vertigo Vertigo is the feeling that you or your surroundings are moving when they are not. This feeling can come and go at any time. Vertigo often goes away on its own. Vertigo can be dangerous if it occurs while you are doing something that could endanger yourself or others, such as driving or operating machinery. Your health care provider will do tests to try to determine the cause of your vertigo. Tests will also help your health care provider decide how best to treat your condition. Follow these instructions at home: Eating and drinking     Dehydration can make vertigo worse. Drink enough fluid to keep your urine pale yellow. Do not drink alcohol. Activity Return to your normal activities as told by your health care provider. Ask your health care provider what activities are safe for you. In the morning, first sit up on the side of the bed. When you feel okay, stand slowly while you hold onto something until you know that your balance is fine. Move slowly. Avoid sudden body or head movements or certain positions, as told by your health care provider. If you have trouble walking or keeping your balance, try using a cane for stability. If you feel dizzy or unstable, sit down right away. Avoid doing any tasks that would cause danger to you or others if vertigo occurs. Avoid bending down if you feel dizzy. Place items in your home so that they are easy for you to reach without bending or leaning over. Do not drive or use machinery if you feel dizzy. General instructions Take over-the-counter and prescription medicines only as told by your health care provider. Keep all follow-up visits. This is important. Contact a health care provider if: Your medicines do not relieve your vertigo or they make it worse. Your condition gets worse or you develop new symptoms. You have a fever. You develop nausea or vomiting, or if nausea gets worse. Your family or friends notice any behavioral changes. You  have numbness or a prickling and tingling sensation in part of your body. Get help right away if you: Are always dizzy or you faint. Develop severe headaches. Develop a stiff neck. Develop sensitivity to light. Have difficulty moving or speaking. Have weakness in your hands, arms, or legs. Have changes in your hearing or vision. These symptoms may represent a serious problem that is an emergency. Do not wait to see if the symptoms will go away. Get medical help right away. Call your local emergency services (911 in the [place] Do not drive yourself to the hospital. Summary Vertigo is the feeling that you or your surroundings are moving when they are not. Your health care provider will do tests to try to determine the cause of your vertigo. Follow instructions for home care. You may be told to avoid certain tasks, positions, or movements. Contact a health care provider if your medicines do not relieve your symptoms, or if you have a fever, nausea, vomiting, or changes in behavior. Get help right away if you have severe headaches or difficulty speaking, or you develop hearing or vision problems. This information is not intended to replace advice given to you by your health care provider. Make sure you discuss any questions you have with your health care provider. Document Revised: 09/01/2020 Document Reviewed: 09/01/2020 Elsevier Patient Education  2023 [employer] Inc[employer]  

## 2022-12-29 ENCOUNTER — Other Ambulatory Visit (HOSPITAL_BASED_OUTPATIENT_CLINIC_OR_DEPARTMENT_OTHER): Payer: Self-pay

## 2023-01-15 ENCOUNTER — Ambulatory Visit: Payer: Medicare (Managed Care) | Admitting: Family Medicine

## 2023-01-19 ENCOUNTER — Telehealth: Payer: Self-pay | Admitting: Family Medicine

## 2023-01-19 NOTE — Telephone Encounter (Signed)
Copied from CRM [id number]. Topic: Medicare AWV >> Jan 19, 2023 11:31 AM Payton Doughty wrote: Reason for CRM: Called patient to schedule Medicare Annual Wellness Visit (AWV). Left message for patient to call back and schedule Medicare Annual Wellness Visit (AWV).  Last date of AWV: 07/09/2021   Please schedule an appointment at any time with Kandis Cocking, LPN .  If any questions, please contact me.  Thank you ,  Verlee Rossetti; Care Guide Ambulatory Clinical Support [facility] Direct Dial: [phone]

## 2023-01-29 ENCOUNTER — Other Ambulatory Visit: Payer: Self-pay | Admitting: Family Medicine

## 2023-01-29 DIAGNOSIS — G9050 Complex regional pain syndrome I, unspecified: Secondary | ICD-10-CM

## 2023-01-29 DIAGNOSIS — M549 Dorsalgia, unspecified: Secondary | ICD-10-CM

## 2023-01-29 MED ORDER — HYDROCODONE-ACETAMINOPHEN 7.5-325 MG PO TABS
1.0000 | ORAL_TABLET | Freq: Three times a day (TID) | ORAL | 0 refills | Status: DC | PRN
  Filled 2023-01-29: qty 90, 30d supply, fill #0

## 2023-01-29 NOTE — Telephone Encounter (Signed)
Requesting: hydrocodone 7.5-325mg   Contract: 12/19/21 UDS: 12/19/21 Last Visit: 12/26/22 Next Visit: 03/27/23 Last Refill: 12/14/22 #90 and 0RF   Please Advise

## 2023-01-30 ENCOUNTER — Other Ambulatory Visit (HOSPITAL_BASED_OUTPATIENT_CLINIC_OR_DEPARTMENT_OTHER): Payer: Self-pay

## 2023-01-31 ENCOUNTER — Other Ambulatory Visit (HOSPITAL_BASED_OUTPATIENT_CLINIC_OR_DEPARTMENT_OTHER): Payer: Self-pay

## 2023-02-20 ENCOUNTER — Telehealth: Payer: Medicare (Managed Care) | Admitting: Physician Assistant

## 2023-02-20 DIAGNOSIS — B9689 Other specified bacterial agents as the cause of diseases classified elsewhere: Secondary | ICD-10-CM

## 2023-02-20 DIAGNOSIS — J019 Acute sinusitis, unspecified: Secondary | ICD-10-CM

## 2023-02-20 MED ORDER — AMOXICILLIN-POT CLAVULANATE 875-125 MG PO TABS: 1.0000 | ORAL_TABLET | Freq: Two times a day (BID) | ORAL | 0 refills | Status: DC

## 2023-02-20 MED ORDER — BENZONATATE 100 MG PO CAPS: 100.0000 mg | ORAL_CAPSULE | Freq: Three times a day (TID) | ORAL | 0 refills | Status: DC | PRN

## 2023-02-20 MED ORDER — FLUTICASONE PROPIONATE 50 MCG/ACT NA SUSP: 2.0000 | Freq: Every day | NASAL | 0 refills | Status: AC

## 2023-02-20 NOTE — Progress Notes (Signed)
Virtual Visit Consent   Beverly Erickson, you are scheduled for a virtual visit with a Beverly Erickson provider today. Just as with appointments in the office, your consent must be obtained to participate. Your consent will be active for this visit and any virtual visit you may have with one of our providers in the next 365 days. If you have a MyChart account, a copy of this consent can be sent to you electronically.  As this is a virtual visit, video technology does not allow for your provider to perform a traditional examination. This may limit your provider's ability to fully assess your condition. If your provider identifies any concerns that need to be evaluated in person or the need to arrange testing (such as labs, EKG, etc.), we will make arrangements to do so. Although advances in technology are sophisticated, we cannot ensure that it will always work on either your end or our end. If the connection with a video visit is poor, the visit may have to be switched to a telephone visit. With either a video or telephone visit, we are not always able to ensure that we have a secure connection.  By engaging in this virtual visit, you consent to the provision of healthcare and authorize for your insurance to be billed (if applicable) for the services provided during this visit. Depending on your insurance coverage, you may receive a charge related to this service.  I need to obtain your verbal consent now. Are you willing to proceed with your visit today? Beverly Erickson has provided verbal consent on 02/20/2023 for a virtual visit (video or telephone). Beverly Erickson, [place]  Date: 02/20/2023 9:35 AM  Virtual Visit via Video Note   I, Beverly Erickson, connected with  Beverly Erickson  ([id number], 12/10/1970) on 02/20/23 at  9:30 AM EDT by a video-enabled telemedicine application and verified that I am speaking with the correct person using two identifiers.  Location: Patient: Virtual Visit Location Patient:  Home Provider: Virtual Visit Location Provider: Home Office   I discussed the limitations of evaluation and management by telemedicine and the availability of in person appointments. The patient expressed understanding and agreed to proceed.    History of Present Illness: Beverly Erickson is a 52 y.o. who identifies as a female who was assigned female at birth, and is being seen today for URI symptoms starting around 2 weeks ago. 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 Notes head and chest congestion with cough that is productive of thick phlegm. Noting sinus pressure and headache, now with sinus pain. Notes some dry heaving due to cough. Some loose stool yesterday. Denies fever, chills. Denies sick contact.  OTC -- Tylenol sinus, cough drops.  HPI: HPI  Problems:  Patient Active Problem List   Diagnosis Date Noted   Vertigo 12/26/2022   Generalized abdominal pain 05/18/2022   RUQ pain 12/19/2021   High risk medication use 12/19/2021   Hot flashes 12/19/2021   Contact dermatitis 07/29/2021   Bruise 03/07/2021   Left hip pain 09/14/2020   Allergies 07/14/2020   Fatigue 01/22/2018   Tinea corporis 06/10/2017   Muscle cramp 03/07/2017   Low back pain 03/07/2017   Urinary frequency 03/06/2017   Neck pain, musculoskeletal 11/17/2016   Otitis media 11/17/2016   Breast cancer screening 08/13/2016   Abdominal pain, chronic, epigastric 08/13/2016   Witnessed episode of apnea  08/13/2016   Acute bacterial sinusitis 10/20/2015   Depression with anxiety 08/30/2015   Elevated blood pressure 08/11/2015   Atypical chest pain 05/23/2015   Preventative health care 07/05/2014   Constipation 05/25/2014   Lesion of tonsil 05/03/2014   Transient ischemic attack (TIA) 05/03/2014   Kidney stone 02/13/2014   Mid back pain on right side 02/13/2014   Hyperglycemia 01/14/2014   Hyperlipidemia, mixed  01/14/2014   Obesity    RSD (reflex sympathetic dystrophy)    GERD (gastroesophageal reflux disease) 12/05/2013   Complex regional pain syndrome I of lower limb 02/07/2013    Allergies:  Allergies  Allergen Reactions   Codeine Nausea Only   Mucinex [Guaifenesin Er] Other (See Comments)    Nightmares   Medications:  Current Outpatient Medications:    amoxicillin-clavulanate (AUGMENTIN) 875-125 MG tablet, Take 1 tablet by mouth 2 (two) times daily., Disp: 14 tablet, Rfl: 0   fluticasone (FLONASE) 50 MCG/ACT nasal spray, Place 2 sprays into both nostrils daily., Disp: 16 g, Rfl: 0   albuterol (PROVENTIL HFA;VENTOLIN HFA) 108 (90 Base) MCG/ACT inhaler, Inhale 2 puffs into the lungs every 6 (six) hours as needed for wheezing or shortness of breath., Disp: 2 Inhaler, Rfl: 2   albuterol (VENTOLIN HFA) 108 (90 Base) MCG/ACT inhaler, Inhale 2 puffs into the lungs every 6 (six) hours as needed for wheezing or shortness of breath., Disp: 18 g, Rfl: 2   ALPRAZolam (XANAX) 0.25 MG tablet, Take 1 tablet (0.25 mg total) by mouth 2 (two) times daily as needed for anxiety., Disp: 30 tablet, Rfl: 3   citalopram (CELEXA) 20 MG tablet, Take 1/2 tablet by mouth once daily for 1 week, then increase to a full tab once daily at bedtime, Disp: 30 tablet, Rfl: 0   HYDROcodone-acetaminophen (NORCO) 7.5-325 MG tablet, Take 1 tablet by mouth 3 (three) times daily as needed for moderate pain., Disp: 90 tablet, Rfl: 0   ibuprofen (ADVIL) 200 MG tablet, Take 200-400 mg by mouth every 6 (six) hours as needed for mild pain or headache., Disp: , Rfl:    meclizine (ANTIVERT) 25 MG tablet, Take 1 tablet (25 mg total) by mouth 3 (three) times daily as needed for dizziness., Disp: 30 tablet, Rfl: 0   nitroGLYCERIN (NITROSTAT) 0.4 MG SL tablet, Place 1 tablet (0.4 mg total) under the tongue every 5 (five) minutes as needed for chest pain., Disp: 25 tablet, Rfl: 1   omeprazole (PRILOSEC) 40 MG capsule, Take 1 capsule (40 mg total)  by mouth 2 (two) times daily. (Patient taking differently: Take 40 mg by mouth daily before breakfast.), Disp: 180 capsule, Rfl: 1   pramipexole (MIRAPEX) 1 MG tablet, Take 1 tablet (1 mg total) by mouth at bedtime., Disp: 30 tablet, Rfl: 2  Observations/Objective: Patient is well-developed, well-nourished in no acute distress.  Resting comfortably at home.  Head is normocephalic, atraumatic.  No labored breathing. Speech is clear and coherent with logical content.  Patient is alert and oriented at baseline.   Assessment and Plan: 1. Acute bacterial sinusitis - fluticasone (FLONASE) 50 MCG/ACT nasal spray; Place 2 sprays into both nostrils daily.  Dispense: 16 g; Refill: 0 - amoxicillin-clavulanate (AUGMENTIN) 875-125 MG tablet; Take 1 tablet by mouth 2 (two) times daily.  Dispense: 14 tablet; Refill: 0  Rx Augmentin.  Increase fluids.  Rest.  Saline nasal spray.  Probiotic.  Mucinex as directed.  Humidifier in bedroom. Flonase per orders. Ok to continue Tylenol sinus.  Call or return to clinic if  symptoms are not improving.   Follow Up Instructions: I discussed the assessment and treatment plan with the patient. The patient was provided an opportunity to ask questions and all were answered. The patient agreed with the plan and demonstrated an understanding of the instructions.  A copy of instructions were sent to the patient via MyChart unless otherwise noted below.   The patient was advised to call back or seek an in-person evaluation if the symptoms worsen or if the condition fails to improve as anticipated.  Time:  I spent 10 minutes with the patient via telehealth technology discussing the above problems/concerns.    Leeanne Rio, PA-C

## 2023-02-20 NOTE — Patient Instructions (Signed)
Koleen Distance, thank you for joining [name], PA-C for today's virtual visit.  While this provider is not your primary care provider (PCP), if your PCP is located in our provider database this encounter information will be shared with them immediately following your visit.   A [facility] MyChart account gives you access to today's visit and all your visits, tests, and labs performed at [facility] " click here if you don't have a [facility] MyChart account or go to [email or web address]  Consent: (Patient) Beverly Erickson provided verbal consent for this virtual visit at the beginning of the encounter.  Current Medications:  Current Outpatient Medications:    albuterol (PROVENTIL HFA;VENTOLIN HFA) 108 (90 Base) MCG/ACT inhaler, Inhale 2 puffs into the lungs every 6 (six) hours as needed for wheezing or shortness of breath., Disp: 2 Inhaler, Rfl: 2   albuterol (VENTOLIN HFA) 108 (90 Base) MCG/ACT inhaler, Inhale 2 puffs into the lungs every 6 (six) hours as needed for wheezing or shortness of breath., Disp: 18 g, Rfl: 2   ALPRAZolam (XANAX) 0.25 MG tablet, Take 1 tablet (0.25 mg total) by mouth 2 (two) times daily as needed for anxiety., Disp: 30 tablet, Rfl: 3   cetirizine (ZYRTEC) 10 MG tablet, Take 1 tablet (10 mg total) by mouth daily., Disp: 30 tablet, Rfl: 11   citalopram (CELEXA) 20 MG tablet, Take 1/2 tablet by mouth once daily for 1 week, then increase to a full tab once daily at bedtime, Disp: 30 tablet, Rfl: 0   fluticasone (FLONASE) 50 MCG/ACT nasal spray, Place 2 sprays into both nostrils daily., Disp: 16 g, Rfl: 6   HYDROcodone-acetaminophen (NORCO) 7.5-325 MG tablet, Take 1 tablet by mouth 3 (three) times daily as needed for moderate pain., Disp: 90 tablet, Rfl: 0   ibuprofen (ADVIL) 200 MG tablet, Take 200-400 mg by mouth every 6 (six) hours as needed for mild pain or headache., Disp: , Rfl:    meclizine (ANTIVERT) 25 MG tablet, Take 1 tablet (25 mg total)  by mouth 3 (three) times daily as needed for dizziness., Disp: 30 tablet, Rfl: 0   nitroGLYCERIN (NITROSTAT) 0.4 MG SL tablet, Place 1 tablet (0.4 mg total) under the tongue every 5 (five) minutes as needed for chest pain., Disp: 25 tablet, Rfl: 1   omeprazole (PRILOSEC) 40 MG capsule, Take 1 capsule (40 mg total) by mouth 2 (two) times daily. (Patient taking differently: Take 40 mg by mouth daily before breakfast.), Disp: 180 capsule, Rfl: 1   pramipexole (MIRAPEX) 1 MG tablet, Take 1 tablet (1 mg total) by mouth at bedtime., Disp: 30 tablet, Rfl: 2   Medications ordered in this encounter:  No orders of the defined types were placed in this encounter.    *If you need refills on other medications prior to your next appointment, please contact your pharmacy*  Follow-Up: Call back or seek an in-person evaluation if the symptoms worsen or if the condition fails to improve as anticipated.  [facility] [phone]  Other Instructions Take antibiotic (Augmentin) as directed.  Increase fluids.  Get plenty of rest. Use Mucinex for congestion. Use Tessalon as directed and start Flonase once daily. Take a daily probiotic (I recommend Align or Culturelle, but even Activia Yogurt may be beneficial).  A humidifier placed in the bedroom may offer some relief for a dry, scratchy throat of nasal irritation.  Read information below on acute bronchitis. Please call or return to clinic if symptoms are not improving.  Acute Bronchitis Bronchitis is when the airways that extend from the windpipe into the lungs get red, puffy, and painful (inflamed). Bronchitis often causes thick spit (mucus) to develop. This leads to a cough. A cough is the most common symptom of bronchitis. In acute bronchitis, the condition usually begins suddenly and goes away over time (usually in 2 weeks). Smoking, allergies, and asthma can make bronchitis worse. Repeated episodes of bronchitis may cause more lung  problems.  HOME CARE Rest. Drink enough fluids to keep your pee (urine) clear or pale yellow (unless you need to limit fluids as told by your doctor). Only take over-the-counter or prescription medicines as told by your doctor. Avoid smoking and secondhand smoke. These can make bronchitis worse. If you are a smoker, think about using nicotine gum or skin patches. Quitting smoking will help your lungs heal faster. Reduce the chance of getting bronchitis again by: Washing your hands often. Avoiding people with cold symptoms. Trying not to touch your hands to your mouth, nose, or eyes. Follow up with your doctor as told.  GET HELP IF: Your symptoms do not improve after 1 week of treatment. Symptoms include: Cough. Fever. Coughing up thick spit. Body aches. Chest congestion. Chills. Shortness of breath. Sore throat.  GET HELP RIGHT AWAY IF:  You have an increased fever. You have chills. You have severe shortness of breath. You have bloody thick spit (sputum). You throw up (vomit) often. You lose too much body fluid (dehydration). You have a severe headache. You faint.  MAKE SURE YOU:  Understand these instructions. Will watch your condition. Will get help right away if you are not doing well or get worse. Document Released: 03/20/2008 Document Revised: 06/04/2013 Document Reviewed: 03/25/2013 [facility] Patient Information 2015 [place], [place]. This information is not intended to replace advice given to you by your health care provider. Make sure you discuss any questions you have with your health care provider.    If you have been instructed to have an in-person evaluation today at a local Urgent Care facility, please use the link below. It will take you to a list of all of our available [facility], including address, phone number and hours of operation. Please do not delay care.  [facility]  If you or a family member do not have a primary care  provider, use the link below to schedule a visit and establish care. When you choose a [facility] primary care physician or advanced practice provider, you gain a long-term partner in health. Find a Primary Care Provider  Learn more about [facility] in-office and virtual care options: [facility] - Get Care Now

## 2023-02-28 ENCOUNTER — Other Ambulatory Visit: Payer: Self-pay | Admitting: Family Medicine

## 2023-02-28 ENCOUNTER — Other Ambulatory Visit: Payer: Self-pay

## 2023-02-28 DIAGNOSIS — G9050 Complex regional pain syndrome I, unspecified: Secondary | ICD-10-CM

## 2023-02-28 DIAGNOSIS — M549 Dorsalgia, unspecified: Secondary | ICD-10-CM

## 2023-02-28 MED ORDER — HYDROCODONE-ACETAMINOPHEN 7.5-325 MG PO TABS
1.0000 | ORAL_TABLET | Freq: Three times a day (TID) | ORAL | 0 refills | Status: DC | PRN
  Filled 2023-02-28: qty 90, 30d supply, fill #0

## 2023-02-28 NOTE — Telephone Encounter (Signed)
Requesting: hydrocodone 7.5-325mg   Contract: 12/19/21 UDS: 12/19/21 Last Visit: 12/26/22 Next Visit: 03/27/23 Last Refill: 07/28/23 #90 and 0RF   Please Advise

## 2023-03-01 ENCOUNTER — Other Ambulatory Visit: Payer: Self-pay

## 2023-03-01 ENCOUNTER — Other Ambulatory Visit (HOSPITAL_BASED_OUTPATIENT_CLINIC_OR_DEPARTMENT_OTHER): Payer: Self-pay

## 2023-03-27 ENCOUNTER — Ambulatory Visit: Payer: Medicare (Managed Care) | Admitting: Family Medicine

## 2023-03-28 ENCOUNTER — Telehealth: Payer: Self-pay | Admitting: Family Medicine

## 2023-03-28 NOTE — Telephone Encounter (Signed)
Copied from CRM [id number]. Topic: Medicare AWV >> Mar 28, 2023 11:19 AM Payton Doughty wrote: Reason for CRM: LM 03/28/2023 to schedule AWV  Verlee Rossetti; Care Guide Ambulatory Clinical Support [place] [facility] Direct Dial: [phone]

## 2023-04-01 ENCOUNTER — Other Ambulatory Visit: Payer: Self-pay | Admitting: Family Medicine

## 2023-04-01 DIAGNOSIS — G9050 Complex regional pain syndrome I, unspecified: Secondary | ICD-10-CM

## 2023-04-01 DIAGNOSIS — M549 Dorsalgia, unspecified: Secondary | ICD-10-CM

## 2023-04-02 ENCOUNTER — Telehealth: Payer: Self-pay

## 2023-04-02 ENCOUNTER — Other Ambulatory Visit (HOSPITAL_BASED_OUTPATIENT_CLINIC_OR_DEPARTMENT_OTHER): Payer: Self-pay

## 2023-04-02 MED ORDER — HYDROCODONE-ACETAMINOPHEN 7.5-325 MG PO TABS
1.0000 | ORAL_TABLET | Freq: Three times a day (TID) | ORAL | 0 refills | Status: DC | PRN
  Filled 2023-04-02: qty 90, 30d supply, fill #0

## 2023-04-02 NOTE — Telephone Encounter (Signed)
Requesting: HYDROcodone-acetaminophen (NORCO) 7.5-325 MG tablet Take 1 tablet by mouth 3 (three) times daily as needed for moderate pain.  Contract:11/09/2022 UDS: 11/09/2022 Last Visit: 12/26/2022 Next Visit: 04/12/2023 Last Refill: 02/28/2023 #90 no refills  Please Advise

## 2023-04-02 NOTE — Telephone Encounter (Signed)
PA initiated via Covermymeds; KEY: [id number].   PA approved.   [id number];Review Type:Prior Auth;Coverage Start Date:03/03/2023;Coverage End Date:04/01/2024; Authorization Expiration Date: 03/31/2024

## 2023-04-03 ENCOUNTER — Other Ambulatory Visit (HOSPITAL_BASED_OUTPATIENT_CLINIC_OR_DEPARTMENT_OTHER): Payer: Self-pay

## 2023-04-11 NOTE — Assessment & Plan Note (Deleted)
Encouraged moist heat and gentle stretching as tolerated. May try NSAIDs and prescription meds as directed and report if symptoms worsen or seek immediate care 

## 2023-04-11 NOTE — Progress Notes (Deleted)
Subjective:    Patient ID: Beverly Erickson, female    DOB: May 01, 1971, 52 y.o.   MRN: [id number]  No chief complaint on file.   HPI Discussed the use of AI scribe software for clinical note transcription with the patient, who gave verbal consent to proceed.  History of Present Illness     Patient is a 52 yo female in today for follow up on chronic medical concerns. No recent febrile illness or hospitalizations. Denies CP/palp/SOB/HA/congestion/fevers/GI or GU c/o. Taking meds as prescribed        Past Medical History:  Diagnosis Date   Anemia    Chicken pox    Depression    Depression with anxiety 08/30/2015   GERD (gastroesophageal reflux disease)    Kidney stone 02/13/2014   Right, small   Neck pain, musculoskeletal 11/17/2016   Obesity    Other and unspecified hyperlipidemia 01/14/2014   Otitis media 11/17/2016   Preventative health care 07/05/2014   RSD (reflex sympathetic dystrophy)    Tinea corporis 06/10/2017   Tobacco use disorder 01/14/2014   1ppd   Unspecified constipation 05/25/2014   Urinary frequency 03/06/2017    Past Surgical History:  Procedure Laterality Date   CESAREAN SECTION  2000   LAPAROSCOPIC APPENDECTOMY N/A 05/22/2022   Procedure: APPENDECTOMY LAPAROSCOPIC;  Surgeon: Abigail Miyamoto, MD;  Location: [facility];  Service: General;  Laterality: N/A;   plate and screws in left arm  2005   humerus   WISDOM TOOTH EXTRACTION  52 yrs old    Family History  Problem Relation Age of Onset   Arthritis Mother        Living   Hypertension Mother    Thrombocytopenia Mother    Hyperlipidemia Father    Hypertension Father    Thrombocytopenia Maternal Grandmother    Stroke Maternal Grandmother    Hypertension Maternal Grandmother    Alcohol abuse Maternal Grandfather    Heart disease Maternal Grandfather    Parkinson's disease Maternal Grandfather    Alcohol abuse Paternal Grandfather    Cancer Paternal Grandfather    Hypothyroidism Daughter    Diabetes Daughter         type 1   Dementia Paternal Grandmother     Social History   Socioeconomic History   Marital status: Married    Spouse name: Not on file   Number of children: 3   Years of education: Not on file   Highest education level: Not on file  Occupational History   Occupation: Conservation officer, nature    Comment: Disability, but works at [employer] part time  Tobacco Use   Smoking status: Every Day    Packs/day: 1.00    Years: 20.00    Additional pack years: 0.00    Total pack years: 20.00    Types: Cigarettes   Smokeless tobacco: Never  Substance and Sexual Activity   Alcohol use: Not Currently    Comment: once a year.   Drug use: No   Sexual activity: Not on file    Comment: lives with son, 60 yo daughter, boyfriend, no dietary  Other Topics Concern   Not on file  Social History Narrative   Children live nearby   Social Determinants of Health   Financial Resource Strain: Medium Risk (07/09/2021)   Overall Financial Resource Strain (CARDIA)    Difficulty of Paying Living Expenses: Somewhat hard  Food Insecurity: Food Insecurity Present (07/09/2021)   Hunger Vital Sign    Worried About Programme researcher, broadcasting/film/video in  the Last Year: Sometimes true    Ran Out of Food in the Last Year: Never true  Transportation Needs: No Transportation Needs (07/09/2021)   PRAPARE - Administrator, Civil Service (Medical): No    Lack of Transportation (Non-Medical): No  Physical Activity: Inactive (07/09/2021)   Exercise Vital Sign    Days of Exercise per Week: 0 days    Minutes of Exercise per Session: 0 min  Stress: Stress Concern Present (07/09/2021)   [employer] of Occupational Health - Occupational Stress Questionnaire    Feeling of Stress : To some extent  Social Connections: Moderately Isolated (07/09/2021)   Social Connection and Isolation Panel [NHANES]    Frequency of Communication with Friends and Family: More than three times a week    Frequency of Social Gatherings with Friends  and Family: More than three times a week    Attends Religious Services: Never    Database administrator or Organizations: No    Attends Banker Meetings: Never    Marital Status: Married  Catering manager Violence: Not At Risk (07/09/2021)   Humiliation, Afraid, Rape, and Kick questionnaire    Fear of Current or Ex-Partner: No    Emotionally Abused: No    Physically Abused: No    Sexually Abused: No    Outpatient Medications Prior to Visit  Medication Sig Dispense Refill   albuterol (PROVENTIL HFA;VENTOLIN HFA) 108 (90 Base) MCG/ACT inhaler Inhale 2 puffs into the lungs every 6 (six) hours as needed for wheezing or shortness of breath. 2 Inhaler 2   albuterol (VENTOLIN HFA) 108 (90 Base) MCG/ACT inhaler Inhale 2 puffs into the lungs every 6 (six) hours as needed for wheezing or shortness of breath. 18 g 2   ALPRAZolam (XANAX) 0.25 MG tablet Take 1 tablet (0.25 mg total) by mouth 2 (two) times daily as needed for anxiety. 30 tablet 3   amoxicillin-clavulanate (AUGMENTIN) 875-125 MG tablet Take 1 tablet by mouth 2 (two) times daily. 14 tablet 0   benzonatate (TESSALON) 100 MG capsule Take 1 capsule (100 mg total) by mouth 3 (three) times daily as needed for cough. 30 capsule 0   citalopram (CELEXA) 20 MG tablet Take 1/2 tablet by mouth once daily for 1 week, then increase to a full tab once daily at bedtime 30 tablet 0   fluticasone (FLONASE) 50 MCG/ACT nasal spray Place 2 sprays into both nostrils daily. 16 g 0   HYDROcodone-acetaminophen (NORCO) 7.5-325 MG tablet Take 1 tablet by mouth 3 (three) times daily as needed for moderate pain. 90 tablet 0   ibuprofen (ADVIL) 200 MG tablet Take 200-400 mg by mouth every 6 (six) hours as needed for mild pain or headache.     meclizine (ANTIVERT) 25 MG tablet Take 1 tablet (25 mg total) by mouth 3 (three) times daily as needed for dizziness. 30 tablet 0   nitroGLYCERIN (NITROSTAT) 0.4 MG SL tablet Place 1 tablet (0.4 mg total) under the  tongue every 5 (five) minutes as needed for chest pain. 25 tablet 1   omeprazole (PRILOSEC) 40 MG capsule Take 1 capsule (40 mg total) by mouth 2 (two) times daily. (Patient taking differently: Take 40 mg by mouth daily before breakfast.) 180 capsule 1   pramipexole (MIRAPEX) 1 MG tablet Take 1 tablet (1 mg total) by mouth at bedtime. 30 tablet 2   No facility-administered medications prior to visit.    Allergies  Allergen Reactions   Codeine Nausea Only  Mucinex [Guaifenesin Er] Other (See Comments)    Nightmares    ROS     Objective:    Physical Exam  There were no vitals taken for this visit. Wt Readings from Last 3 Encounters:  12/26/22 241 lb 6.4 oz (109.5 kg)  11/09/22 244 lb 6.4 oz (110.9 kg)  05/28/22 229 lb 4.5 oz (104 kg)    Diabetic Foot Exam - Simple   No data filed    Lab Results  Component Value Date   WBC 7.0 12/26/2022   HGB 14.0 12/26/2022   HCT 41.9 12/26/2022   PLT 279.0 12/26/2022   GLUCOSE 87 12/26/2022   CHOL 144 12/26/2022   TRIG 67.0 12/26/2022   HDL 37.50 (L) 12/26/2022   LDLCALC 93 12/26/2022   ALT 21 12/26/2022   AST 17 12/26/2022   NA 140 12/26/2022   K 4.1 12/26/2022   CL 109 12/26/2022   CREATININE 0.64 12/26/2022   BUN 15 12/26/2022   CO2 25 12/26/2022   TSH 1.58 12/26/2022   HGBA1C 5.5 12/26/2022    Lab Results  Component Value Date   TSH 1.58 12/26/2022   Lab Results  Component Value Date   WBC 7.0 12/26/2022   HGB 14.0 12/26/2022   HCT 41.9 12/26/2022   MCV 92.4 12/26/2022   PLT 279.0 12/26/2022   Lab Results  Component Value Date   NA 140 12/26/2022   K 4.1 12/26/2022   CO2 25 12/26/2022   GLUCOSE 87 12/26/2022   BUN 15 12/26/2022   CREATININE 0.64 12/26/2022   BILITOT 0.3 12/26/2022   ALKPHOS 57 12/26/2022   AST 17 12/26/2022   ALT 21 12/26/2022   PROT 6.3 12/26/2022   ALBUMIN 3.8 12/26/2022   CALCIUM 9.0 12/26/2022   ANIONGAP 6 05/28/2022   GFR 102.77 12/26/2022   Lab Results  Component Value  Date   CHOL 144 12/26/2022   Lab Results  Component Value Date   HDL 37.50 (L) 12/26/2022   Lab Results  Component Value Date   LDLCALC 93 12/26/2022   Lab Results  Component Value Date   TRIG 67.0 12/26/2022   Lab Results  Component Value Date   CHOLHDL 4 12/26/2022   Lab Results  Component Value Date   HGBA1C 5.5 12/26/2022       Assessment & Plan:  Complex regional pain syndrome type 1 of left lower extremity Assessment & Plan: Encouraged moist heat and gentle stretching as tolerated. May try NSAIDs and prescription meds as directed and report if symptoms worsen or seek immediate care      Assessment and Plan              Danise Edge, MD

## 2023-04-12 ENCOUNTER — Ambulatory Visit: Payer: Medicare (Managed Care) | Admitting: Family Medicine

## 2023-04-12 DIAGNOSIS — G90522 Complex regional pain syndrome I of left lower limb: Secondary | ICD-10-CM

## 2023-04-30 ENCOUNTER — Other Ambulatory Visit (HOSPITAL_BASED_OUTPATIENT_CLINIC_OR_DEPARTMENT_OTHER): Payer: Self-pay

## 2023-04-30 ENCOUNTER — Other Ambulatory Visit: Payer: Self-pay | Admitting: Family Medicine

## 2023-04-30 DIAGNOSIS — G9050 Complex regional pain syndrome I, unspecified: Secondary | ICD-10-CM

## 2023-04-30 DIAGNOSIS — M549 Dorsalgia, unspecified: Secondary | ICD-10-CM

## 2023-04-30 MED ORDER — HYDROCODONE-ACETAMINOPHEN 7.5-325 MG PO TABS
1.0000 | ORAL_TABLET | Freq: Three times a day (TID) | ORAL | 0 refills | Status: DC | PRN
  Filled 2023-04-30: qty 90, 30d supply, fill #0

## 2023-04-30 NOTE — Telephone Encounter (Signed)
Requesting: [employer]co Contract: 12/19/2021 UDS: 12/19/2021 Last Visit: 12/26/2022 Next Visit: 07/24/2023 Last Refill: 04/02/2023  Please Advise

## 2023-05-01 ENCOUNTER — Other Ambulatory Visit: Payer: Self-pay

## 2023-05-31 ENCOUNTER — Other Ambulatory Visit: Payer: Self-pay | Admitting: Family Medicine

## 2023-05-31 ENCOUNTER — Other Ambulatory Visit (HOSPITAL_BASED_OUTPATIENT_CLINIC_OR_DEPARTMENT_OTHER): Payer: Self-pay

## 2023-05-31 DIAGNOSIS — M549 Dorsalgia, unspecified: Secondary | ICD-10-CM

## 2023-05-31 DIAGNOSIS — G9050 Complex regional pain syndrome I, unspecified: Secondary | ICD-10-CM

## 2023-05-31 MED ORDER — HYDROCODONE-ACETAMINOPHEN 7.5-325 MG PO TABS
1.0000 | ORAL_TABLET | Freq: Three times a day (TID) | ORAL | 0 refills | Status: DC | PRN
  Filled 2023-05-31: qty 90, 30d supply, fill #0

## 2023-05-31 NOTE — Telephone Encounter (Signed)
Requesting: [facility]co Contract: 11/09/2022 UDS: N/A Last Visit: 12/26/2022 Next Visit: 07/24/2023 Last Refill:04/30/2023  Please Advise

## 2023-06-01 ENCOUNTER — Encounter: Payer: Self-pay | Admitting: Physician Assistant

## 2023-06-01 ENCOUNTER — Ambulatory Visit (INDEPENDENT_AMBULATORY_CARE_PROVIDER_SITE_OTHER): Payer: Medicare (Managed Care) | Admitting: Physician Assistant

## 2023-06-01 ENCOUNTER — Other Ambulatory Visit (HOSPITAL_BASED_OUTPATIENT_CLINIC_OR_DEPARTMENT_OTHER): Payer: Self-pay

## 2023-06-01 VITALS — BP 135/90 | HR 77 | Temp 98.6°F | Ht 67.0 in | Wt 239.8 lb

## 2023-06-01 DIAGNOSIS — R0981 Nasal congestion: Secondary | ICD-10-CM

## 2023-06-01 DIAGNOSIS — U071 COVID-19: Secondary | ICD-10-CM | POA: Diagnosis not present

## 2023-06-01 LAB — POC COVID19 BINAXNOW: SARS Coronavirus 2 Ag: POSITIVE — AB

## 2023-06-01 MED ORDER — BENZONATATE 100 MG PO CAPS
100.0000 mg | ORAL_CAPSULE | Freq: Two times a day (BID) | ORAL | 0 refills | Status: DC | PRN
  Filled 2023-06-01: qty 20, 10d supply, fill #0

## 2023-06-01 MED ORDER — BUDESONIDE-FORMOTEROL FUMARATE 160-4.5 MCG/ACT IN AERO
2.0000 | INHALATION_SPRAY | Freq: Two times a day (BID) | RESPIRATORY_TRACT | 3 refills | Status: DC
  Filled 2023-06-01: qty 10.2, 30d supply, fill #0

## 2023-06-01 MED ORDER — NIRMATRELVIR/RITONAVIR (PAXLOVID)TABLET
3.0000 | ORAL_TABLET | Freq: Two times a day (BID) | ORAL | 0 refills | Status: AC
  Filled 2023-06-01: qty 30, 5d supply, fill #0

## 2023-06-01 NOTE — Progress Notes (Signed)
Established patient visit   Patient: Beverly Erickson   DOB: 1971-05-05   52 y.o. Female  MRN: [id number] Visit Date: 06/01/2023  Today's healthcare provider: Alfredia Ferguson, PA-C   Cc. Nasal congestion, loss of smell/taste  Subjective    HPI  Pt reports nasal congestion, loss of taste/smell, body aches, chills x 1 day l, multiple sick contacts at home. Reports using albuterol inhaler multiple times a day, still feeling SOB.   Medications: Outpatient Medications Prior to Visit  Medication Sig   albuterol (PROVENTIL HFA;VENTOLIN HFA) 108 (90 Base) MCG/ACT inhaler Inhale 2 puffs into the lungs every 6 (six) hours as needed for wheezing or shortness of breath.   albuterol (VENTOLIN HFA) 108 (90 Base) MCG/ACT inhaler Inhale 2 puffs into the lungs every 6 (six) hours as needed for wheezing or shortness of breath.   ALPRAZolam (XANAX) 0.25 MG tablet Take 1 tablet (0.25 mg total) by mouth 2 (two) times daily as needed for anxiety.   amoxicillin-clavulanate (AUGMENTIN) 875-125 MG tablet Take 1 tablet by mouth 2 (two) times daily.   citalopram (CELEXA) 20 MG tablet Take 1/2 tablet by mouth once daily for 1 week, then increase to a full tab once daily at bedtime   fluticasone (FLONASE) 50 MCG/ACT nasal spray Place 2 sprays into both nostrils daily.   HYDROcodone-acetaminophen (NORCO) 7.5-325 MG tablet Take 1 tablet by mouth 3 (three) times daily as needed for moderate pain.   ibuprofen (ADVIL) 200 MG tablet Take 200-400 mg by mouth every 6 (six) hours as needed for mild pain or headache.   meclizine (ANTIVERT) 25 MG tablet Take 1 tablet (25 mg total) by mouth 3 (three) times daily as needed for dizziness.   nitroGLYCERIN (NITROSTAT) 0.4 MG SL tablet Place 1 tablet (0.4 mg total) under the tongue every 5 (five) minutes as needed for chest pain.   omeprazole (PRILOSEC) 40 MG capsule Take 1 capsule (40 mg total) by mouth 2 (two) times daily. (Patient taking differently: Take 40 mg by mouth daily  before breakfast.)   pramipexole (MIRAPEX) 1 MG tablet Take 1 tablet (1 mg total) by mouth at bedtime.   [DISCONTINUED] benzonatate (TESSALON) 100 MG capsule Take 1 capsule (100 mg total) by mouth 3 (three) times daily as needed for cough.   No facility-administered medications prior to visit.    Review of Systems  Constitutional:  Positive for fatigue. Negative for fever.  HENT:  Positive for congestion and sinus pressure.   Respiratory:  Positive for cough. Negative for shortness of breath.   Cardiovascular:  Negative for chest pain and leg swelling.  Gastrointestinal:  Negative for abdominal pain.  Neurological:  Negative for dizziness and headaches.      Objective    BP (!) 135/90   Pulse 77   Temp 98.6 F (37 C)   Ht 5\' 7"  (1.702 m)   Wt 239 lb 12.8 oz (108.8 kg)   SpO2 98%   BMI 37.56 kg/m   Physical Exam Constitutional:      General: She is awake.     Appearance: She is well-developed.  HENT:     Head: Normocephalic.  Eyes:     Conjunctiva/sclera: Conjunctivae normal.  Cardiovascular:     Rate and Rhythm: Normal rate and regular rhythm.     Heart sounds: Normal heart sounds.  Pulmonary:     Effort: Pulmonary effort is normal.     Breath sounds: Normal breath sounds. No wheezing, rhonchi or rales.  Skin:  General: Skin is warm.  Neurological:     Mental Status: She is alert and oriented to person, place, and time.  Psychiatric:        Attention and Perception: Attention normal.        Mood and Affect: Mood normal.        Speech: Speech normal.        Behavior: Behavior is cooperative.      Results for orders placed or performed in visit on 06/01/23  POC COVID-19  Result Value Ref Range   SARS Coronavirus 2 Ag Positive (A) Negative    Assessment & Plan     1. Nasal congestion Covid + - POC COVID-19  2. COVID-19 Lungs cta Advised to hydrate, rest Rx paxlovid advised to d/c xanax while taking.   Rx symbicort to use 1-2 times daily for SOB.  No wheezing appreciated.  Rx tessalon for cough. - budesonide-formoterol (SYMBICORT) 160-4.5 MCG/ACT inhaler; Inhale 2 puffs into the lungs 2 (two) times daily.  Dispense: 1 each; Refill: 3 - nirmatrelvir/ritonavir (PAXLOVID) 20 x 150 MG & 10 x 100MG  TABS; Take 3 tablets by mouth 2 (two) times daily for 5 days. (Take nirmatrelvir 150 mg two tablets twice daily for 5 days and ritonavir 100 mg one tablet twice daily for 5 days)  Dispense: 30 tablet; Refill: 0 - benzonatate (TESSALON) 100 MG capsule; Take 1 capsule (100 mg total) by mouth 2 (two) times daily as needed for cough.  Dispense: 20 capsule; Refill: 0   Return if symptoms worsen or fail to improve.      I, Alfredia Ferguson, PA-C have reviewed all documentation for this visit. The documentation on  06/01/23   for the exam, diagnosis, procedures, and orders are all accurate and complete.   Alfredia Ferguson, PA-C  [facility] [phone] (phone) [phone] (fax)  [facility]

## 2023-06-04 ENCOUNTER — Other Ambulatory Visit (HOSPITAL_BASED_OUTPATIENT_CLINIC_OR_DEPARTMENT_OTHER): Payer: Self-pay

## 2023-06-05 ENCOUNTER — Other Ambulatory Visit (HOSPITAL_BASED_OUTPATIENT_CLINIC_OR_DEPARTMENT_OTHER): Payer: Self-pay

## 2023-06-06 ENCOUNTER — Other Ambulatory Visit (HOSPITAL_BASED_OUTPATIENT_CLINIC_OR_DEPARTMENT_OTHER): Payer: Self-pay

## 2023-06-07 ENCOUNTER — Other Ambulatory Visit (HOSPITAL_BASED_OUTPATIENT_CLINIC_OR_DEPARTMENT_OTHER): Payer: Self-pay

## 2023-06-08 ENCOUNTER — Other Ambulatory Visit (HOSPITAL_BASED_OUTPATIENT_CLINIC_OR_DEPARTMENT_OTHER): Payer: Self-pay

## 2023-06-11 ENCOUNTER — Other Ambulatory Visit (HOSPITAL_BASED_OUTPATIENT_CLINIC_OR_DEPARTMENT_OTHER): Payer: Self-pay

## 2023-06-12 ENCOUNTER — Other Ambulatory Visit (HOSPITAL_BASED_OUTPATIENT_CLINIC_OR_DEPARTMENT_OTHER): Payer: Self-pay

## 2023-06-19 ENCOUNTER — Other Ambulatory Visit (HOSPITAL_BASED_OUTPATIENT_CLINIC_OR_DEPARTMENT_OTHER): Payer: Self-pay

## 2023-06-20 ENCOUNTER — Other Ambulatory Visit (HOSPITAL_BASED_OUTPATIENT_CLINIC_OR_DEPARTMENT_OTHER): Payer: Self-pay

## 2023-06-26 ENCOUNTER — Other Ambulatory Visit (HOSPITAL_BASED_OUTPATIENT_CLINIC_OR_DEPARTMENT_OTHER): Payer: Self-pay

## 2023-06-27 ENCOUNTER — Other Ambulatory Visit (HOSPITAL_BASED_OUTPATIENT_CLINIC_OR_DEPARTMENT_OTHER): Payer: Self-pay

## 2023-06-28 ENCOUNTER — Other Ambulatory Visit (HOSPITAL_BASED_OUTPATIENT_CLINIC_OR_DEPARTMENT_OTHER): Payer: Self-pay

## 2023-06-29 ENCOUNTER — Other Ambulatory Visit (HOSPITAL_BASED_OUTPATIENT_CLINIC_OR_DEPARTMENT_OTHER): Payer: Self-pay

## 2023-06-29 ENCOUNTER — Other Ambulatory Visit: Payer: Self-pay | Admitting: Family Medicine

## 2023-06-29 DIAGNOSIS — G9050 Complex regional pain syndrome I, unspecified: Secondary | ICD-10-CM

## 2023-06-29 DIAGNOSIS — M549 Dorsalgia, unspecified: Secondary | ICD-10-CM

## 2023-06-29 MED ORDER — HYDROCODONE-ACETAMINOPHEN 7.5-325 MG PO TABS
1.0000 | ORAL_TABLET | Freq: Three times a day (TID) | ORAL | 0 refills | Status: DC | PRN
  Filled 2023-06-29: qty 90, 30d supply, fill #0

## 2023-06-29 NOTE — Telephone Encounter (Signed)
Requesting: Norco Contract: 11/09/2022 UDS: N/A Last Visit: 06/01/2023 Next Visit: 07/24/2023 Last Refill:05/31/2023 #90 no refill   Please Advise

## 2023-07-02 ENCOUNTER — Other Ambulatory Visit (HOSPITAL_BASED_OUTPATIENT_CLINIC_OR_DEPARTMENT_OTHER): Payer: Self-pay

## 2023-07-03 ENCOUNTER — Other Ambulatory Visit (HOSPITAL_BASED_OUTPATIENT_CLINIC_OR_DEPARTMENT_OTHER): Payer: Self-pay

## 2023-07-05 ENCOUNTER — Other Ambulatory Visit (HOSPITAL_BASED_OUTPATIENT_CLINIC_OR_DEPARTMENT_OTHER): Payer: Self-pay

## 2023-07-22 NOTE — Assessment & Plan Note (Deleted)
Encourage heart healthy diet such as MIND or DASH diet, increase exercise, avoid trans fats, simple carbohydrates and processed foods, consider a krill or fish or flaxseed oil cap daily.  °

## 2023-07-22 NOTE — Assessment & Plan Note (Deleted)
Hydrate and monitor 

## 2023-07-22 NOTE — Assessment & Plan Note (Deleted)
hgba1c acceptable, minimize simple carbs. Increase exercise as tolerated.  

## 2023-07-22 NOTE — Assessment & Plan Note (Deleted)
Continues to struggle with daily pain but is trying to stay as active as able.

## 2023-07-24 ENCOUNTER — Ambulatory Visit: Payer: Medicare (Managed Care) | Admitting: Family Medicine

## 2023-07-24 DIAGNOSIS — N200 Calculus of kidney: Secondary | ICD-10-CM

## 2023-07-24 DIAGNOSIS — R252 Cramp and spasm: Secondary | ICD-10-CM

## 2023-07-24 DIAGNOSIS — E782 Mixed hyperlipidemia: Secondary | ICD-10-CM

## 2023-07-24 DIAGNOSIS — G9050 Complex regional pain syndrome I, unspecified: Secondary | ICD-10-CM

## 2023-07-24 DIAGNOSIS — R739 Hyperglycemia, unspecified: Secondary | ICD-10-CM

## 2023-07-30 ENCOUNTER — Other Ambulatory Visit: Payer: Self-pay | Admitting: Family Medicine

## 2023-07-30 DIAGNOSIS — M549 Dorsalgia, unspecified: Secondary | ICD-10-CM

## 2023-07-30 DIAGNOSIS — G9050 Complex regional pain syndrome I, unspecified: Secondary | ICD-10-CM

## 2023-07-30 MED ORDER — HYDROCODONE-ACETAMINOPHEN 7.5-325 MG PO TABS
1.0000 | ORAL_TABLET | Freq: Three times a day (TID) | ORAL | 0 refills | Status: DC | PRN
  Filled 2023-07-30: qty 90, 30d supply, fill #0

## 2023-07-31 ENCOUNTER — Other Ambulatory Visit: Payer: Self-pay

## 2023-07-31 ENCOUNTER — Other Ambulatory Visit (HOSPITAL_BASED_OUTPATIENT_CLINIC_OR_DEPARTMENT_OTHER): Payer: Self-pay

## 2023-08-31 ENCOUNTER — Other Ambulatory Visit: Payer: Self-pay | Admitting: Family Medicine

## 2023-08-31 DIAGNOSIS — G9050 Complex regional pain syndrome I, unspecified: Secondary | ICD-10-CM

## 2023-08-31 DIAGNOSIS — K219 Gastro-esophageal reflux disease without esophagitis: Secondary | ICD-10-CM

## 2023-08-31 DIAGNOSIS — M549 Dorsalgia, unspecified: Secondary | ICD-10-CM

## 2023-09-03 ENCOUNTER — Other Ambulatory Visit (HOSPITAL_BASED_OUTPATIENT_CLINIC_OR_DEPARTMENT_OTHER): Payer: Self-pay

## 2023-09-03 MED ORDER — HYDROCODONE-ACETAMINOPHEN 7.5-325 MG PO TABS
1.0000 | ORAL_TABLET | Freq: Three times a day (TID) | ORAL | 0 refills | Status: DC | PRN
  Filled 2023-09-03: qty 90, 30d supply, fill #0

## 2023-09-03 MED ORDER — OMEPRAZOLE 40 MG PO CPDR
40.0000 mg | DELAYED_RELEASE_CAPSULE | Freq: Two times a day (BID) | ORAL | 1 refills | Status: AC
  Filled 2023-09-03: qty 180, 90d supply, fill #0
  Filled 2024-08-12: qty 180, 90d supply, fill #1

## 2023-09-03 NOTE — Telephone Encounter (Signed)
Requesting: Norco 7.5-325 Contract: 11/09/2022 UDS: 12/19/2021 Last Visit: 12/26/2022 Next Visit: N/A Last Refill: 07/30/2023  Please Advise

## 2023-10-01 ENCOUNTER — Other Ambulatory Visit: Payer: Self-pay | Admitting: Family

## 2023-10-01 DIAGNOSIS — M549 Dorsalgia, unspecified: Secondary | ICD-10-CM

## 2023-10-01 DIAGNOSIS — G9050 Complex regional pain syndrome I, unspecified: Secondary | ICD-10-CM

## 2023-10-04 ENCOUNTER — Other Ambulatory Visit: Payer: Self-pay | Admitting: Family Medicine

## 2023-10-04 ENCOUNTER — Other Ambulatory Visit (HOSPITAL_BASED_OUTPATIENT_CLINIC_OR_DEPARTMENT_OTHER): Payer: Self-pay

## 2023-10-04 DIAGNOSIS — M549 Dorsalgia, unspecified: Secondary | ICD-10-CM

## 2023-10-04 DIAGNOSIS — G9050 Complex regional pain syndrome I, unspecified: Secondary | ICD-10-CM

## 2023-10-04 MED ORDER — HYDROCODONE-ACETAMINOPHEN 7.5-325 MG PO TABS
1.0000 | ORAL_TABLET | Freq: Three times a day (TID) | ORAL | 0 refills | Status: DC | PRN
  Filled 2023-10-04: qty 90, 30d supply, fill #0

## 2023-10-04 NOTE — Telephone Encounter (Signed)
Requesting: hydrocodone 7.5-325mg   Contract: 11/09/22 UDS: Ordered 11/09/22 but not drawn/done Last Visit: 06/01/23 w/ Lillia Abed Next Visit: 12/20/23 Last Refill: 09/03/23 #90 and 0RF   Please Advise

## 2023-10-05 ENCOUNTER — Other Ambulatory Visit (HOSPITAL_BASED_OUTPATIENT_CLINIC_OR_DEPARTMENT_OTHER): Payer: Self-pay

## 2023-11-01 ENCOUNTER — Other Ambulatory Visit: Payer: Self-pay | Admitting: Family Medicine

## 2023-11-01 ENCOUNTER — Other Ambulatory Visit: Payer: Self-pay | Admitting: Family

## 2023-11-01 ENCOUNTER — Other Ambulatory Visit (HOSPITAL_BASED_OUTPATIENT_CLINIC_OR_DEPARTMENT_OTHER): Payer: Self-pay

## 2023-11-01 DIAGNOSIS — M549 Dorsalgia, unspecified: Secondary | ICD-10-CM

## 2023-11-01 DIAGNOSIS — G9050 Complex regional pain syndrome I, unspecified: Secondary | ICD-10-CM

## 2023-11-01 NOTE — Telephone Encounter (Signed)
Requesting: hydrocodone  Contract:  11/09/22 UDS: 12/19/21 Last Visit: 06/01/23 w/ Lillia Abed Next Visit: 12/20/23  Last Refill: 10/04/23 #90 and 0RF   Please Advise

## 2023-11-02 ENCOUNTER — Other Ambulatory Visit (HOSPITAL_BASED_OUTPATIENT_CLINIC_OR_DEPARTMENT_OTHER): Payer: Self-pay

## 2023-11-02 MED ORDER — HYDROCODONE-ACETAMINOPHEN 7.5-325 MG PO TABS
1.0000 | ORAL_TABLET | Freq: Three times a day (TID) | ORAL | 0 refills | Status: DC | PRN
  Filled 2023-11-02: qty 90, 30d supply, fill #0

## 2023-12-04 ENCOUNTER — Other Ambulatory Visit: Payer: Self-pay | Admitting: Family

## 2023-12-04 ENCOUNTER — Other Ambulatory Visit: Payer: Self-pay | Admitting: Family Medicine

## 2023-12-04 ENCOUNTER — Other Ambulatory Visit (HOSPITAL_BASED_OUTPATIENT_CLINIC_OR_DEPARTMENT_OTHER): Payer: Self-pay

## 2023-12-04 DIAGNOSIS — G9050 Complex regional pain syndrome I, unspecified: Secondary | ICD-10-CM

## 2023-12-04 DIAGNOSIS — M549 Dorsalgia, unspecified: Secondary | ICD-10-CM

## 2023-12-04 MED ORDER — HYDROCODONE-ACETAMINOPHEN 7.5-325 MG PO TABS
1.0000 | ORAL_TABLET | Freq: Three times a day (TID) | ORAL | 0 refills | Status: DC | PRN
  Filled 2023-12-04: qty 90, 30d supply, fill #0

## 2023-12-04 NOTE — Telephone Encounter (Signed)
Requesting: hydrocodone 7.5-325mg   Contract: 11/09/22 UDS: 12/19/21 Last Visit: 06/01/23 w/ Lillia Abed Next Visit: 12/20/23 Last Refill: 11/02/23 #90 and 0RF   Please Advise

## 2023-12-05 ENCOUNTER — Other Ambulatory Visit (HOSPITAL_BASED_OUTPATIENT_CLINIC_OR_DEPARTMENT_OTHER): Payer: Self-pay

## 2023-12-16 NOTE — Assessment & Plan Note (Signed)
 Encourage heart healthy diet such as MIND or DASH diet, increase exercise, avoid trans fats, simple carbohydrates and processed foods, consider a krill or fish or flaxseed oil cap daily.

## 2023-12-16 NOTE — Assessment & Plan Note (Signed)
 Hydrate and monitor

## 2023-12-16 NOTE — Assessment & Plan Note (Signed)
 hgba1c acceptable, minimize simple carbs. Increase exercise as tolerated.

## 2023-12-20 ENCOUNTER — Ambulatory Visit (INDEPENDENT_AMBULATORY_CARE_PROVIDER_SITE_OTHER): Payer: Medicare (Managed Care) | Admitting: Family Medicine

## 2023-12-20 ENCOUNTER — Other Ambulatory Visit (HOSPITAL_BASED_OUTPATIENT_CLINIC_OR_DEPARTMENT_OTHER): Payer: Self-pay

## 2023-12-20 VITALS — BP 120/76 | HR 88 | Temp 98.3°F | Resp 18 | Ht 67.0 in | Wt 251.4 lb

## 2023-12-20 DIAGNOSIS — E782 Mixed hyperlipidemia: Secondary | ICD-10-CM | POA: Diagnosis not present

## 2023-12-20 DIAGNOSIS — G9050 Complex regional pain syndrome I, unspecified: Secondary | ICD-10-CM

## 2023-12-20 DIAGNOSIS — M549 Dorsalgia, unspecified: Secondary | ICD-10-CM

## 2023-12-20 DIAGNOSIS — F418 Other specified anxiety disorders: Secondary | ICD-10-CM

## 2023-12-20 DIAGNOSIS — R739 Hyperglycemia, unspecified: Secondary | ICD-10-CM

## 2023-12-20 DIAGNOSIS — Z79899 Other long term (current) drug therapy: Secondary | ICD-10-CM

## 2023-12-20 DIAGNOSIS — G90522 Complex regional pain syndrome I of left lower limb: Secondary | ICD-10-CM

## 2023-12-20 DIAGNOSIS — K5900 Constipation, unspecified: Secondary | ICD-10-CM

## 2023-12-20 DIAGNOSIS — R252 Cramp and spasm: Secondary | ICD-10-CM

## 2023-12-20 DIAGNOSIS — R109 Unspecified abdominal pain: Secondary | ICD-10-CM

## 2023-12-20 MED ORDER — HYDROCODONE-ACETAMINOPHEN 7.5-325 MG PO TABS
1.0000 | ORAL_TABLET | Freq: Three times a day (TID) | ORAL | 0 refills | Status: DC | PRN
  Filled 2023-12-20 – 2024-01-02 (×3): qty 90, 30d supply, fill #0

## 2023-12-20 MED ORDER — CITALOPRAM HYDROBROMIDE 20 MG PO TABS
ORAL_TABLET | ORAL | 3 refills | Status: DC
  Filled 2023-12-20: qty 30, 30d supply, fill #0

## 2023-12-20 NOTE — Patient Instructions (Signed)
 Abdominal Pain, Adult  Pain in the abdomen (abdominal pain) can be caused by many things. In most cases, it gets better with no treatment or by being treated at home. But in some cases, it can be serious. Your health care provider will ask questions about your medical history and do a physical exam to try to figure out what is causing your pain. Follow these instructions at home: Medicines Take over-the-counter and prescription medicines only as told by your provider. Do not take medicines that help you poop (laxatives) unless told by your provider. General instructions Watch your condition for any changes. Drink enough fluid to keep your pee (urine) pale yellow. Contact a health care provider if: Your pain changes, gets worse, or lasts longer than expected. You have severe cramping or bloating in your abdomen, or you vomit. Your pain gets worse with meals, after eating, or with certain foods. You are constipated or have diarrhea for more than 2-3 days. You are not hungry, or you lose weight without trying. You have signs of dehydration. These may include: Dark pee, very little pee, or no pee. Cracked lips or dry mouth. Sleepiness or weakness. You have pain when you pee (urinate) or poop. Your abdominal pain wakes you up at night. You have blood in your pee. You have a fever. Get help right away if: You cannot stop vomiting. Your pain is only in one part of the abdomen. Pain on the right side could be caused by appendicitis. You have bloody or black poop (stool), or poop that looks like tar. You have trouble breathing. You have chest pain. These symptoms may be an emergency. Get help right away. Call 911. Do not wait to see if the symptoms will go away. Do not drive yourself to the hospital. This information is not intended to replace advice given to you by your health care provider. Make sure you discuss any questions you have with your health care provider. Document Revised:  07/19/2022 Document Reviewed: 07/19/2022 Elsevier Patient Education  2024 [employer].

## 2023-12-20 NOTE — Progress Notes (Signed)
 Subjective:    Patient ID: Beverly Erickson, female    DOB: [birth year], 53 y.o.   MRN: [id number]  Chief Complaint  Patient presents with  . Follow-up    HPI Discussed the use of AI scribe software for clinical note transcription with the patient, who gave verbal consent to proceed.  History of Present Illness Beverly Erickson is a 53 year old female who presents with recurrent abdominal pain and constipation.  She experiences recurrent abdominal pain and constipation following hernia surgery with mesh placement. The pain, described as either the same or slightly worse than before surgery, initially improved post-surgery but has since returned. Constipation, which had improved post-surgery, has gradually worsened over the past year, with increased straining and reduced stool volume. She has not changed her diet or fluid intake and has not undergone a colonoscopy, which was previously recommended due to her constipation. No blood or black stools are noted.  She experiences a sensation of 'everything in my body is electrified,' with her hair standing on end and a weird feeling across her stomach, sometimes leading to abdominal pain. This sensation is possibly related to stress, as she acknowledges significant stressors in her life, including issues with her children and unresolved feelings about her mother's situation. She previously took citalopram for stress management but discontinued it, acknowledging that she may need to restart it due to current stress levels.  She experiences symptoms of carpal tunnel syndrome in her right hand, including tingling, burning, and loss of feeling, which she attributes to a recurrence of previous issues during pregnancy. She also reports a stress knot in her neck with some pain radiating down her arm.    Past Medical History:  Diagnosis Date  . Anemia   . Chicken pox   . Depression   . Depression with anxiety 08/30/2015  . GERD (gastroesophageal reflux disease)    . Kidney stone 02/13/2014   Right, small  . Neck pain, musculoskeletal 11/17/2016  . Obesity   . Other and unspecified hyperlipidemia 01/14/2014  . Otitis media 11/17/2016  . Preventative health care 07/05/2014  . RSD (reflex sympathetic dystrophy)   . Tinea corporis 06/10/2017  . Tobacco use disorder 01/14/2014   1ppd  . Unspecified constipation 05/25/2014  . Urinary frequency 03/06/2017    Past Surgical History:  Procedure Laterality Date  . CESAREAN SECTION  2000  . LAPAROSCOPIC APPENDECTOMY N/A 05/22/2022   Procedure: APPENDECTOMY LAPAROSCOPIC;  Surgeon: Abigail Miyamoto, MD;  Location: [facility];  Service: General;  Laterality: N/A;  . plate and screws in left arm  2005   humerus  . WISDOM TOOTH EXTRACTION  53 yrs old    Family History  Problem Relation Age of Onset  . Arthritis Mother        Living  . Hypertension Mother   . Thrombocytopenia Mother   . Hyperlipidemia Father   . Hypertension Father   . Thrombocytopenia Maternal Grandmother   . Stroke Maternal Grandmother   . Hypertension Maternal Grandmother   . Alcohol abuse Maternal Grandfather   . Heart disease Maternal Grandfather   . Parkinson's disease Maternal Grandfather   . Alcohol abuse Paternal Grandfather   . Cancer Paternal Grandfather   . Hypothyroidism Daughter   . Diabetes Daughter        type 1  . Dementia Paternal Grandmother     Social History   Socioeconomic History  . Marital status: Married    Spouse name: Not on file  . Number of children: 3  .  Years of education: Not on file  . Highest education level: Not on file  Occupational History  . Occupation: Conservation officer, nature    Comment: Disability, but works at [employer] part time  Tobacco Use  . Smoking status: Every Day    Current packs/day: 1.00    Average packs/day: 1 pack/day for 20.0 years (20.0 ttl pk-yrs)    Types: Cigarettes  . Smokeless tobacco: Never  Substance and Sexual Activity  . Alcohol use: Not Currently    Comment: once a year.  .  Drug use: No  . Sexual activity: Not on file    Comment: lives with son, 58 yo daughter, boyfriend, no dietary  Other Topics Concern  . Not on file  Social History Narrative   Children live nearby   Social Drivers of Health   Financial Resource Strain: Medium Risk (07/09/2021)   Overall Financial Resource Strain (CARDIA)   . Difficulty of Paying Living Expenses: Somewhat hard  Food Insecurity: Food Insecurity Present (07/09/2021)   Hunger Vital Sign   . Worried About Programme researcher, broadcasting/film/video in the Last Year: Sometimes true   . Ran Out of Food in the Last Year: Never true  Transportation Needs: No Transportation Needs (07/09/2021)   PRAPARE - Transportation   . Lack of Transportation (Medical): No   . Lack of Transportation (Non-Medical): No  Physical Activity: Inactive (07/09/2021)   Exercise Vital Sign   . Days of Exercise per Week: 0 days   . Minutes of Exercise per Session: 0 min  Stress: Stress Concern Present (07/09/2021)   [employer] of Occupational Health - Occupational Stress Questionnaire   . Feeling of Stress : To some extent  Social Connections: Moderately Isolated (07/09/2021)   Social Connection and Isolation Panel [NHANES]   . Frequency of Communication with Friends and Family: More than three times a week   . Frequency of Social Gatherings with Friends and Family: More than three times a week   . Attends Religious Services: Never   . Active Member of Clubs or Organizations: No   . Attends Banker Meetings: Never   . Marital Status: Married  Catering manager Violence: Not At Risk (07/09/2021)   Humiliation, Afraid, Rape, and Kick questionnaire   . Fear of Current or Ex-Partner: No   . Emotionally Abused: No   . Physically Abused: No   . Sexually Abused: No    Outpatient Medications Prior to Visit  Medication Sig Dispense Refill  . albuterol (PROVENTIL HFA;VENTOLIN HFA) 108 (90 Base) MCG/ACT inhaler Inhale 2 puffs into the lungs every 6 (six) hours  as needed for wheezing or shortness of breath. 2 Inhaler 2  . albuterol (VENTOLIN HFA) 108 (90 Base) MCG/ACT inhaler Inhale 2 puffs into the lungs every 6 (six) hours as needed for wheezing or shortness of breath. 18 g 2  . budesonide-formoterol (SYMBICORT) 160-4.5 MCG/ACT inhaler Inhale 2 puffs into the lungs 2 (two) times daily. (Patient not taking: Reported on 12/20/2023) 10.2 g 3  . fluticasone (FLONASE) 50 MCG/ACT nasal spray Place 2 sprays into both nostrils daily. 16 g 0  . ibuprofen (ADVIL) 200 MG tablet Take 200-400 mg by mouth every 6 (six) hours as needed for mild pain or headache.    . meclizine (ANTIVERT) 25 MG tablet Take 1 tablet (25 mg total) by mouth 3 (three) times daily as needed for dizziness. 30 tablet 0  . nitroGLYCERIN (NITROSTAT) 0.4 MG SL tablet Place 1 tablet (0.4 mg total) under the tongue  every 5 (five) minutes as needed for chest pain. 25 tablet 1  . omeprazole (PRILOSEC) 40 MG capsule Take 1 capsule (40 mg total) by mouth 2 (two) times daily. 180 capsule 1  . pramipexole (MIRAPEX) 1 MG tablet Take 1 tablet (1 mg total) by mouth at bedtime. 30 tablet 2  . amoxicillin-clavulanate (AUGMENTIN) 875-125 MG tablet Take 1 tablet by mouth 2 (two) times daily. (Patient not taking: Reported on 12/20/2023) 14 tablet 0  . benzonatate (TESSALON) 100 MG capsule Take 1 capsule (100 mg total) by mouth 2 (two) times daily as needed for cough. (Patient not taking: Reported on 12/20/2023) 20 capsule 0  . citalopram (CELEXA) 20 MG tablet Take 1/2 tablet by mouth once daily for 1 week, then increase to a full tab once daily at bedtime 30 tablet 0  . HYDROcodone-acetaminophen (NORCO) 7.5-325 MG tablet Take 1 tablet by mouth 3 (three) times daily as needed for moderate pain (pain score 4-6). 90 tablet 0   No facility-administered medications prior to visit.    Allergies  Allergen Reactions  . Codeine Nausea Only  . Mucinex [Guaifenesin Er] Other (See Comments)    Nightmares    Review of  Systems  Constitutional:  Positive for malaise/fatigue. Negative for fever.  HENT:  Negative for congestion.   Eyes:  Negative for blurred vision.  Respiratory:  Negative for shortness of breath.   Cardiovascular:  Negative for chest pain, palpitations and leg swelling.  Gastrointestinal:  Positive for abdominal pain and constipation. Negative for blood in stool and nausea.  Genitourinary:  Negative for dysuria and frequency.  Musculoskeletal:  Positive for back pain, joint pain and myalgias. Negative for falls.  Skin:  Negative for rash.  Neurological:  Negative for dizziness, loss of consciousness and headaches.  Endo/Heme/Allergies:  Negative for environmental allergies.  Psychiatric/Behavioral:  Positive for depression. The patient is nervous/anxious.        Objective:    Physical Exam Constitutional:      General: She is not in acute distress.    Appearance: Normal appearance. She is well-developed. She is not toxic-appearing.  HENT:     Head: Normocephalic and atraumatic.     Right Ear: External ear normal.     Left Ear: External ear normal.     Nose: Nose normal.  Eyes:     General:        Right eye: No discharge.        Left eye: No discharge.     Conjunctiva/sclera: Conjunctivae normal.  Neck:     Thyroid: No thyromegaly.  Cardiovascular:     Rate and Rhythm: Normal rate and regular rhythm.     Heart sounds: Normal heart sounds. No murmur heard. Pulmonary:     Effort: Pulmonary effort is normal. No respiratory distress.     Breath sounds: Normal breath sounds.  Abdominal:     General: Bowel sounds are normal.     Palpations: Abdomen is soft.     Tenderness: There is no abdominal tenderness. There is no guarding.  Musculoskeletal:        General: Normal range of motion.     Cervical back: Neck supple.  Lymphadenopathy:     Cervical: No cervical adenopathy.  Skin:    General: Skin is warm and dry.  Neurological:     Mental Status: She is alert and oriented  to person, place, and time.  Psychiatric:        Mood and Affect: Mood normal.  Behavior: Behavior normal.        Thought Content: Thought content normal.        Judgment: Judgment normal.    BP 120/76 (BP Location: Right Arm, Patient Position: Sitting, Cuff Size: Large)   Pulse 88   Temp 98.3 F (36.8 C) (Oral)   Resp 18   Ht 5\' 7"  (1.702 m)   Wt 251 lb 6.4 oz (114 kg)   SpO2 96%   BMI 39.37 kg/m  Wt Readings from Last 3 Encounters:  12/20/23 251 lb 6.4 oz (114 kg)  06/01/23 239 lb 12.8 oz (108.8 kg)  12/26/22 241 lb 6.4 oz (109.5 kg)    Diabetic Foot Exam - Simple   No data filed    Lab Results  Component Value Date   WBC 7.0 12/26/2022   HGB 14.0 12/26/2022   HCT 41.9 12/26/2022   PLT 279.0 12/26/2022   GLUCOSE 87 12/26/2022   CHOL 144 12/26/2022   TRIG 67.0 12/26/2022   HDL 37.50 (L) 12/26/2022   LDLCALC 93 12/26/2022   ALT 21 12/26/2022   AST 17 12/26/2022   NA 140 12/26/2022   K 4.1 12/26/2022   CL 109 12/26/2022   CREATININE 0.64 12/26/2022   BUN 15 12/26/2022   CO2 25 12/26/2022   TSH 1.58 12/26/2022   HGBA1C 5.5 12/26/2022    Lab Results  Component Value Date   TSH 1.58 12/26/2022   Lab Results  Component Value Date   WBC 7.0 12/26/2022   HGB 14.0 12/26/2022   HCT 41.9 12/26/2022   MCV 92.4 12/26/2022   PLT 279.0 12/26/2022   Lab Results  Component Value Date   NA 140 12/26/2022   K 4.1 12/26/2022   CO2 25 12/26/2022   GLUCOSE 87 12/26/2022   BUN 15 12/26/2022   CREATININE 0.64 12/26/2022   BILITOT 0.3 12/26/2022   ALKPHOS 57 12/26/2022   AST 17 12/26/2022   ALT 21 12/26/2022   PROT 6.3 12/26/2022   ALBUMIN 3.8 12/26/2022   CALCIUM 9.0 12/26/2022   ANIONGAP 6 05/28/2022   GFR 102.77 12/26/2022   Lab Results  Component Value Date   CHOL 144 12/26/2022   Lab Results  Component Value Date   HDL 37.50 (L) 12/26/2022   Lab Results  Component Value Date   LDLCALC 93 12/26/2022   Lab Results  Component Value Date    TRIG 67.0 12/26/2022   Lab Results  Component Value Date   CHOLHDL 4 12/26/2022   Lab Results  Component Value Date   HGBA1C 5.5 12/26/2022       Assessment & Plan:  Hyperglycemia Assessment & Plan: hgba1c acceptable, minimize simple carbs. Increase exercise as tolerated.   Orders: -     Comprehensive metabolic panel; Future -     Hemoglobin A1c; Future -     TSH; Future  Hyperlipidemia, mixed Assessment & Plan: Encourage heart healthy diet such as MIND or DASH diet, increase exercise, avoid trans fats, simple carbohydrates and processed foods, consider a krill or fish or flaxseed oil cap daily.   Orders: -     Lipid panel; Future -     TSH; Future  Muscle cramp Assessment & Plan: Hydrate and monitor    Depression with anxiety -     Citalopram Hydrobromide; Take one-half tablet (10mg ) by mouth once daily for 1 week, then increase to 1 tablet (20mg ) once daily at bedtime.  Dispense: 30 tablet; Refill: 3  RSD (reflex sympathetic dystrophy) -  HYDROcodone-Acetaminophen; Take 1 tablet by mouth 3 (three) times daily as needed for moderate pain (pain score 4-6).  Dispense: 90 tablet; Refill: 0 -     CBC with Differential/Platelet; Future  Mid back pain on right side -     HYDROcodone-Acetaminophen; Take 1 tablet by mouth 3 (three) times daily as needed for moderate pain (pain score 4-6).  Dispense: 90 tablet; Refill: 0 -     CBC with Differential/Platelet; Future  Constipation, unspecified constipation type -     Ambulatory referral to Gastroenterology  Abdominal pain, unspecified abdominal location -     Ambulatory referral to Gastroenterology -     DG Abd 1 View; Future  Complex regional pain syndrome type 1 of left lower extremity -     Drug Monitoring Panel [id number] , Urine; Future  High risk medication use -     Drug Monitoring Panel [id number] , Urine; Future    Assessment and Plan Assessment & Plan Abdominal Pain and Constipation Recurrent symptoms  post-hernia surgery suggest possible mesh displacement, bowel obstruction, or polyps. Colonoscopy recommended due to increased colon cancer risk. - Refer to gastroenterology for evaluation and colonoscopy. - Order abdominal x-ray to assess for mesh displacement. - Advise increased water intake, fiber supplementation, and probiotic use. - Encourage physical activity to aid bowel movements.  Stress and Anxiety Symptoms consistent with sympathetic nervous system activation. Citalopram may blunt adrenaline response. - Restart citalopram with initial dosing of half a tablet daily for one week, then increase to a full tablet. - Schedule a virtual follow-up in 10-12 weeks to assess medication efficacy. - Discuss potential referral to counseling or psychiatry for additional support.  Carpal Tunnel Syndrome Recurrent symptoms suggest nerve compression. Conservative management has shown improvement. - Recommend icing the wrist for 15 minutes three times daily. - Advise use of over-the-counter 4% lidocaine patches cut into quarters over the carpal tunnel area. - Provide a wrist splint for immobilization. - Consider referral to a hand specialist if symptoms do not improve.     Danise Edge, MD

## 2023-12-26 ENCOUNTER — Other Ambulatory Visit: Payer: Medicare (Managed Care)

## 2023-12-31 ENCOUNTER — Other Ambulatory Visit (HOSPITAL_BASED_OUTPATIENT_CLINIC_OR_DEPARTMENT_OTHER): Payer: Self-pay

## 2024-01-01 ENCOUNTER — Other Ambulatory Visit (HOSPITAL_BASED_OUTPATIENT_CLINIC_OR_DEPARTMENT_OTHER): Payer: Self-pay

## 2024-01-02 ENCOUNTER — Other Ambulatory Visit (HOSPITAL_BASED_OUTPATIENT_CLINIC_OR_DEPARTMENT_OTHER): Payer: Self-pay

## 2024-01-31 ENCOUNTER — Other Ambulatory Visit (HOSPITAL_BASED_OUTPATIENT_CLINIC_OR_DEPARTMENT_OTHER): Payer: Self-pay

## 2024-01-31 ENCOUNTER — Other Ambulatory Visit: Payer: Self-pay | Admitting: Family Medicine

## 2024-01-31 DIAGNOSIS — G9050 Complex regional pain syndrome I, unspecified: Secondary | ICD-10-CM

## 2024-01-31 DIAGNOSIS — M549 Dorsalgia, unspecified: Secondary | ICD-10-CM

## 2024-01-31 MED ORDER — HYDROCODONE-ACETAMINOPHEN 7.5-325 MG PO TABS
1.0000 | ORAL_TABLET | Freq: Three times a day (TID) | ORAL | 0 refills | Status: DC | PRN
  Filled 2024-01-31: qty 90, 30d supply, fill #0

## 2024-01-31 NOTE — Telephone Encounter (Signed)
 Requesting: Norco 7.5-325 Contract: 12/20/2023 UDS: 12/20/2023 Last Visit: 12/20/2023 Next Visit: 07/07/2024 Last Refill: 12/20/2023  Please Advise

## 2024-02-25 ENCOUNTER — Encounter: Payer: Self-pay | Admitting: Family Medicine

## 2024-02-28 ENCOUNTER — Other Ambulatory Visit: Payer: Self-pay | Admitting: Family

## 2024-02-28 DIAGNOSIS — G9050 Complex regional pain syndrome I, unspecified: Secondary | ICD-10-CM

## 2024-02-28 DIAGNOSIS — M549 Dorsalgia, unspecified: Secondary | ICD-10-CM

## 2024-02-29 ENCOUNTER — Other Ambulatory Visit (HOSPITAL_BASED_OUTPATIENT_CLINIC_OR_DEPARTMENT_OTHER): Payer: Self-pay

## 2024-02-29 ENCOUNTER — Other Ambulatory Visit: Payer: Self-pay | Admitting: Family Medicine

## 2024-02-29 DIAGNOSIS — M549 Dorsalgia, unspecified: Secondary | ICD-10-CM

## 2024-02-29 DIAGNOSIS — G9050 Complex regional pain syndrome I, unspecified: Secondary | ICD-10-CM

## 2024-02-29 MED ORDER — HYDROCODONE-ACETAMINOPHEN 7.5-325 MG PO TABS
1.0000 | ORAL_TABLET | Freq: Three times a day (TID) | ORAL | 0 refills | Status: DC | PRN
  Filled 2024-02-29: qty 90, 30d supply, fill #0

## 2024-02-29 NOTE — Telephone Encounter (Signed)
 Requesting: hydrocodone  apap [phone] Contract: Yes UDS: 12/20/2023 Last Visit: 12/20/2023 Next Visit: 07/07/2024 Last Refill: 01/31/2024   Please Advise

## 2024-03-12 ENCOUNTER — Telehealth: Payer: Self-pay | Admitting: Family Medicine

## 2024-03-12 NOTE — Telephone Encounter (Signed)
 Copied from CRM [id number]. Topic: Medicare AWV >> Mar 12, 2024 11:34 AM Juliana Ocean wrote: Reason for CRM: Called 03/12/2024 to sched AWV - NO VOICEMAIL  Rosalee Collins; Care Guide Ambulatory Clinical Support [facility] Direct Dial: [phone]

## 2024-03-18 ENCOUNTER — Telehealth: Payer: Medicare (Managed Care) | Admitting: Emergency Medicine

## 2024-03-18 DIAGNOSIS — K529 Noninfective gastroenteritis and colitis, unspecified: Secondary | ICD-10-CM

## 2024-03-18 MED ORDER — ONDANSETRON HCL 4 MG PO TABS: 4.0000 mg | ORAL_TABLET | Freq: Three times a day (TID) | ORAL | 0 refills | Status: DC | PRN

## 2024-03-18 NOTE — Progress Notes (Signed)
 Virtual Visit Consent   Beverly Erickson, you are scheduled for a virtual visit with a [name] today. Just as with appointments in the office, your consent must be obtained to participate. Your consent will be active for this visit and any virtual visit you may have with one of our providers in the next 365 days. If you have a MyChart account, a copy of this consent can be sent to you electronically.  As this is a virtual visit, video technology does not allow for your provider to perform a traditional examination. This may limit your provider's ability to fully assess your condition. If your provider identifies any concerns that need to be evaluated in person or the need to arrange testing (such as labs, EKG, etc.), we will make arrangements to do so. Although advances in technology are sophisticated, we cannot ensure that it will always work on either your end or our end. If the connection with a video visit is poor, the visit may have to be switched to a telephone visit. With either a video or telephone visit, we are not always able to ensure that we have a secure connection.  By engaging in this virtual visit, you consent to the provision of healthcare and authorize for your insurance to be billed (if applicable) for the services provided during this visit. Depending on your insurance coverage, you may receive a charge related to this service.  I need to obtain your verbal consent now. Are you willing to proceed with your visit today? [name] has provided verbal consent on 03/18/2024 for a virtual visit (video or telephone). [name], NP  Date: 03/18/2024 1:25 PM   Virtual Visit via Video Note   I, [name], connected with  Beverly Erickson  ([id number], 04/13/1971) on 03/18/24 at  1:30 PM EDT by a video-enabled telemedicine application and verified that I am speaking with the correct person using two identifiers.  Location: Patient: Virtual Visit Location Patient:  Home Provider: Virtual Visit Location Provider: Home Office   I discussed the limitations of evaluation and management by telemedicine and the availability of in person appointments. The patient expressed understanding and agreed to proceed.    History of Present Illness: [name] is a 53 y.o. who identifies as a female who was assigned female at birth, and is being seen today for   Grandkids with stomach bug a week ago, her 52yo had it this weekend, now she has it. Started yesterday with vomiting ~ 10 times (last vomited this morning 9am), diarrhea 4-5 x (last yesterday afternoon), abd pain in epigastric area, headache.   Drinking water, ginger ale, saltines, keeping it down.   Dizzy but urine is not dark.   Breathing ok, not needing albuterol .   HPI: HPI  Problems:  Patient Active Problem List   Diagnosis Date Noted   Vertigo 12/26/2022   Generalized abdominal pain 05/18/2022   RUQ pain 12/19/2021   High risk medication use 12/19/2021   Hot flashes 12/19/2021   Contact dermatitis 07/29/2021   Bruise 03/07/2021   Left hip pain 09/14/2020   Allergies 07/14/2020   Fatigue 01/22/2018   Tinea corporis 06/10/2017   Muscle cramp 03/07/2017   Low back pain 03/07/2017   Urinary frequency 03/06/2017   Neck pain, musculoskeletal 11/17/2016   Otitis media 11/17/2016   Breast cancer screening 08/13/2016   Abdominal pain, chronic, epigastric 08/13/2016   Witnessed episode of apnea 08/13/2016   Acute bacterial sinusitis 10/20/2015   Depression  with anxiety 08/30/2015   Elevated blood pressure 08/11/2015   Atypical chest pain 05/23/2015   Preventative health care 07/05/2014   Constipation 05/25/2014   Lesion of tonsil 05/03/2014   Transient ischemic attack (TIA) 05/03/2014   Kidney stone 02/13/2014   Mid back pain on right side 02/13/2014   Hyperglycemia 01/14/2014   Hyperlipidemia, mixed 01/14/2014   Obesity    RSD (reflex sympathetic dystrophy)    GERD (gastroesophageal  reflux disease) 12/05/2013   Complex regional pain syndrome I of lower limb 02/07/2013    Allergies:  Allergies  Allergen Reactions   Codeine Nausea Only   Mucinex [Guaifenesin Er] Other (See Comments)    Nightmares   Medications:  Current Outpatient Medications:    ondansetron  (ZOFRAN ) 4 MG tablet, Take 1 tablet (4 mg total) by mouth every 8 (eight) hours as needed for nausea or vomiting., Disp: 20 tablet, Rfl: 0   albuterol  (PROVENTIL  HFA;VENTOLIN  HFA) 108 (90 Base) MCG/ACT inhaler, Inhale 2 puffs into the lungs every 6 (six) hours as needed for wheezing or shortness of breath., Disp: 2 Inhaler, Rfl: 2   albuterol  (VENTOLIN  HFA) 108 (90 Base) MCG/ACT inhaler, Inhale 2 puffs into the lungs every 6 (six) hours as needed for wheezing or shortness of breath., Disp: 18 g, Rfl: 2   budesonide -formoterol  (SYMBICORT ) 160-4.5 MCG/ACT inhaler, Inhale 2 puffs into the lungs 2 (two) times daily. (Patient not taking: Reported on 12/20/2023), Disp: 10.2 g, Rfl: 3   citalopram  (CELEXA ) 20 MG tablet, Take one-half tablet (10mg ) by mouth once daily for 1 week, then increase to 1 tablet (20mg ) once daily at bedtime., Disp: 30 tablet, Rfl: 3   fluticasone  (FLONASE ) 50 MCG/ACT nasal spray, Place 2 sprays into both nostrils daily., Disp: 16 g, Rfl: 0   HYDROcodone -acetaminophen  (NORCO) 7.5-325 MG tablet, Take 1 tablet by mouth 3 (three) times daily as needed for moderate pain (pain score 4-6)., Disp: 90 tablet, Rfl: 0   ibuprofen (ADVIL) 200 MG tablet, Take 200-400 mg by mouth every 6 (six) hours as needed for mild pain or headache., Disp: , Rfl:    meclizine  (ANTIVERT ) 25 MG tablet, Take 1 tablet (25 mg total) by mouth 3 (three) times daily as needed for dizziness., Disp: 30 tablet, Rfl: 0   nitroGLYCERIN  (NITROSTAT ) 0.4 MG SL tablet, Place 1 tablet (0.4 mg total) under the tongue every 5 (five) minutes as needed for chest pain., Disp: 25 tablet, Rfl: 1   omeprazole  (PRILOSEC) 40 MG capsule, Take 1 capsule (40 mg  total) by mouth 2 (two) times daily., Disp: 180 capsule, Rfl: 1   pramipexole  (MIRAPEX ) 1 MG tablet, Take 1 tablet (1 mg total) by mouth at bedtime., Disp: 30 tablet, Rfl: 2  Observations/Objective: Patient is well-developed, well-nourished in no acute distress.  Resting comfortably  at home.  Head is normocephalic, atraumatic.  No labored breathing.  Speech is clear and coherent with logical content.  Patient is alert and oriented at baseline.    Assessment and Plan: 1. Gastroenteritis (Primary)  Reviewed supportive care measures. Given note for work. Recommend Imodium for diarrhea.   Follow Up Instructions: I discussed the assessment and treatment plan with the patient. The patient was provided an opportunity to ask questions and all were answered. The patient agreed with the plan and demonstrated an understanding of the instructions.  A copy of instructions were sent to the patient via MyChart unless otherwise noted below.   The patient was advised to call back or seek an in-person evaluation  if the symptoms worsen or if the condition fails to improve as anticipated.    [name], NP

## 2024-03-18 NOTE — Patient Instructions (Addendum)
 [employer], thank you for joining [name], NP for today's virtual visit.  While this provider is not your primary care provider (PCP), if your PCP is located in our provider database this encounter information will be shared with them immediately following your visit.   A [facility] MyChart account gives you access to today's visit and all your visits, tests, and labs performed at [facility] " click here if you don't have a [facility] MyChart account or go to [email or web address]  Consent: (Patient) Anjanette Gilkey provided verbal consent for this virtual visit at the beginning of the encounter.  Current Medications:  Current Outpatient Medications:    ondansetron  (ZOFRAN ) 4 MG tablet, Take 1 tablet (4 mg total) by mouth every 8 (eight) hours as needed for nausea or vomiting., Disp: 20 tablet, Rfl: 0   albuterol  (PROVENTIL  HFA;VENTOLIN  HFA) 108 (90 Base) MCG/ACT inhaler, Inhale 2 puffs into the lungs every 6 (six) hours as needed for wheezing or shortness of breath., Disp: 2 Inhaler, Rfl: 2   albuterol  (VENTOLIN  HFA) 108 (90 Base) MCG/ACT inhaler, Inhale 2 puffs into the lungs every 6 (six) hours as needed for wheezing or shortness of breath., Disp: 18 g, Rfl: 2   budesonide -formoterol  (SYMBICORT ) 160-4.5 MCG/ACT inhaler, Inhale 2 puffs into the lungs 2 (two) times daily. (Patient not taking: Reported on 12/20/2023), Disp: 10.2 g, Rfl: 3   citalopram  (CELEXA ) 20 MG tablet, Take one-half tablet (10mg ) by mouth once daily for 1 week, then increase to 1 tablet (20mg ) once daily at bedtime., Disp: 30 tablet, Rfl: 3   fluticasone  (FLONASE ) 50 MCG/ACT nasal spray, Place 2 sprays into both nostrils daily., Disp: 16 g, Rfl: 0   HYDROcodone -acetaminophen  (NORCO) 7.5-325 MG tablet, Take 1 tablet by mouth 3 (three) times daily as needed for moderate pain (pain score 4-6)., Disp: 90 tablet, Rfl: 0   ibuprofen (ADVIL) 200 MG tablet, Take 200-400 mg by mouth every 6 (six) hours as needed  for mild pain or headache., Disp: , Rfl:    meclizine  (ANTIVERT ) 25 MG tablet, Take 1 tablet (25 mg total) by mouth 3 (three) times daily as needed for dizziness., Disp: 30 tablet, Rfl: 0   nitroGLYCERIN  (NITROSTAT ) 0.4 MG SL tablet, Place 1 tablet (0.4 mg total) under the tongue every 5 (five) minutes as needed for chest pain., Disp: 25 tablet, Rfl: 1   omeprazole  (PRILOSEC) 40 MG capsule, Take 1 capsule (40 mg total) by mouth 2 (two) times daily., Disp: 180 capsule, Rfl: 1   pramipexole  (MIRAPEX ) 1 MG tablet, Take 1 tablet (1 mg total) by mouth at bedtime., Disp: 30 tablet, Rfl: 2   Medications ordered in this encounter:  Meds ordered this encounter  Medications   ondansetron  (ZOFRAN ) 4 MG tablet    Sig: Take 1 tablet (4 mg total) by mouth every 8 (eight) hours as needed for nausea or vomiting.    Dispense:  20 tablet    Refill:  0     *If you need refills on other medications prior to your next appointment, please contact your pharmacy*  Follow-Up: Call back or seek an in-person evaluation if the symptoms worsen or if the condition fails to improve as anticipated.  [facility] [phone]  Other Instructions Tylenol  for pain if tolerating solids.   It's important to stay hydrated. Ginger ale and water are ok. When you feel up to trying solids, saltines are a good place to start. Try other simple, bland foods until you  are feeling much better.   Purchase Imodium over the counter to treat diarrhea. Take it per package instructions.   I have prescribed a nausea medicine for you to use at home.   If you cannot keep liquids down with the nausea medicine and are worried you are becoming more dehydrated, please be seen in person, such as at an Urgent Care or an ER if after hours.    If you have been instructed to have an in-person evaluation today at a local Urgent Care facility, please use the link below. It will take you to a list of all of our available [facility]  [facility]gent [facility], including address, phone number and hours of operation. Please do not delay care.  [facility]  If you or a family member do not have a primary care provider, use the link below to schedule a visit and establish care. When you choose a [facility] primary care physician or advanced practice provider, you gain a long-term partner in health. Find a Primary Care Provider  Learn more about [facility]'s in-office and virtual care options: [facility] - Get Care Now

## 2024-03-31 ENCOUNTER — Other Ambulatory Visit: Payer: Self-pay | Admitting: Family

## 2024-03-31 DIAGNOSIS — M549 Dorsalgia, unspecified: Secondary | ICD-10-CM

## 2024-03-31 DIAGNOSIS — G9050 Complex regional pain syndrome I, unspecified: Secondary | ICD-10-CM

## 2024-04-01 ENCOUNTER — Other Ambulatory Visit: Payer: Self-pay | Admitting: Family Medicine

## 2024-04-01 ENCOUNTER — Other Ambulatory Visit (HOSPITAL_BASED_OUTPATIENT_CLINIC_OR_DEPARTMENT_OTHER): Payer: Self-pay

## 2024-04-01 DIAGNOSIS — M549 Dorsalgia, unspecified: Secondary | ICD-10-CM

## 2024-04-01 DIAGNOSIS — G9050 Complex regional pain syndrome I, unspecified: Secondary | ICD-10-CM

## 2024-04-01 MED ORDER — HYDROCODONE-ACETAMINOPHEN 7.5-325 MG PO TABS
1.0000 | ORAL_TABLET | Freq: Three times a day (TID) | ORAL | 0 refills | Status: DC | PRN
  Filled 2024-04-01: qty 30, 10d supply, fill #0

## 2024-04-01 NOTE — Telephone Encounter (Signed)
 Requesting: Norco 7.5 - 325 MG Contract: 01/10/24 UDS: 12/19/2021 Last Visit: 12/20/2023 Next Visit: 07/07/2024 Last Refill: 02/29/24  Please Advise   Patient needs to complete lab work.

## 2024-04-11 ENCOUNTER — Other Ambulatory Visit: Payer: Self-pay | Admitting: Family Medicine

## 2024-04-11 ENCOUNTER — Other Ambulatory Visit (HOSPITAL_BASED_OUTPATIENT_CLINIC_OR_DEPARTMENT_OTHER): Payer: Self-pay

## 2024-04-11 DIAGNOSIS — G9050 Complex regional pain syndrome I, unspecified: Secondary | ICD-10-CM

## 2024-04-11 DIAGNOSIS — M549 Dorsalgia, unspecified: Secondary | ICD-10-CM

## 2024-04-11 MED ORDER — HYDROCODONE-ACETAMINOPHEN 7.5-325 MG PO TABS
1.0000 | ORAL_TABLET | Freq: Three times a day (TID) | ORAL | 0 refills | Status: DC | PRN
  Filled 2024-04-11: qty 90, 30d supply, fill #0

## 2024-04-11 NOTE — Telephone Encounter (Signed)
 Prescription signed on 04/01/24

## 2024-05-08 ENCOUNTER — Other Ambulatory Visit: Payer: Self-pay | Admitting: Family Medicine

## 2024-05-08 DIAGNOSIS — M549 Dorsalgia, unspecified: Secondary | ICD-10-CM

## 2024-05-08 DIAGNOSIS — G9050 Complex regional pain syndrome I, unspecified: Secondary | ICD-10-CM

## 2024-05-09 ENCOUNTER — Other Ambulatory Visit (HOSPITAL_BASED_OUTPATIENT_CLINIC_OR_DEPARTMENT_OTHER): Payer: Self-pay

## 2024-05-09 MED ORDER — HYDROCODONE-ACETAMINOPHEN 7.5-325 MG PO TABS
1.0000 | ORAL_TABLET | Freq: Three times a day (TID) | ORAL | 0 refills | Status: DC | PRN
  Filled 2024-05-09: qty 90, 30d supply, fill #0

## 2024-05-09 NOTE — Telephone Encounter (Signed)
 Requesting: Norco 7.5 - 325 MG Contract: 12/20/2023 UDS: 12/19/2021 Last Visit: 12/20/2023 Next Visit: 07/07/2024 Last Refill: 04/11/24  Please Advise

## 2024-05-16 ENCOUNTER — Telehealth: Payer: Medicare (Managed Care) | Admitting: Physician Assistant

## 2024-05-16 ENCOUNTER — Ambulatory Visit: Payer: Self-pay

## 2024-05-16 DIAGNOSIS — S0096XA Insect bite (nonvenomous) of unspecified part of head, initial encounter: Secondary | ICD-10-CM | POA: Diagnosis not present

## 2024-05-16 DIAGNOSIS — W57XXXA Bitten or stung by nonvenomous insect and other nonvenomous arthropods, initial encounter: Secondary | ICD-10-CM | POA: Diagnosis not present

## 2024-05-16 MED ORDER — DOXYCYCLINE HYCLATE 100 MG PO TABS: 100.0000 mg | ORAL_TABLET | Freq: Two times a day (BID) | ORAL | 0 refills | Status: AC

## 2024-05-16 NOTE — Telephone Encounter (Signed)
 FYI Only or Action Required?: FYI only for provider.  Patient was last seen in primary care on 03/18/2024 by Richad Jon HERO, NP.  Called Nurse Triage reporting Tick Removal.  Symptoms began a week ago.  Interventions attempted: Nothing.  Symptoms are: gradually worsening.  Triage Disposition: See Physician Within 24 Hours  Patient/caregiver understands and will follow disposition?: Yes  Was seen virtually today for this bite and was told to f/u with pcp.  Copied from CRM [id number]. Topic: Clinical - Red Word Triage >> May 16, 2024 11:41 AM Deaijah H wrote: Red Word that prompted transfer to Nurse Triage: Tick bite / Lyme disease Reason for Disposition  [1] 2 to 14 days following tick bite AND [2] fever AND [3] no rash or headache  Answer Assessment - Initial Assessment Questions 1. ATTACHED:  Is the tick still on the skin?  (e.g., yes, no, unsure)     no 2. ONSET - TICK STILL ATTACHED:  How long do you think the tick has been on your skin? (e.g., hours, days, unsure)  Note:  Is there a recent activity (camping, hiking) where the caller may have been exposed?     removed 3. ONSET - TICK NOT STILL ATTACHED: If the tick has been removed, how long do you think the tick was attached before you removed it? (e.g., 5 hours, 2 days). When was this?     End of last week 4. LOCATION: Where is the tick bite located? (e.g., arm, leg)     On temple to right side of head 5. TYPE of TICK: Is it a wood tick or a deer tick? (e.g., deer tick, wood tick; unsure)     Had one white spot on the back, lonestar tick 6. SIZE of TICK: How big is the tick? (e.g., size of poppy seed, apple seed, watermelon seed; unsure) Note: Deer ticks can be the size of a poppy seed (nymph) or an apple seed (adult).       Medium sized 7. ENGORGED: Did the tick look flat or engorged (full, swollen)? (e.g., flat, engorged; unsure)     no 8. OTHER SYMPTOMS: Do you have any other symptoms? (e.g., fever, rash,  redness at bite area, red ring around bite)     denies 9. PREGNANCY: Is there any chance you are pregnant? When was your last menstrual period?     na  Protocols used: Tick Bite-A-AH

## 2024-05-16 NOTE — Progress Notes (Signed)
 Virtual Visit Consent   Beverly Erickson, you are scheduled for a virtual visit with a [facility] provider today. Just as with appointments in the office, your consent must be obtained to participate. Your consent will be active for this visit and any virtual visit you may have with one of our providers in the next 365 days. If you have a MyChart account, a copy of this consent can be sent to you electronically.  As this is a virtual visit, video technology does not allow for your provider to perform a traditional examination. This may limit your provider's ability to fully assess your condition. If your provider identifies any concerns that need to be evaluated in person or the need to arrange testing (such as labs, EKG, etc.), we will make arrangements to do so. Although advances in technology are sophisticated, we cannot ensure that it will always work on either your end or our end. If the connection with a video visit is poor, the visit may have to be switched to a telephone visit. With either a video or telephone visit, we are not always able to ensure that we have a secure connection.  By engaging in this virtual visit, you consent to the provision of healthcare and authorize for your insurance to be billed (if applicable) for the services provided during this visit. Depending on your insurance coverage, you may receive a charge related to this service.  I need to obtain your verbal consent now. Are you willing to proceed with your visit today? Beverly Erickson has provided verbal consent on 05/16/2024 for a virtual visit (video or telephone). Beverly Erickson, [place]  Date: 05/16/2024 11:36 AM   Virtual Visit via Video Note   I, Beverly Erickson, connected with  Beverly Erickson  ([id number], 1970/11/11) on 05/16/24 at 11:30 AM EDT by a video-enabled telemedicine application and verified that I am speaking with the correct person using two identifiers.  Location: Patient: Virtual Visit Location Patient:  Home Provider: Virtual Visit Location Provider: Home Office   I discussed the limitations of evaluation and management by telemedicine and the availability of in person appointments. The patient expressed understanding and agreed to proceed.    History of Present Illness: Beverly Erickson is a 53 y.o. who identifies as a female who was assigned female at birth, and is being seen today for tick bite.  HPI: Trauma The incident occurred 3 to 5 days ago. The incident occurred at home. Injury mechanism: tick bite. No protective equipment was used. She is experiencing no pain. Associated symptoms include headaches, nausea and weakness. There have been no prior injuries to these areas. Her tetanus status is unknown.    Problems:  Patient Active Problem List   Diagnosis Date Noted   Vertigo 12/26/2022   Generalized abdominal pain 05/18/2022   RUQ pain 12/19/2021   High risk medication use 12/19/2021   Hot flashes 12/19/2021   Contact dermatitis 07/29/2021   Bruise 03/07/2021   Left hip pain 09/14/2020   Allergies 07/14/2020   Fatigue 01/22/2018   Tinea corporis 06/10/2017   Muscle cramp 03/07/2017   Low back pain 03/07/2017   Urinary frequency 03/06/2017   Neck pain, musculoskeletal 11/17/2016   Otitis media 11/17/2016   Breast cancer screening 08/13/2016   Abdominal pain, chronic, epigastric 08/13/2016   Witnessed episode of apnea 08/13/2016   Acute bacterial sinusitis 10/20/2015   Depression with anxiety 08/30/2015   Elevated blood pressure 08/11/2015   Atypical chest pain 05/23/2015   Preventative health care  07/05/2014   Constipation 05/25/2014   Lesion of tonsil 05/03/2014   Transient ischemic attack (TIA) 05/03/2014   Kidney stone 02/13/2014   Mid back pain on right side 02/13/2014   Hyperglycemia 01/14/2014   Hyperlipidemia, mixed 01/14/2014   Obesity    RSD (reflex sympathetic dystrophy)    GERD (gastroesophageal reflux disease) 12/05/2013   Complex regional pain syndrome I  of lower limb 02/07/2013    Allergies:  Allergies  Allergen Reactions   Codeine Nausea Only   Mucinex [Guaifenesin Er] Other (See Comments)    Nightmares   Medications:  Current Outpatient Medications:    albuterol  (PROVENTIL  HFA;VENTOLIN  HFA) 108 (90 Base) MCG/ACT inhaler, Inhale 2 puffs into the lungs every 6 (six) hours as needed for wheezing or shortness of breath., Disp: 2 Inhaler, Rfl: 2   albuterol  (VENTOLIN  HFA) 108 (90 Base) MCG/ACT inhaler, Inhale 2 puffs into the lungs every 6 (six) hours as needed for wheezing or shortness of breath., Disp: 18 g, Rfl: 2   budesonide -formoterol  (SYMBICORT ) 160-4.5 MCG/ACT inhaler, Inhale 2 puffs into the lungs 2 (two) times daily. (Patient not taking: Reported on 12/20/2023), Disp: 10.2 g, Rfl: 3   citalopram  (CELEXA ) 20 MG tablet, Take one-half tablet (10mg ) by mouth once daily for 1 week, then increase to 1 tablet (20mg ) once daily at bedtime., Disp: 30 tablet, Rfl: 3   fluticasone  (FLONASE ) 50 MCG/ACT nasal spray, Place 2 sprays into both nostrils daily., Disp: 16 g, Rfl: 0   HYDROcodone -acetaminophen  (NORCO) 7.5-325 MG tablet, Take 1 tablet by mouth 3 (three) times daily as needed for moderate pain (pain score 4-6)., Disp: 90 tablet, Rfl: 0   ibuprofen (ADVIL) 200 MG tablet, Take 200-400 mg by mouth every 6 (six) hours as needed for mild pain or headache., Disp: , Rfl:    meclizine  (ANTIVERT ) 25 MG tablet, Take 1 tablet (25 mg total) by mouth 3 (three) times daily as needed for dizziness., Disp: 30 tablet, Rfl: 0   nitroGLYCERIN  (NITROSTAT ) 0.4 MG SL tablet, Place 1 tablet (0.4 mg total) under the tongue every 5 (five) minutes as needed for chest pain., Disp: 25 tablet, Rfl: 1   omeprazole  (PRILOSEC) 40 MG capsule, Take 1 capsule (40 mg total) by mouth 2 (two) times daily., Disp: 180 capsule, Rfl: 1   ondansetron  (ZOFRAN ) 4 MG tablet, Take 1 tablet (4 mg total) by mouth every 8 (eight) hours as needed for nausea or vomiting., Disp: 20 tablet, Rfl:  0   pramipexole  (MIRAPEX ) 1 MG tablet, Take 1 tablet (1 mg total) by mouth at bedtime., Disp: 30 tablet, Rfl: 2  Observations/Objective: Patient is well-developed, well-nourished in no acute distress.  Resting comfortably  at home.  Head is normocephalic, atraumatic.  No labored breathing.  Speech is clear and coherent with logical content.  Patient is alert and oriented at baseline.    Assessment and Plan: 1. Insect bite of head, unspecified part, initial encounter (Primary)  2. Tick bite, unspecified site, initial encounter  Patient bit on right side of head by what she describes as a long star stick. Prescribed 10 day course of doxycycline  per guidance and advised follow up with PCP. Counseled that if she has any worsening symptoms she is to immediately report to the ER for evaluation.   Follow Up Instructions: I discussed the assessment and treatment plan with the patient. The patient was provided an opportunity to ask questions and all were answered. The patient agreed with the plan and demonstrated an understanding of the  instructions.  A copy of instructions were sent to the patient via MyChart unless otherwise noted below.    The patient was advised to call back or seek an in-person evaluation if the symptoms worsen or if the condition fails to improve as anticipated.    Beverly Shuck, PA-C

## 2024-05-16 NOTE — Patient Instructions (Signed)
 Beverly Erickson, thank you for joining Teena Shuck, PA-C for today's virtual visit.  While this provider is not your primary care provider (PCP), if your PCP is located in our provider database this encounter information will be shared with them immediately following your visit.   A [facility] MyChart account gives you access to today's visit and all your visits, tests, and labs performed at [facility]  click here if you don't have a [facility] MyChart account or go to [email or web address]  Consent: (Patient) Beverly Erickson provided verbal consent for this virtual visit at the beginning of the encounter.  Current Medications:  Current Outpatient Medications:    albuterol  (PROVENTIL  HFA;VENTOLIN  HFA) 108 (90 Base) MCG/ACT inhaler, Inhale 2 puffs into the lungs every 6 (six) hours as needed for wheezing or shortness of breath., Disp: 2 Inhaler, Rfl: 2   albuterol  (VENTOLIN  HFA) 108 (90 Base) MCG/ACT inhaler, Inhale 2 puffs into the lungs every 6 (six) hours as needed for wheezing or shortness of breath., Disp: 18 g, Rfl: 2   budesonide -formoterol  (SYMBICORT ) 160-4.5 MCG/ACT inhaler, Inhale 2 puffs into the lungs 2 (two) times daily. (Patient not taking: Reported on 12/20/2023), Disp: 10.2 g, Rfl: 3   citalopram  (CELEXA ) 20 MG tablet, Take one-half tablet (10mg ) by mouth once daily for 1 week, then increase to 1 tablet (20mg ) once daily at bedtime., Disp: 30 tablet, Rfl: 3   fluticasone  (FLONASE ) 50 MCG/ACT nasal spray, Place 2 sprays into both nostrils daily., Disp: 16 g, Rfl: 0   HYDROcodone -acetaminophen  (NORCO) 7.5-325 MG tablet, Take 1 tablet by mouth 3 (three) times daily as needed for moderate pain (pain score 4-6)., Disp: 90 tablet, Rfl: 0   ibuprofen (ADVIL) 200 MG tablet, Take 200-400 mg by mouth every 6 (six) hours as needed for mild pain or headache., Disp: , Rfl:    meclizine  (ANTIVERT ) 25 MG tablet, Take 1 tablet (25 mg total) by mouth 3 (three) times daily as needed for  dizziness., Disp: 30 tablet, Rfl: 0   nitroGLYCERIN  (NITROSTAT ) 0.4 MG SL tablet, Place 1 tablet (0.4 mg total) under the tongue every 5 (five) minutes as needed for chest pain., Disp: 25 tablet, Rfl: 1   omeprazole  (PRILOSEC) 40 MG capsule, Take 1 capsule (40 mg total) by mouth 2 (two) times daily., Disp: 180 capsule, Rfl: 1   ondansetron  (ZOFRAN ) 4 MG tablet, Take 1 tablet (4 mg total) by mouth every 8 (eight) hours as needed for nausea or vomiting., Disp: 20 tablet, Rfl: 0   pramipexole  (MIRAPEX ) 1 MG tablet, Take 1 tablet (1 mg total) by mouth at bedtime., Disp: 30 tablet, Rfl: 2   Medications ordered in this encounter:  No orders of the defined types were placed in this encounter.    *If you need refills on other medications prior to your next appointment, please contact your pharmacy*  Follow-Up: Call back or seek an in-person evaluation if the symptoms worsen or if the condition fails to improve as anticipated.  [facility] [phone]  Other Instructions Please report to the nearest Emergency room with any worsening symptoms. Follow up with primary care provider (PCP) in 2 -3 days.    If you have been instructed to have an in-person evaluation today at a local Urgent Care facility, please use the link below. It will take you to a list of all of our available [facility], including address, phone number and hours of operation. Please do not delay care.  [facility]  If you or a family member do not have a primary care provider, use the link below to schedule a visit and establish care. When you choose a [facility] primary care physician or advanced practice provider, you gain a long-term partner in health. Find a Primary Care Provider  Learn more about [facility]'s in-office and virtual care options: [facility] - Get Care Now

## 2024-05-20 ENCOUNTER — Ambulatory Visit: Payer: Medicare (Managed Care) | Admitting: Medical

## 2024-06-10 ENCOUNTER — Other Ambulatory Visit: Payer: Self-pay | Admitting: Family

## 2024-06-10 DIAGNOSIS — G9050 Complex regional pain syndrome I, unspecified: Secondary | ICD-10-CM

## 2024-06-10 DIAGNOSIS — M549 Dorsalgia, unspecified: Secondary | ICD-10-CM

## 2024-06-11 ENCOUNTER — Other Ambulatory Visit (HOSPITAL_BASED_OUTPATIENT_CLINIC_OR_DEPARTMENT_OTHER): Payer: Self-pay

## 2024-06-11 ENCOUNTER — Other Ambulatory Visit: Payer: Self-pay | Admitting: Family Medicine

## 2024-06-11 DIAGNOSIS — G9050 Complex regional pain syndrome I, unspecified: Secondary | ICD-10-CM

## 2024-06-11 DIAGNOSIS — M549 Dorsalgia, unspecified: Secondary | ICD-10-CM

## 2024-06-11 MED ORDER — HYDROCODONE-ACETAMINOPHEN 7.5-325 MG PO TABS
1.0000 | ORAL_TABLET | Freq: Three times a day (TID) | ORAL | 0 refills | Status: DC | PRN
  Filled 2024-06-11: qty 90, 30d supply, fill #0

## 2024-06-11 NOTE — Telephone Encounter (Signed)
 Requesting: hydrocodone  7.5-325mg   Contract:12/19/21 UDS:12/19/21 Last Visit: 12/20/23 Next Visit:07/07/24 Last Refill: 05/09/24 #90 and 0RF  Please Advise

## 2024-07-06 NOTE — Assessment & Plan Note (Signed)
 hgba1c acceptable, minimize simple carbs. Increase exercise as tolerated.

## 2024-07-06 NOTE — Assessment & Plan Note (Signed)
 Encourage heart healthy diet such as MIND or DASH diet, increase exercise, avoid trans fats, simple carbohydrates and processed foods, consider a krill or fish or flaxseed oil cap daily.

## 2024-07-06 NOTE — Assessment & Plan Note (Signed)
Continues to struggle with daily pain but is trying to stay as active as able. UDS updated. No change in meds, refills given

## 2024-07-06 NOTE — Assessment & Plan Note (Signed)
 Encouraged DASH or MIND diet, decrease po intake and increase exercise as tolerated. Needs 7-8 hours of sleep nightly. Avoid trans fats, eat small, frequent meals every 4-5 hours with lean proteins, complex carbs and healthy fats. Minimize simple carbs, high fat foods and processed foods

## 2024-07-06 NOTE — Assessment & Plan Note (Signed)
 Hydrate and monitor

## 2024-07-06 NOTE — Assessment & Plan Note (Signed)
 Maintain hydration and fiber in diet. Daily probiotics. If bowels not moving can use MOM 2 tbls po in 4 oz of warm prune juice by mouth every 2-3 days. If no results then repeat in 4 hours with  Dulcolax suppository pr, may repeat again in 4 more hours as needed.

## 2024-07-07 ENCOUNTER — Ambulatory Visit (INDEPENDENT_AMBULATORY_CARE_PROVIDER_SITE_OTHER): Payer: Medicare (Managed Care) | Admitting: Family Medicine

## 2024-07-07 ENCOUNTER — Other Ambulatory Visit: Payer: Self-pay

## 2024-07-07 ENCOUNTER — Other Ambulatory Visit (HOSPITAL_BASED_OUTPATIENT_CLINIC_OR_DEPARTMENT_OTHER): Payer: Self-pay

## 2024-07-07 ENCOUNTER — Encounter: Payer: Self-pay | Admitting: Family Medicine

## 2024-07-07 VITALS — BP 138/86 | HR 74 | Temp 98.0°F | Resp 16 | Ht 67.0 in | Wt 244.8 lb

## 2024-07-07 DIAGNOSIS — K5900 Constipation, unspecified: Secondary | ICD-10-CM

## 2024-07-07 DIAGNOSIS — R350 Frequency of micturition: Secondary | ICD-10-CM

## 2024-07-07 DIAGNOSIS — E6609 Other obesity due to excess calories: Secondary | ICD-10-CM

## 2024-07-07 DIAGNOSIS — F418 Other specified anxiety disorders: Secondary | ICD-10-CM

## 2024-07-07 DIAGNOSIS — F4321 Adjustment disorder with depressed mood: Secondary | ICD-10-CM | POA: Diagnosis not present

## 2024-07-07 DIAGNOSIS — M549 Dorsalgia, unspecified: Secondary | ICD-10-CM | POA: Diagnosis not present

## 2024-07-07 DIAGNOSIS — R252 Cramp and spasm: Secondary | ICD-10-CM | POA: Diagnosis not present

## 2024-07-07 DIAGNOSIS — Z1211 Encounter for screening for malignant neoplasm of colon: Secondary | ICD-10-CM

## 2024-07-07 DIAGNOSIS — Z1231 Encounter for screening mammogram for malignant neoplasm of breast: Secondary | ICD-10-CM | POA: Diagnosis not present

## 2024-07-07 DIAGNOSIS — R739 Hyperglycemia, unspecified: Secondary | ICD-10-CM

## 2024-07-07 DIAGNOSIS — G9050 Complex regional pain syndrome I, unspecified: Secondary | ICD-10-CM | POA: Diagnosis not present

## 2024-07-07 DIAGNOSIS — G90522 Complex regional pain syndrome I of left lower limb: Secondary | ICD-10-CM

## 2024-07-07 DIAGNOSIS — E782 Mixed hyperlipidemia: Secondary | ICD-10-CM

## 2024-07-07 DIAGNOSIS — Z79899 Other long term (current) drug therapy: Secondary | ICD-10-CM

## 2024-07-07 MED ORDER — HYDROCODONE-ACETAMINOPHEN 7.5-325 MG PO TABS
1.0000 | ORAL_TABLET | Freq: Three times a day (TID) | ORAL | 0 refills | Status: DC | PRN
  Filled 2024-07-07 – 2024-07-11 (×2): qty 90, 30d supply, fill #0

## 2024-07-07 MED ORDER — CEPHALEXIN 500 MG PO CAPS
500.0000 mg | ORAL_CAPSULE | Freq: Three times a day (TID) | ORAL | 0 refills | Status: DC
  Filled 2024-07-07: qty 21, 7d supply, fill #0

## 2024-07-07 MED ORDER — CITALOPRAM HYDROBROMIDE 20 MG PO TABS
ORAL_TABLET | ORAL | 3 refills | Status: DC
  Filled 2024-07-07: qty 30, 30d supply, fill #0
  Filled 2024-08-11: qty 30, 30d supply, fill #1

## 2024-07-07 NOTE — Progress Notes (Signed)
 Subjective:    Patient ID: Beverly Erickson, female    DOB: [birth year], 53 y.o.   MRN: [id number]  Chief Complaint  Patient presents with   Medical Management of Chronic Issues    Patient presents today for a 6 months   Quality Metric Gaps    HPI Discussed the use of AI scribe software for clinical note transcription with the patient, who gave verbal consent to proceed.  History of Present Illness Beverly Erickson is a 53 year old female who presents with a painful knot under her arm and stress-related stomach issues.  She noticed a painful knot under her arm on Sunday morning. The knot is sore and tender. She is concerned about a potential infection, especially since this is the arm with plates and screws, which complicates shaving and may cause irritation.  She experiences significant stress related to family dynamics, particularly concerning her father's new partner, which has been ongoing for about six months. This stress exacerbates her stomach issues, causing her stomach to 'go into knots' and affecting her ability to use the restroom. She has not been taking her prescribed Celexa , which was intended to help manage these symptoms.  Her past medical history includes a transient ischemic attack (TIA) noted in 2015, characterized by dizziness, numbness, tingling, severe headache, blurry vision, and speech difficulties, which resolved within 12 to 16 hours. She has not experienced similar symptoms since then.  She is currently not taking Celexa  but has been prescribed it in the past for stress-related symptoms. She also has ondansetron  for nausea. She inquires about her pain medication, which is due for a refill.  She reports urinary frequency and some low back pain. She reports sinus issues and urinary frequency, but denies blood in urine.    Past Medical History:  Diagnosis Date   Anemia    Chicken pox    Depression    Depression with anxiety 08/30/2015   GERD (gastroesophageal reflux  disease)    Kidney stone 02/13/2014   Right, small   Neck pain, musculoskeletal 11/17/2016   Obesity    Other and unspecified hyperlipidemia 01/14/2014   Otitis media 11/17/2016   Preventative health care 07/05/2014   RSD (reflex sympathetic dystrophy)    Tinea corporis 06/10/2017   Tobacco use disorder 01/14/2014   1ppd   Unspecified constipation 05/25/2014   Urinary frequency 03/06/2017    Past Surgical History:  Procedure Laterality Date   CESAREAN SECTION  2000   LAPAROSCOPIC APPENDECTOMY N/A 05/22/2022   Procedure: APPENDECTOMY LAPAROSCOPIC;  Surgeon: Vernetta Berg, MD;  Location: [facility];  Service: General;  Laterality: N/A;   plate and screws in left arm  2005   humerus   WISDOM TOOTH EXTRACTION  53 yrs old    Family History  Problem Relation Age of Onset   Arthritis Mother        Living   Hypertension Mother    Thrombocytopenia Mother    Hyperlipidemia Father    Hypertension Father    Thrombocytopenia Maternal Grandmother    Stroke Maternal Grandmother    Hypertension Maternal Grandmother    Alcohol abuse Maternal Grandfather    Heart disease Maternal Grandfather    Parkinson's disease Maternal Grandfather    Alcohol abuse Paternal Grandfather    Cancer Paternal Grandfather    Hypothyroidism Daughter    Diabetes Daughter        type 1   Dementia Paternal Grandmother     Social History   Socioeconomic History   Marital  status: Married    Spouse name: Not on file   Number of children: 3   Years of education: Not on file   Highest education level: Not on file  Occupational History   Occupation: Conservation officer, nature    Comment: Disability, but works at [employer] part time  Tobacco Use   Smoking status: Every Day    Current packs/day: 1.00    Average packs/day: 1 pack/day for 20.0 years (20.0 ttl pk-yrs)    Types: Cigarettes   Smokeless tobacco: Never  Substance and Sexual Activity   Alcohol use: Not Currently    Comment: once a year.   Drug use: No   Sexual  activity: Not on file    Comment: lives with son, 74 yo daughter, boyfriend, no dietary  Other Topics Concern   Not on file  Social History Narrative   Children live nearby   Social Drivers of Health   Financial Resource Strain: Medium Risk (07/09/2021)   Overall Financial Resource Strain (CARDIA)    Difficulty of Paying Living Expenses: Somewhat hard  Food Insecurity: Food Insecurity Present (07/09/2021)   Hunger Vital Sign    Worried About Running Out of Food in the Last Year: Sometimes true    Ran Out of Food in the Last Year: Never true  Transportation Needs: No Transportation Needs (07/09/2021)   PRAPARE - Administrator, Civil Service (Medical): No    Lack of Transportation (Non-Medical): No  Physical Activity: Inactive (07/09/2021)   Exercise Vital Sign    Days of Exercise per Week: 0 days    Minutes of Exercise per Session: 0 min  Stress: Stress Concern Present (07/09/2021)   [employer] of Occupational Health - Occupational Stress Questionnaire    Feeling of Stress : To some extent  Social Connections: Moderately Isolated (07/09/2021)   Social Connection and Isolation Panel    Frequency of Communication with Friends and Family: More than three times a week    Frequency of Social Gatherings with Friends and Family: More than three times a week    Attends Religious Services: Never    Database administrator or Organizations: No    Attends Banker Meetings: Never    Marital Status: Married  Catering manager Violence: Not At Risk (07/09/2021)   Humiliation, Afraid, Rape, and Kick questionnaire    Fear of Current or Ex-Partner: No    Emotionally Abused: No    Physically Abused: No    Sexually Abused: No    Outpatient Medications Prior to Visit  Medication Sig Dispense Refill   albuterol  (PROVENTIL  HFA;VENTOLIN  HFA) 108 (90 Base) MCG/ACT inhaler Inhale 2 puffs into the lungs every 6 (six) hours as needed for wheezing or shortness of breath. 2  Inhaler 2   budesonide -formoterol  (SYMBICORT ) 160-4.5 MCG/ACT inhaler Inhale 2 puffs into the lungs 2 (two) times daily. 10.2 g 3   citalopram  (CELEXA ) 20 MG tablet Take one-half tablet (10mg ) by mouth once daily for 1 week, then increase to 1 tablet (20mg ) once daily at bedtime. 30 tablet 3   fluticasone  (FLONASE ) 50 MCG/ACT nasal spray Place 2 sprays into both nostrils daily. 16 g 0   HYDROcodone -acetaminophen  (NORCO) 7.5-325 MG tablet Take 1 tablet by mouth 3 (three) times daily as needed for moderate pain (pain score 4-6). 90 tablet 0   ibuprofen (ADVIL) 200 MG tablet Take 200-400 mg by mouth every 6 (six) hours as needed for mild pain or headache.     meclizine  (ANTIVERT ) 25  MG tablet Take 1 tablet (25 mg total) by mouth 3 (three) times daily as needed for dizziness. 30 tablet 0   nitroGLYCERIN  (NITROSTAT ) 0.4 MG SL tablet Place 1 tablet (0.4 mg total) under the tongue every 5 (five) minutes as needed for chest pain. 25 tablet 1   omeprazole  (PRILOSEC) 40 MG capsule Take 1 capsule (40 mg total) by mouth 2 (two) times daily. 180 capsule 1   ondansetron  (ZOFRAN ) 4 MG tablet Take 1 tablet (4 mg total) by mouth every 8 (eight) hours as needed for nausea or vomiting. 20 tablet 0   pramipexole  (MIRAPEX ) 1 MG tablet Take 1 tablet (1 mg total) by mouth at bedtime. 30 tablet 2   albuterol  (VENTOLIN  HFA) 108 (90 Base) MCG/ACT inhaler Inhale 2 puffs into the lungs every 6 (six) hours as needed for wheezing or shortness of breath. 18 g 2   No facility-administered medications prior to visit.    Allergies  Allergen Reactions   Codeine Nausea Only   Mucinex [Guaifenesin Er] Other (See Comments)    Nightmares    Review of Systems  Constitutional:  Positive for malaise/fatigue. Negative for fever.  HENT:  Negative for congestion.   Eyes:  Negative for blurred vision.  Respiratory:  Negative for shortness of breath.   Cardiovascular:  Negative for chest pain, palpitations and leg swelling.   Gastrointestinal:  Negative for abdominal pain, blood in stool and nausea.  Genitourinary:  Negative for dysuria and frequency.  Musculoskeletal:  Positive for joint pain. Negative for falls.  Skin:  Negative for rash.  Neurological:  Negative for dizziness, loss of consciousness and headaches.  Endo/Heme/Allergies:  Negative for environmental allergies.  Psychiatric/Behavioral:  Positive for depression. The patient is nervous/anxious.        Objective:    Physical Exam Constitutional:      General: She is not in acute distress.    Appearance: Normal appearance. She is well-developed. She is not toxic-appearing.  HENT:     Head: Normocephalic and atraumatic.     Right Ear: External ear normal.     Left Ear: External ear normal.     Nose: Nose normal.  Eyes:     General:        Right eye: No discharge.        Left eye: No discharge.     Conjunctiva/sclera: Conjunctivae normal.  Neck:     Thyroid : No thyromegaly.  Cardiovascular:     Rate and Rhythm: Normal rate and regular rhythm.     Heart sounds: Normal heart sounds. No murmur heard. Pulmonary:     Effort: Pulmonary effort is normal. No respiratory distress.     Breath sounds: Normal breath sounds.  Abdominal:     General: Bowel sounds are normal.     Palpations: Abdomen is soft.     Tenderness: There is no abdominal tenderness. There is no guarding.  Musculoskeletal:        General: Normal range of motion.     Cervical back: Neck supple.  Lymphadenopathy:     Cervical: No cervical adenopathy.  Skin:    General: Skin is warm and dry.  Neurological:     Mental Status: She is alert and oriented to person, place, and time.  Psychiatric:        Mood and Affect: Mood normal.        Behavior: Behavior normal.        Thought Content: Thought content normal.  Judgment: Judgment normal.     BP 138/86   Pulse 74   Temp 98 F (36.7 C)   Resp 16   Ht 5' 7 (1.702 m)   Wt 244 lb 12.8 oz (111 kg)   SpO2 94%    BMI 38.34 kg/m  Wt Readings from Last 3 Encounters:  07/07/24 244 lb 12.8 oz (111 kg)  12/20/23 251 lb 6.4 oz (114 kg)  06/01/23 239 lb 12.8 oz (108.8 kg)    Diabetic Foot Exam - Simple   No data filed    Lab Results  Component Value Date   WBC 7.0 12/26/2022   HGB 14.0 12/26/2022   HCT 41.9 12/26/2022   PLT 279.0 12/26/2022   GLUCOSE 87 12/26/2022   CHOL 144 12/26/2022   TRIG 67.0 12/26/2022   HDL 37.50 (L) 12/26/2022   LDLCALC 93 12/26/2022   ALT 21 12/26/2022   AST 17 12/26/2022   NA 140 12/26/2022   K 4.1 12/26/2022   CL 109 12/26/2022   CREATININE 0.64 12/26/2022   BUN 15 12/26/2022   CO2 25 12/26/2022   TSH 1.58 12/26/2022   HGBA1C 5.5 12/26/2022    Lab Results  Component Value Date   TSH 1.58 12/26/2022   Lab Results  Component Value Date   WBC 7.0 12/26/2022   HGB 14.0 12/26/2022   HCT 41.9 12/26/2022   MCV 92.4 12/26/2022   PLT 279.0 12/26/2022   Lab Results  Component Value Date   NA 140 12/26/2022   K 4.1 12/26/2022   CO2 25 12/26/2022   GLUCOSE 87 12/26/2022   BUN 15 12/26/2022   CREATININE 0.64 12/26/2022   BILITOT 0.3 12/26/2022   ALKPHOS 57 12/26/2022   AST 17 12/26/2022   ALT 21 12/26/2022   PROT 6.3 12/26/2022   ALBUMIN 3.8 12/26/2022   CALCIUM 9.0 12/26/2022   ANIONGAP 6 05/28/2022   GFR 102.77 12/26/2022   Lab Results  Component Value Date   CHOL 144 12/26/2022   Lab Results  Component Value Date   HDL 37.50 (L) 12/26/2022   Lab Results  Component Value Date   LDLCALC 93 12/26/2022   Lab Results  Component Value Date   TRIG 67.0 12/26/2022   Lab Results  Component Value Date   CHOLHDL 4 12/26/2022   Lab Results  Component Value Date   HGBA1C 5.5 12/26/2022       Assessment & Plan:  Constipation, unspecified constipation type Assessment & Plan: Maintain hydration and fiber in diet. Daily probiotics. If bowels not moving can use MOM 2 tbls po in 4 oz of warm prune juice by mouth every 2-3 days. If no  results then repeat in 4 hours with  Dulcolax suppository pr, may repeat again in 4 more hours as needed.    Hyperglycemia Assessment & Plan: hgba1c acceptable, minimize simple carbs. Increase exercise as tolerated.    Hyperlipidemia, mixed Assessment & Plan: Encourage heart healthy diet such as MIND or DASH diet, increase exercise, avoid trans fats, simple carbohydrates and processed foods, consider a krill or fish or flaxseed oil cap daily.    Muscle cramp Assessment & Plan: Hydrate and monitor    Obesity due to excess calories with serious comorbidity, unspecified class Assessment & Plan: Encouraged DASH or MIND diet, decrease po intake and increase exercise as tolerated. Needs 7-8 hours of sleep nightly. Avoid trans fats, eat small, frequent meals every 4-5 hours with lean proteins, complex carbs and healthy fats. Minimize simple carbs, high  fat foods and processed foods    RSD (reflex sympathetic dystrophy) Assessment & Plan: Continues to struggle with daily pain but is trying to stay as active as able. UDS updated. No change in meds, refills given   Screening for colon cancer    Assessment and Plan Assessment & Plan Suspected left axillary mass (rule out infection vs. other) A tender knot under the left arm was first observed on Sunday morning. Absence of redness, warmth, or fluctuance suggests a deeper issue rather than a superficial infection. Differential diagnosis includes infection or other causes. No systemic symptoms like fever or chills, though sinus issues were noted. - Order screening mammogram at the breast center in [facility] for further evaluation. - Prescribe Keflex  (cephalexin ) for one week, to be taken three times daily, to address potential infection. - Order CBC to check white blood cell count for signs of infection.  Depression and anxiety with somatic symptoms Experiencing stress-related stomach issues, likely due to serotonin imbalance affecting  both mood and gut. Celexa  (citalopram ) was previously prescribed but not taken. Counseling is recommended to address emotional stressors related to family dynamics. Discussed potential side effects of citalopram , including sleep disturbances, and the expected timeline for improvement (4-12 weeks). - Prescribe citalopram  20 mg, start with half a tablet for the first week to monitor side effects, then increase to a full tablet. - Refer to [employer] counselors for therapy to address depression, anxiety, and grief.  Constipation and recurrent abdominal pain Stomach issues are exacerbated by emotional stress, leading to constipation and abdominal discomfort. Celexa  may help manage these symptoms by addressing the underlying anxiety. - Encourage continuation of citalopram  to manage gut symptoms related to stress.  Complex regional pain syndrome type 1 of left lower extremity  Mid back pain Experiencing low back pain with urinary frequency, raising concern for possible urinary tract involvement. - Perform urinalysis to evaluate for potential urinary tract infection.  Recording duration: 26 minutes     Harlene Horton, MD

## 2024-07-07 NOTE — Patient Instructions (Signed)
 Managing Loss, Adult  People experience loss in many different ways throughout their lives. Events such as moving, changing jobs, and losing friends can create a sense of loss. The loss may be as serious as a major health change, divorce, death of a pet, or death of a loved one. All of these types of loss are likely to create a physical and emotional reaction known as grief. Grief is the result of a major change or an absence of something or someone that you count on. Grief is a normal reaction to loss.  A variety of factors can affect your grieving experience, including:  The nature of your loss.  Your relationship to what or whom you lost.  Your understanding of grief and how to manage it.  Your support system.  Be aware that when grief becomes extreme, it can lead to more severe issues like isolation, depression, anxiety, or suicidal thoughts. Talk with your health care provider if you have any of these issues.  How to manage lifestyle changes    Keep to your normal routine as much as possible.  If you have trouble focusing or doing normal activities, it is acceptable to take some time away from your normal routine.  Spend time with friends and loved ones.  Eat a healthy diet, get plenty of sleep, and rest when you feel tired.  How to recognize changes   The way that you deal with your grief will affect your ability to function as you normally do. When grieving, you may experience these changes:  Numbness, shock, sadness, anxiety, anger, denial, and guilt.  Thoughts about death.  Unexpected crying.  A physical sensation of emptiness in your stomach.  Problems sleeping and eating.  Tiredness (fatigue).  Loss of interest in normal activities.  Dreaming about or imagining seeing the person who died.  A need to remember what or whom you lost.  Difficulty thinking about anything other than your loss for a period of time.  Relief. If you have been expecting the loss for a while, you may feel a sense of relief when it  happens.  Follow these instructions at home:  Activity    Express your feelings in healthy ways, such as:  Talking with others about your loss. It may be helpful to find others who have had a similar loss, such as a support group.  Writing down your feelings in a journal.  Doing physical activities to release stress and emotional energy.  Doing creative activities like painting, sculpting, or playing or listening to music.  Practicing resilience. This is the ability to recover and adjust after facing challenges. Reading some resources that encourage resilience may help you to learn ways to practice those behaviors.     General instructions  Be patient with yourself and others. Allow the grieving process to happen, and remember that grieving takes time.  It is likely that you may never feel completely done with some grief. You may find a way to move on while still cherishing memories and feelings about your loss.  Accepting your loss is a process. It can take months or longer to adjust.  Keep all follow-up visits. This is important.  Where to find support  To get support for managing loss:  Ask your health care provider for help and recommendations, such as grief counseling or therapy.  Think about joining a support group for people who are managing a loss.  Where to find more information  You can find more information  about managing loss from:  American Society of Clinical Oncology: www.cancer.net  American Psychological Association: [employer]  Contact a health care provider if:  Your grief is extreme and keeps getting worse.  You have ongoing grief that does not improve.  Your body shows symptoms of grief, such as illness.  You feel depressed, anxious, or hopeless.  Get help right away if:  You have thoughts about hurting yourself or others.  Get help right away if you feel like you may hurt yourself or others, or have thoughts about taking your own life. Go to your nearest emergency room or:  Call 911.  Call the  National Suicide Prevention Lifeline at 985-787-5965 or 988. This is open 24 hours a day.  Text the Crisis Text Line at (308)798-1395.  Summary  Grief is the result of a major change or an absence of someone or something that you count on. Grief is a normal reaction to loss.  The depth of grief and the period of recovery depend on the type of loss and your ability to adjust to the change and process your feelings.  Processing grief requires patience and a willingness to accept your feelings and talk about your loss with people who are supportive.  It is important to find resources that work for you and to realize that people experience grief differently. There is not one grieving process that works for everyone in the same way.  Be aware that when grief becomes extreme, it can lead to more severe issues like isolation, depression, anxiety, or suicidal thoughts. Talk with your health care provider if you have any of these issues.  This information is not intended to replace advice given to you by your health care provider. Make sure you discuss any questions you have with your health care provider.  Document Revised: 05/23/2021 Document Reviewed: 05/23/2021  Elsevier Patient Education  2024 [employer].

## 2024-07-07 NOTE — Assessment & Plan Note (Signed)
 She is struggling with grief after losing her mother in May 2024 and now her father is dating someone else

## 2024-07-08 LAB — URINALYSIS, ROUTINE W REFLEX MICROSCOPIC
Bilirubin Urine: NEGATIVE
Ketones, ur: NEGATIVE
Leukocytes,Ua: NEGATIVE
Nitrite: NEGATIVE
Specific Gravity, Urine: >=1.03 — AB (ref 1.000–1.030)
Total Protein, Urine: NEGATIVE
Urine Glucose: NEGATIVE
Urobilinogen, UA: 0.2 (ref 0.0–1.0)
pH: 6 (ref 5.0–8.0)

## 2024-07-08 LAB — CBC WITH DIFFERENTIAL/PLATELET
Basophils Absolute: 0.1 K/uL (ref 0.0–0.1)
Basophils Relative: 1.6 % (ref 0.0–3.0)
Eosinophils Absolute: 0.1 K/uL (ref 0.0–0.7)
Eosinophils Relative: 1.9 % (ref 0.0–5.0)
HCT: 42.2 % (ref 36.0–46.0)
Hemoglobin: 13.9 g/dL (ref 12.0–15.0)
Lymphocytes Relative: 40 % (ref 12.0–46.0)
Lymphs Abs: 2.6 K/uL (ref 0.7–4.0)
MCHC: 33 g/dL (ref 30.0–36.0)
MCV: 91.3 fl (ref 78.0–100.0)
Monocytes Absolute: 0.4 K/uL (ref 0.1–1.0)
Monocytes Relative: 5.4 % (ref 3.0–12.0)
Neutro Abs: 3.4 K/uL (ref 1.4–7.7)
Neutrophils Relative %: 51.1 % (ref 43.0–77.0)
Platelets: 252 K/uL (ref 150.0–400.0)
RBC: 4.61 Mil/uL (ref 3.87–5.11)
RDW: 13.7 % (ref 11.5–15.5)
WBC: 6.6 K/uL (ref 4.0–10.5)

## 2024-07-08 LAB — COMPREHENSIVE METABOLIC PANEL WITH GFR
ALT: 14 U/L (ref 0–35)
AST: 13 U/L (ref 0–37)
Albumin: 4.2 g/dL (ref 3.5–5.2)
Alkaline Phosphatase: 60 U/L (ref 39–117)
BUN: 19 mg/dL (ref 6–23)
CO2: 27 meq/L (ref 19–32)
Calcium: 9.4 mg/dL (ref 8.4–10.5)
Chloride: 109 meq/L (ref 96–112)
Creatinine, Ser: 0.75 mg/dL (ref 0.40–1.20)
GFR: 91.59 mL/min (ref >60.00)
Glucose, Bld: 84 mg/dL (ref 70–99)
Potassium: 4.1 meq/L (ref 3.5–5.1)
Sodium: 143 meq/L (ref 135–145)
Total Bilirubin: 0.3 mg/dL (ref 0.2–1.2)
Total Protein: 6.5 g/dL (ref 6.0–8.3)

## 2024-07-08 LAB — URINE CULTURE
MICRO NUMBER:: 16998871
Result:: NO GROWTH
SPECIMEN QUALITY:: ADEQUATE

## 2024-07-08 LAB — LIPID PANEL
Cholesterol: 174 mg/dL (ref 0–200)
HDL: 46.1 mg/dL (ref >39.00)
LDL Cholesterol: 111 mg/dL — ABNORMAL HIGH (ref 0–99)
NonHDL: 127.95
Total CHOL/HDL Ratio: 4
Triglycerides: 85 mg/dL (ref 0.0–149.0)
VLDL: 17 mg/dL (ref 0.0–40.0)

## 2024-07-08 LAB — HEMOGLOBIN A1C: Hgb A1c MFr Bld: 5.7 % (ref 4.6–6.5)

## 2024-07-09 ENCOUNTER — Ambulatory Visit: Payer: Self-pay | Admitting: Family Medicine

## 2024-07-10 NOTE — Progress Notes (Signed)
 Patient reviewed via MyChart.

## 2024-07-11 ENCOUNTER — Other Ambulatory Visit: Payer: Self-pay

## 2024-07-11 ENCOUNTER — Other Ambulatory Visit (HOSPITAL_BASED_OUTPATIENT_CLINIC_OR_DEPARTMENT_OTHER): Payer: Self-pay

## 2024-07-21 ENCOUNTER — Telehealth: Payer: Self-pay | Admitting: *Deleted

## 2024-07-21 NOTE — Telephone Encounter (Signed)
 Pt has AWVS scheduled for 07/30/24. I will be out of office that morning. Left message for pt to call and r/s. Ok to place on wellness visit 1 schedule at [employer].

## 2024-07-30 ENCOUNTER — Ambulatory Visit: Payer: Medicare (Managed Care)

## 2024-07-31 ENCOUNTER — Inpatient Hospital Stay (HOSPITAL_BASED_OUTPATIENT_CLINIC_OR_DEPARTMENT_OTHER): Admission: RE | Admit: 2024-07-31 | Payer: Medicare (Managed Care) | Source: Ambulatory Visit

## 2024-08-05 ENCOUNTER — Inpatient Hospital Stay (HOSPITAL_BASED_OUTPATIENT_CLINIC_OR_DEPARTMENT_OTHER): Admission: RE | Admit: 2024-08-05 | Payer: Medicare (Managed Care) | Source: Ambulatory Visit

## 2024-08-11 ENCOUNTER — Other Ambulatory Visit: Payer: Self-pay | Admitting: Family Medicine

## 2024-08-11 ENCOUNTER — Other Ambulatory Visit (HOSPITAL_BASED_OUTPATIENT_CLINIC_OR_DEPARTMENT_OTHER): Payer: Self-pay

## 2024-08-11 DIAGNOSIS — G9050 Complex regional pain syndrome I, unspecified: Secondary | ICD-10-CM

## 2024-08-11 DIAGNOSIS — M549 Dorsalgia, unspecified: Secondary | ICD-10-CM

## 2024-08-12 ENCOUNTER — Other Ambulatory Visit (HOSPITAL_BASED_OUTPATIENT_CLINIC_OR_DEPARTMENT_OTHER): Payer: Self-pay

## 2024-08-12 ENCOUNTER — Other Ambulatory Visit: Payer: Self-pay

## 2024-08-12 MED ORDER — HYDROCODONE-ACETAMINOPHEN 7.5-325 MG PO TABS
1.0000 | ORAL_TABLET | Freq: Three times a day (TID) | ORAL | 0 refills | Status: DC | PRN
  Filled 2024-08-12: qty 90, 30d supply, fill #0

## 2024-08-12 NOTE — Telephone Encounter (Signed)
 Requesting: hydrocodone  7.5-325mg   Contract: 01/10/24 UDS: 12/19/21 Last Visit: 07/07/24 Next Visit:  01/26/25 Last Refill: 07/07/24 #90 and 0RF   Please Advise

## 2024-08-13 ENCOUNTER — Telehealth: Payer: Self-pay | Admitting: Family Medicine

## 2024-08-13 ENCOUNTER — Ambulatory Visit: Payer: Medicare (Managed Care)

## 2024-08-13 ENCOUNTER — Telehealth: Payer: Self-pay | Admitting: *Deleted

## 2024-08-13 NOTE — Telephone Encounter (Signed)
 Pt was scheduled for AWV today. No answer x 2 attempts, message left to call to r/s.

## 2024-08-13 NOTE — Telephone Encounter (Signed)
 Copied from CRM #[id number]. Topic: Medicare AWV >> Aug 13, 2024  1:49 PM Nathanel DEL wrote: Reason for CRM: LVM to confirm appt sched for 09/01/2024. Please call [phone]  Nathanel Paschal; Care Guide Ambulatory Clinical Support [place] [facility] Direct Dial: [phone]

## 2024-08-18 ENCOUNTER — Encounter (HOSPITAL_BASED_OUTPATIENT_CLINIC_OR_DEPARTMENT_OTHER): Payer: Self-pay

## 2024-08-18 ENCOUNTER — Ambulatory Visit (HOSPITAL_BASED_OUTPATIENT_CLINIC_OR_DEPARTMENT_OTHER)
Admission: RE | Admit: 2024-08-18 | Discharge: 2024-08-18 | Disposition: A | Discharge status: Home / Self Care | Payer: Medicare (Managed Care) | Source: Ambulatory Visit | Attending: Family Medicine | Admitting: Family Medicine

## 2024-08-18 DIAGNOSIS — Z1231 Encounter for screening mammogram for malignant neoplasm of breast: Secondary | ICD-10-CM | POA: Insufficient documentation

## 2024-08-22 ENCOUNTER — Other Ambulatory Visit (HOSPITAL_COMMUNITY): Payer: Self-pay

## 2024-09-01 ENCOUNTER — Telehealth: Payer: Medicare (Managed Care) | Admitting: Emergency Medicine

## 2024-09-01 ENCOUNTER — Ambulatory Visit: Payer: Medicare (Managed Care)

## 2024-09-01 DIAGNOSIS — J069 Acute upper respiratory infection, unspecified: Secondary | ICD-10-CM | POA: Diagnosis not present

## 2024-09-01 MED ORDER — ALBUTEROL SULFATE HFA 108 (90 BASE) MCG/ACT IN AERS: 2.0000 | INHALATION_SPRAY | Freq: Four times a day (QID) | RESPIRATORY_TRACT | 0 refills | Status: AC | PRN

## 2024-09-01 MED ORDER — PROMETHAZINE-DM 6.25-15 MG/5ML PO SYRP: 5.0000 mL | ORAL_SOLUTION | Freq: Four times a day (QID) | ORAL | 0 refills | Status: AC | PRN

## 2024-09-01 NOTE — Patient Instructions (Signed)
 Reena Goldsmith, thank you for joining Jon CHRISTELLA Belt, NP for today's virtual visit.  While this provider is not your primary care provider (PCP), if your PCP is located in our provider database this encounter information will be shared with them immediately following your visit.   A [facility] MyChart account gives you access to today's visit and all your visits, tests, and labs performed at [facility]  click here if you don't have a [facility] MyChart account or go to [email or web address]  Consent: (Patient) Beverly Erickson provided verbal consent for this virtual visit at the beginning of the encounter.  Current Medications:  Current Outpatient Medications:    albuterol  (VENTOLIN  HFA) 108 (90 Base) MCG/ACT inhaler, Inhale 2 puffs into the lungs every 6 (six) hours as needed for wheezing or shortness of breath., Disp: 8 g, Rfl: 0   promethazine-dextromethorphan (PROMETHAZINE-DM) 6.25-15 MG/5ML syrup, Take 5 mLs by mouth 4 (four) times daily as needed for cough., Disp: 120 mL, Rfl: 0   budesonide -formoterol  (SYMBICORT ) 160-4.5 MCG/ACT inhaler, Inhale 2 puffs into the lungs 2 (two) times daily., Disp: 10.2 g, Rfl: 3   citalopram  (CELEXA ) 20 MG tablet, Take one-half tablet (10mg ) by mouth once daily for 1 week, then increase to 1 tablet (20mg ) once daily at bedtime., Disp: 30 tablet, Rfl: 3   fluticasone  (FLONASE ) 50 MCG/ACT nasal spray, Place 2 sprays into both nostrils daily., Disp: 16 g, Rfl: 0   HYDROcodone -acetaminophen  (NORCO) 7.5-325 MG tablet, Take 1 tablet by mouth 3 (three) times daily as needed for moderate pain (pain score 4-6)., Disp: 90 tablet, Rfl: 0   ibuprofen (ADVIL) 200 MG tablet, Take 200-400 mg by mouth every 6 (six) hours as needed for mild pain or headache., Disp: , Rfl:    meclizine  (ANTIVERT ) 25 MG tablet, Take 1 tablet (25 mg total) by mouth 3 (three) times daily as needed for dizziness., Disp: 30 tablet, Rfl: 0   nitroGLYCERIN  (NITROSTAT ) 0.4 MG SL tablet,  Place 1 tablet (0.4 mg total) under the tongue every 5 (five) minutes as needed for chest pain., Disp: 25 tablet, Rfl: 1   omeprazole  (PRILOSEC) 40 MG capsule, Take 1 capsule (40 mg total) by mouth 2 (two) times daily., Disp: 180 capsule, Rfl: 1   ondansetron  (ZOFRAN ) 4 MG tablet, Take 1 tablet (4 mg total) by mouth every 8 (eight) hours as needed for nausea or vomiting., Disp: 20 tablet, Rfl: 0   pramipexole  (MIRAPEX ) 1 MG tablet, Take 1 tablet (1 mg total) by mouth at bedtime., Disp: 30 tablet, Rfl: 2   Medications ordered in this encounter:  Meds ordered this encounter  Medications   albuterol  (VENTOLIN  HFA) 108 (90 Base) MCG/ACT inhaler    Sig: Inhale 2 puffs into the lungs every 6 (six) hours as needed for wheezing or shortness of breath.    Dispense:  8 g    Refill:  0   promethazine-dextromethorphan (PROMETHAZINE-DM) 6.25-15 MG/5ML syrup    Sig: Take 5 mLs by mouth 4 (four) times daily as needed for cough.    Dispense:  120 mL    Refill:  0     *If you need refills on other medications prior to your next appointment, please contact your pharmacy*  Follow-Up: Call back or seek an in-person evaluation if the symptoms worsen or if the condition fails to improve as anticipated.  [facility] [phone]  Other Instructions  Try using saline irrigation, such as with a neti pot, several times a day  while you are sick. Many neti pots come with salt packets premeasured to use to make saline. If you use your own salt, make sure it is kosher salt or sea salt (don't use table salt as it has iodine in it and you don't need that in your nose). Use distilled water to make saline. If you mix your own saline using your own salt, the recipe is 1/4 teaspoon salt in 1 cup warm water. Using saline irrigation can help prevent and treat sinus infections.  Ok to continue using tylenol  sinus medicine but check ingredients and if it has the same ingredient dextromethorphan as the  prescription cough medicine, do not take them together.    If you have been instructed to have an in-person evaluation today at a local Urgent Care facility, please use the link below. It will take you to a list of all of our available [facility], including address, phone number and hours of operation. Please do not delay care.  [facility]  If you or a family member do not have a primary care provider, use the link below to schedule a visit and establish care. When you choose a [facility] primary care physician or advanced practice provider, you gain a long-term partner in health. Find a Primary Care Provider  Learn more about [facility]'s in-office and virtual care options: [facility] - Get Care Now

## 2024-09-01 NOTE — Progress Notes (Signed)
 Virtual Visit Consent   Beverly Erickson, you are scheduled for a virtual visit with a [facility] provider today. Just as with appointments in the office, your consent must be obtained to participate. Your consent will be active for this visit and any virtual visit you may have with one of our providers in the next 365 days. If you have a MyChart account, a copy of this consent can be sent to you electronically.  As this is a virtual visit, video technology does not allow for your provider to perform a traditional examination. This may limit your provider's ability to fully assess your condition. If your provider identifies any concerns that need to be evaluated in person or the need to arrange testing (such as labs, EKG, etc.), we will make arrangements to do so. Although advances in technology are sophisticated, we cannot ensure that it will always work on either your end or our end. If the connection with a video visit is poor, the visit may have to be switched to a telephone visit. With either a video or telephone visit, we are not always able to ensure that we have a secure connection.  By engaging in this virtual visit, you consent to the provision of healthcare and authorize for your insurance to be billed (if applicable) for the services provided during this visit. Depending on your insurance coverage, you may receive a charge related to this service.  I need to obtain your verbal consent now. Are you willing to proceed with your visit today? Beverly Erickson has provided verbal consent on 09/01/2024 for a virtual visit (video or telephone). Jon CHRISTELLA Belt, NP  Date: 09/01/2024 2:51 PM   Virtual Visit via Video Note   I, Jon CHRISTELLA Belt, connected with  Beverly Erickson  ([id number], May 19, 1971) on 09/01/24 at  2:45 PM EST by a video-enabled telemedicine application and verified that I am speaking with the correct person using two identifiers.  Location: Patient: Virtual Visit Location Patient:  Home Provider: Virtual Visit Location Provider: Home Office   I discussed the limitations of evaluation and management by telemedicine and the availability of in person appointments. The patient expressed understanding and agreed to proceed.    History of Present Illness: Beverly Erickson is a 54 y.o. who identifies as a female who was assigned female at birth, and is being seen today for feeling sick.   11/10 felt weak, achy. All week long, malaise but no other sx. 11/15 coughing, sneezing, runny nose. Cough is severe, causing throat pain. Some SOB in chest with coughing spells or  when exerting herself. No wheezing. Has an inhaler that she uses for anxiety but it is expired - requests a new one.   Taking tylenol  sinus, flonase .   Nasal discharge is clear. Has not tested self for covid at home.   HPI: HPI  Problems:  Patient Active Problem List   Diagnosis Date Noted   Vertigo 12/26/2022   Generalized abdominal pain 05/18/2022   RUQ pain 12/19/2021   High risk medication use 12/19/2021   Hot flashes 12/19/2021   Contact dermatitis 07/29/2021   Bruise 03/07/2021   Left hip pain 09/14/2020   Allergies 07/14/2020   Fatigue 01/22/2018   Tinea corporis 06/10/2017   Muscle cramp 03/07/2017   Low back pain 03/07/2017   Urinary frequency 03/06/2017   Neck pain, musculoskeletal 11/17/2016   Otitis media 11/17/2016   Breast cancer screening 08/13/2016   Abdominal pain, chronic, epigastric 08/13/2016   Witnessed episode of apnea  08/13/2016   Acute bacterial sinusitis 10/20/2015   Depression with anxiety 08/30/2015   Elevated blood pressure 08/11/2015   Atypical chest pain 05/23/2015   Preventative health care 07/05/2014   Constipation 05/25/2014   Lesion of tonsil 05/03/2014   Kidney stone 02/13/2014   Mid back pain on right side 02/13/2014   Hyperglycemia 01/14/2014   Hyperlipidemia, mixed 01/14/2014   Obesity    RSD (reflex sympathetic dystrophy)    GERD (gastroesophageal reflux  disease) 12/05/2013   Complex regional pain syndrome I of lower limb 02/07/2013    Allergies:  Allergies  Allergen Reactions   Codeine Nausea Only   Mucinex [Guaifenesin Er] Other (See Comments)    Nightmares   Medications:  Current Outpatient Medications:    albuterol  (VENTOLIN  HFA) 108 (90 Base) MCG/ACT inhaler, Inhale 2 puffs into the lungs every 6 (six) hours as needed for wheezing or shortness of breath., Disp: 8 g, Rfl: 0   promethazine-dextromethorphan (PROMETHAZINE-DM) 6.25-15 MG/5ML syrup, Take 5 mLs by mouth 4 (four) times daily as needed for cough., Disp: 120 mL, Rfl: 0   budesonide -formoterol  (SYMBICORT ) 160-4.5 MCG/ACT inhaler, Inhale 2 puffs into the lungs 2 (two) times daily., Disp: 10.2 g, Rfl: 3   citalopram  (CELEXA ) 20 MG tablet, Take one-half tablet (10mg ) by mouth once daily for 1 week, then increase to 1 tablet (20mg ) once daily at bedtime., Disp: 30 tablet, Rfl: 3   fluticasone  (FLONASE ) 50 MCG/ACT nasal spray, Place 2 sprays into both nostrils daily., Disp: 16 g, Rfl: 0   HYDROcodone -acetaminophen  (NORCO) 7.5-325 MG tablet, Take 1 tablet by mouth 3 (three) times daily as needed for moderate pain (pain score 4-6)., Disp: 90 tablet, Rfl: 0   ibuprofen (ADVIL) 200 MG tablet, Take 200-400 mg by mouth every 6 (six) hours as needed for mild pain or headache., Disp: , Rfl:    meclizine  (ANTIVERT ) 25 MG tablet, Take 1 tablet (25 mg total) by mouth 3 (three) times daily as needed for dizziness., Disp: 30 tablet, Rfl: 0   nitroGLYCERIN  (NITROSTAT ) 0.4 MG SL tablet, Place 1 tablet (0.4 mg total) under the tongue every 5 (five) minutes as needed for chest pain., Disp: 25 tablet, Rfl: 1   omeprazole  (PRILOSEC) 40 MG capsule, Take 1 capsule (40 mg total) by mouth 2 (two) times daily., Disp: 180 capsule, Rfl: 1   ondansetron  (ZOFRAN ) 4 MG tablet, Take 1 tablet (4 mg total) by mouth every 8 (eight) hours as needed for nausea or vomiting., Disp: 20 tablet, Rfl: 0   pramipexole  (MIRAPEX )  1 MG tablet, Take 1 tablet (1 mg total) by mouth at bedtime., Disp: 30 tablet, Rfl: 2  Observations/Objective: Patient is well-developed, well-nourished in no acute distress.  Resting comfortably  at home.  Head is normocephalic, atraumatic.  No labored breathing.  Speech is clear and coherent with logical content.  Patient is alert and oriented at baseline.    Assessment and Plan: 1. Upper respiratory tract infection, unspecified type (Primary) - albuterol  (VENTOLIN  HFA) 108 (90 Base) MCG/ACT inhaler; Inhale 2 puffs into the lungs every 6 (six) hours as needed for wheezing or shortness of breath.  Dispense: 8 g; Refill: 0 - promethazine-dextromethorphan (PROMETHAZINE-DM) 6.25-15 MG/5ML syrup; Take 5 mLs by mouth 4 (four) times daily as needed for cough.  Dispense: 120 mL; Refill: 0  Discussed supportive care. Not a bacterial infection at this time.   Follow Up Instructions: I discussed the assessment and treatment plan with the patient. The patient was provided an opportunity to  ask questions and all were answered. The patient agreed with the plan and demonstrated an understanding of the instructions.  A copy of instructions were sent to the patient via MyChart unless otherwise noted below.   The patient was advised to call back or seek an in-person evaluation if the symptoms worsen or if the condition fails to improve as anticipated.    Jon CHRISTELLA Belt, NP

## 2024-09-02 ENCOUNTER — Other Ambulatory Visit: Payer: Self-pay

## 2024-09-10 ENCOUNTER — Ambulatory Visit: Payer: Medicare (Managed Care) | Admitting: *Deleted

## 2024-09-10 ENCOUNTER — Encounter: Payer: Self-pay | Admitting: Emergency Medicine

## 2024-09-10 ENCOUNTER — Other Ambulatory Visit (HOSPITAL_BASED_OUTPATIENT_CLINIC_OR_DEPARTMENT_OTHER): Payer: Self-pay

## 2024-09-10 ENCOUNTER — Other Ambulatory Visit (HOSPITAL_COMMUNITY): Payer: Self-pay

## 2024-09-10 ENCOUNTER — Other Ambulatory Visit: Payer: Self-pay | Admitting: Family Medicine

## 2024-09-10 ENCOUNTER — Telehealth: Payer: Self-pay

## 2024-09-10 ENCOUNTER — Other Ambulatory Visit: Payer: Self-pay | Admitting: Family

## 2024-09-10 ENCOUNTER — Telehealth: Payer: Self-pay | Admitting: *Deleted

## 2024-09-10 VITALS — Ht 67.0 in | Wt 244.0 lb

## 2024-09-10 DIAGNOSIS — U071 COVID-19: Secondary | ICD-10-CM

## 2024-09-10 DIAGNOSIS — Z124 Encounter for screening for malignant neoplasm of cervix: Secondary | ICD-10-CM

## 2024-09-10 DIAGNOSIS — F4321 Adjustment disorder with depressed mood: Secondary | ICD-10-CM

## 2024-09-10 DIAGNOSIS — Z1211 Encounter for screening for malignant neoplasm of colon: Secondary | ICD-10-CM | POA: Diagnosis not present

## 2024-09-10 DIAGNOSIS — F418 Other specified anxiety disorders: Secondary | ICD-10-CM

## 2024-09-10 DIAGNOSIS — H9190 Unspecified hearing loss, unspecified ear: Secondary | ICD-10-CM

## 2024-09-10 DIAGNOSIS — G9050 Complex regional pain syndrome I, unspecified: Secondary | ICD-10-CM

## 2024-09-10 DIAGNOSIS — Z0000 Encounter for general adult medical examination without abnormal findings: Secondary | ICD-10-CM

## 2024-09-10 DIAGNOSIS — Z122 Encounter for screening for malignant neoplasm of respiratory organs: Secondary | ICD-10-CM

## 2024-09-10 DIAGNOSIS — M549 Dorsalgia, unspecified: Secondary | ICD-10-CM

## 2024-09-10 MED ORDER — BUDESONIDE-FORMOTEROL FUMARATE 160-4.5 MCG/ACT IN AERO
2.0000 | INHALATION_SPRAY | Freq: Two times a day (BID) | RESPIRATORY_TRACT | 3 refills | Status: DC
  Filled 2024-09-10: qty 10.2, 30d supply, fill #0

## 2024-09-10 MED ORDER — HYDROCODONE-ACETAMINOPHEN 7.5-325 MG PO TABS
1.0000 | ORAL_TABLET | Freq: Three times a day (TID) | ORAL | 0 refills | Status: DC | PRN
  Filled 2024-09-10: qty 90, 30d supply, fill #0

## 2024-09-10 MED ORDER — CITALOPRAM HYDROBROMIDE 20 MG PO TABS
ORAL_TABLET | ORAL | 3 refills | Status: AC
  Filled 2024-09-10: qty 30, 30d supply, fill #0
  Filled 2024-10-10: qty 30, 30d supply, fill #1
  Filled 2024-11-12: qty 30, 34d supply, fill #2

## 2024-09-10 NOTE — Telephone Encounter (Signed)
 Left voicemail that Rx has been refilled to [facility] and will be closed tomorrow and to reopen on Friday.

## 2024-09-10 NOTE — Progress Notes (Signed)
 Please attest this visit in the absence of patient primary care provider.   Chief Complaint  Patient presents with   Medicare Wellness     Subjective:   Beverly Erickson is a 53 y.o. female who presents for a Medicare Annual Wellness Visit.  Allergies (verified) Codeine and Mucinex [guaifenesin er]   History: Past Medical History:  Diagnosis Date   Anemia    Chicken pox    Depression    Depression with anxiety 08/30/2015   GERD (gastroesophageal reflux disease)    Kidney stone 02/13/2014   Right, small   Neck pain, musculoskeletal 11/17/2016   Obesity    Other and unspecified hyperlipidemia 01/14/2014   Otitis media 11/17/2016   Preventative health care 07/05/2014   RSD (reflex sympathetic dystrophy)    Tinea corporis 06/10/2017   Tobacco use disorder 01/14/2014   1ppd   Unspecified constipation 05/25/2014   Urinary frequency 03/06/2017   Past Surgical History:  Procedure Laterality Date   CESAREAN SECTION  2000   LAPAROSCOPIC APPENDECTOMY N/A 05/22/2022   Procedure: APPENDECTOMY LAPAROSCOPIC;  Surgeon: Vernetta Berg, MD;  Location: [facility];  Service: General;  Laterality: N/A;   plate and screws in left arm  2005   humerus   WISDOM TOOTH EXTRACTION  53 yrs old   Family History  Problem Relation Age of Onset   Arthritis Mother        Living   Hypertension Mother    Thrombocytopenia Mother    Hyperlipidemia Father    Hypertension Father    Thrombocytopenia Maternal Grandmother    Stroke Maternal Grandmother    Hypertension Maternal Grandmother    Alcohol abuse Maternal Grandfather    Heart disease Maternal Grandfather    Parkinson's disease Maternal Grandfather    Alcohol abuse Paternal Grandfather    Cancer Paternal Grandfather    Hypothyroidism Daughter    Diabetes Daughter        type 1   Dementia Paternal Grandmother    Social History   Occupational History   Occupation: conservation officer, nature    Comment: Disability, but works at [employer] part time  Tobacco Use    Smoking status: Every Day    Current packs/day: 1.00    Average packs/day: 1 pack/day for 20.0 years (20.0 ttl pk-yrs)    Types: Cigarettes   Smokeless tobacco: Never  Substance and Sexual Activity   Alcohol use: Not Currently    Comment: once a year.   Drug use: No   Sexual activity: Not on file    Comment: lives with son, 6 yo daughter, boyfriend, no dietary   Tobacco Counseling Ready to quit: Not Answered Counseling given: Not Answered  SDOH Screenings   Food Insecurity: No Food Insecurity (09/10/2024)  Housing: Low Risk  (09/10/2024)  Transportation Needs: No Transportation Needs (09/10/2024)  Utilities: At Risk (09/10/2024)  Alcohol Screen: Low Risk  (09/10/2024)  Depression (PHQ2-9): Medium Risk (09/10/2024)  Financial Resource Strain: Medium Risk (07/09/2021)  Physical Activity: Inactive (09/10/2024)  Social Connections: Moderately Isolated (09/10/2024)  Stress: Stress Concern Present (09/10/2024)  Tobacco Use: High Risk (09/10/2024)   See flowsheets for full screening details  Depression Screen PHQ 2 & 9 Depression Scale- Over the past 2 weeks, how often have you been bothered by any of the following problems? Little interest or pleasure in doing things: 1 Feeling down, depressed, or hopeless (PHQ Adolescent also includes...irritable): 1 (when she worries about not being able to clean house like she used to) PHQ-2 Total Score: 2 Trouble  falling or staying asleep, or sleeping too much: 2 (difficulty falling asleep) Feeling tired or having little energy: 1 Poor appetite or overeating (PHQ Adolescent also includes...weight loss): 0 Feeling bad about yourself - or that you are a failure or have let yourself or your family down: 1 Trouble concentrating on things, such as reading the newspaper or watching television (PHQ Adolescent also includes...like school work): 0 Moving or speaking so slowly that other people could have noticed. Or the opposite - being so fidgety or  restless that you have been moving around a lot more than usual: 0 Thoughts that you would be better off dead, or of hurting yourself in some way: 0 PHQ-9 Total Score: 6 If you checked off any problems, how difficult have these problems made it for you to do your work, take care of things at home, or get along with other people?: Somewhat difficult (somewhat to very difficult)  Depression Treatment Depression Interventions/Treatment : Medication; Counseling (pt has been referred to therapist but hasn't chosen one yet)     Goals Addressed   None    Visit info / Clinical Intake: Medicare Wellness Visit Type:: Subsequent Annual Wellness Visit Persons participating in visit:: patient Medicare Wellness Visit Mode:: Telephone If telephone:: video declined Because this visit was a virtual/telehealth visit:: pt reported vitals If Telephone or Video please confirm:: I connected with the patient using audio enabled telemedicine application and verified that I am speaking with the correct person using two identifiers; I discussed the limitations of evaluation and management by telemedicine; The patient expressed understanding and agreed to proceed Patient Location:: home Provider Location:: office Information given by:: patient Interpreter Needed?: No Pre-visit prep was completed: yes AWV questionnaire completed by patient prior to visit?: no Living arrangements:: lives with spouse/significant other; with family/others Patient's Overall Health Status Rating: good Typical amount of pain: (!) a lot Does pain affect daily life?: (!) yes Are you currently prescribed opioids?: (!) yes (hydrocodone  and ibuprofen for breakthrough pain)  Dietary Habits and Nutritional Risks How many meals a day?: 2 (snacks in between  and is my downfall) Eats fruit and vegetables daily?: yes Most meals are obtained by: preparing own meals; eating out In the last 2 weeks, have you had any of the following?:  none Diabetic:: no  Functional Status Activities of Daily Living (to include ambulation/medication): Independent Ambulation: Independent Medication Administration: Independent Home Management: Independent Manage your own finances?: yes Primary transportation is: driving Concerns about vision?: no *vision screening is required for [facility]* (past due for eye exam, will send list for doctor's in [facility].) Concerns about hearing?: (!) yes (has to ask others to repeat themselves. Would like referral to audiology) Uses hearing aids?: no  Fall Screening Falls in the past year?: 0 Number of falls in past year: 0 Was there an injury with Fall?: 0 Fall Risk Category Calculator: 0 Patient Fall Risk Level: Low Fall Risk  Fall Risk Patient at Risk for Falls Due to: Orthopedic patient; Impaired mobility Fall risk Follow up: Falls evaluation completed  Home and Transportation Safety: All rugs have non-skid backing?: yes All stairs or steps have railings?: yes Grab bars in the bathtub or shower?: yes (where to get grab bars?) Have non-skid surface in bathtub or shower?: (!) no Good home lighting?: yes Regular seat belt use?: yes Hospital stays in the last year:: no  Cognitive Assessment Difficulty concentrating, remembering, or making decisions? : yes (works part time at [employer], they tell pump, gas and amount and  they ask for something else she forgets first information she was given.) Will 6CIT or Mini Cog be Completed: yes What year is it?: 0 points What month is it?: 0 points Give patient an address phrase to remember (5 components): [address] [place]  About what time is it?: 0 points Count backwards from 20 to 1: 0 points Say the months of the year in reverse: 0 points Repeat the address phrase from earlier: 2 points 6 CIT Score: 2 points  Advance Directives (For Healthcare) Does Patient Have a Medical Advance Directive?: No Would patient like information on creating a  medical advance directive?: Yes (MAU/Ambulatory/Procedural Areas - Information given)  Reviewed/Updated  Reviewed/Updated: Reviewed All (Medical, Surgical, Family, Medications, Allergies, Care Teams, Patient Goals)        Objective:    Today's Vitals   09/10/24 0902  Weight: 244 lb (110.7 kg)  Height: 5' 7 (1.702 m)   Body mass index is 38.22 kg/m.  Current Medications (verified) Outpatient Encounter Medications as of 09/10/2024  Medication Sig   albuterol  (VENTOLIN  HFA) 108 (90 Base) MCG/ACT inhaler Inhale 2 puffs into the lungs every 6 (six) hours as needed for wheezing or shortness of breath.   fluticasone  (FLONASE ) 50 MCG/ACT nasal spray Place 2 sprays into both nostrils daily.   ibuprofen (ADVIL) 200 MG tablet Take 200-400 mg by mouth every 6 (six) hours as needed for mild pain or headache.   nitroGLYCERIN  (NITROSTAT ) 0.4 MG SL tablet Place 1 tablet (0.4 mg total) under the tongue every 5 (five) minutes as needed for chest pain.   omeprazole  (PRILOSEC) 40 MG capsule Take 1 capsule (40 mg total) by mouth 2 (two) times daily.   POTASSIUM CHLORIDE PO Take 1 tablet by mouth at bedtime.   promethazine -dextromethorphan (PROMETHAZINE -DM) 6.25-15 MG/5ML syrup Take 5 mLs by mouth 4 (four) times daily as needed for cough.   [DISCONTINUED] budesonide -formoterol  (SYMBICORT ) 160-4.5 MCG/ACT inhaler Inhale 2 puffs into the lungs 2 (two) times daily.   [DISCONTINUED] citalopram  (CELEXA ) 20 MG tablet Take one-half tablet (10mg ) by mouth once daily for 1 week, then increase to 1 tablet (20mg ) once daily at bedtime.   [DISCONTINUED] HYDROcodone -acetaminophen  (NORCO) 7.5-325 MG tablet Take 1 tablet by mouth 3 (three) times daily as needed for moderate pain (pain score 4-6).   [DISCONTINUED] ondansetron  (ZOFRAN ) 4 MG tablet Take 1 tablet (4 mg total) by mouth every 8 (eight) hours as needed for nausea or vomiting.   budesonide -formoterol  (SYMBICORT ) 160-4.5 MCG/ACT inhaler Inhale 2 puffs into the  lungs 2 (two) times daily.   citalopram  (CELEXA ) 20 MG tablet Take 0.5 tablets (10 mg total) by mouth daily for 7 days, THEN 1 tablet (20 mg total) at bedtime.   meclizine  (ANTIVERT ) 25 MG tablet Take 1 tablet (25 mg total) by mouth 3 (three) times daily as needed for dizziness. (Patient not taking: Reported on 09/10/2024)   pramipexole  (MIRAPEX ) 1 MG tablet Take 1 tablet (1 mg total) by mouth at bedtime. (Patient not taking: Reported on 09/10/2024)   No facility-administered encounter medications on file as of 09/10/2024.   Hearing/Vision screen No results found. Immunizations and Health Maintenance Health Maintenance  Topic Date Due   Pneumococcal Vaccine: 50+ Years (1 of 2 - PCV) Never done   Hepatitis B Vaccines 19-59 Average Risk (1 of 3 - 19+ 3-dose series) Never done   Zoster Vaccines- Shingrix (1 of 2) Never done   Cervical Cancer Screening (HPV/Pap Cotest)  01/17/2017   Fecal DNA (Cologuard)  Never done   Lung Cancer Screening  Never done   Influenza Vaccine  Never done   Medicare Annual Wellness (AWV)  09/10/2025   DTaP/Tdap/Td (3 - Td or Tdap) 02/17/2026   Mammogram  08/18/2026   HIV Screening  Addressed   HPV VACCINES  Aged Out   Meningococcal B Vaccine  Aged Out   COVID-19 Vaccine  Discontinued   Hepatitis C Screening  Discontinued        Assessment/Plan:  This is a routine wellness examination for Beverly Erickson.  Patient Care Team: Domenica Harlene LABOR, MD as PCP - General (Family Medicine)  I have personally reviewed and noted the following in the patient's chart:   Medical and social history Use of alcohol, tobacco or illicit drugs  Current medications and supplements including opioid prescriptions. Functional ability and status Nutritional status Physical activity Advanced directives List of other physicians Hospitalizations, surgeries, and ER visits in previous 12 months Vitals Screenings to include cognitive, depression, and falls Referrals and  appointments  Orders Placed This Encounter  Procedures   Cologuard    Standing Status:   Future    Expected Date:   09/10/2024    Expiration Date:   09/10/2025   Ambulatory Referral Lung Cancer Screening [facility]    Referral Priority:   Routine    Referral Type:   Consultation    Referral Reason:   Specialty Services Required    Number of Visits Requested:   1   Ambulatory referral to Obstetrics / Gynecology    Referral Priority:   Routine    Referral Type:   Consultation    Referral Reason:   Specialty Services Required    Requested Specialty:   Obstetrics and Gynecology    Number of Visits Requested:   1   Ambulatory referral to Audiology    Referral Priority:   Routine    Referral Type:   Audiology Exam    Referral Reason:   Specialty Services Required    Requested Specialty:   Audiology    Number of Visits Requested:   1   In addition, I have reviewed and discussed with patient certain preventive protocols, quality metrics, and best practice recommendations. A written personalized care plan for preventive services as well as general preventive health recommendations were provided to patient.   Lolita Libra, CMA   09/10/2024   Return in 1 year (on 09/10/2025).  After Visit Summary: (MyChart) Due to this being a telephonic visit, the after visit summary with patients personalized plan was offered to patient via MyChart   Nurse Notes: see phone note

## 2024-09-10 NOTE — Patient Instructions (Addendum)
 Beverly Erickson,  Thank you for taking the time for your Medicare Wellness Visit. I appreciate your continued commitment to your health goals. Please review the care plan we discussed, and feel free to reach out if I can assist you further.  Please note that Annual Wellness Visits do not include a physical exam. Some assessments may be limited, especially if the visit was conducted virtually. If needed, we may recommend an in-person follow-up with your provider.  Ongoing Care Seeing your primary care provider every 3 to 6 months helps us  monitor your health and provide consistent, personalized care.   Dr Domenica: 01/26/25 2pm Medicare AWV: 09/15/25 9:40am  Referrals If a referral was made during today's visit and you haven't received any updates within two weeks, please contact the referred provider directly to check on the status.  For more information regarding the LIEAP program visit the [facility] DHHS website using the following link: [place] Division of Social Services - About [employer] Pharmacist, Hospital [employer]: Emergency Assistance Office [address] [place], [place] [address] Must go in person to apply the 1st & 4th Monday of each month. Must be inline by 8:30am to receive an application.  [employer]: [phone]  See below list of eye doctors for [employer].  [facility]) for pap smear: [phone]  [facility] for lung cancer screening: [phone]   Recommended Screenings:  You will need to get the following vaccines at your local pharmacy: Pneumonia, flu and Shinles (if you decide to get them)  Health Maintenance  Topic Date Due   Pneumococcal Vaccine for age over 56 (1 of 2 - PCV) Never done   Hepatitis B Vaccine (1 of 3 - 19+ 3-dose series) Never done   Zoster (Shingles) Vaccine (1 of 2) Never done   Pap with HPV screening  01/17/2017   Cologuard (Stool DNA test)  Never done   Screening for Lung Cancer  Never  done   Medicare Annual Wellness Visit  07/09/2022   Flu Shot  Never done   DTaP/Tdap/Td vaccine (3 - Td or Tdap) 02/17/2026   Breast Cancer Screening  08/18/2026   HIV Screening  Addressed   HPV Vaccine  Aged Out   Meningitis B Vaccine  Aged Out   COVID-19 Vaccine  Discontinued   Hepatitis C Screening  Discontinued       09/10/2024    9:10 AM  Advanced Directives  Does Patient Have a Medical Advance Directive? No  Would patient like information on creating a medical advance directive? Yes (MAU/Ambulatory/Procedural Areas - Information given)  Please let me know if you do not receive your Advanced Directive Packet within 1 week. Once completed and notarized, you may return a copy of your Advanced Directive(s) by either of the following:  Bring a copy of your health care power of attorney and living will to the office to be added to your chart at your convenience. You can mail a copy to [facility] [address] [place], [place] [address] or email to [email or web address]   Vision: Annual vision screenings are recommended for early detection of glaucoma, cataracts, and diabetic retinopathy. These exams can also reveal signs of chronic conditions such as diabetes and high blood pressure.  Dental: Annual dental screenings help detect early signs of oral cancer, gum disease, and other conditions linked to overall health, including heart disease and diabetes.  Please see the attached documents for additional preventive care recommendations.  [facility] Doctor Locations [facility]   [facility] [address]    [address]   [place], [place] [address]    [place], [place] [address] Phone: [phone]   Phone:  [phone] Fax: [phone]    Fax:  [phone]  [facility]          [facility] [address]   [address] [place], [place] [address]    [place], [place] [address] Phone: [phone]   Phone:  [phone] Fax: [phone]    Fax:  [phone]  [facility]    [facility] [phone]    [address] [place], [place] [address]   [place], [place] [address] Phone:  [phone]   Phone: [phone] Fax:  [phone]    Fax:  [phone]  [facility] [phone] [place]. [facility]   [phone] [address] [place], [place] [address]    [place], [place] [address] [phone]     Phone: [phone]     Fax: [phone]  [facility]      [address] [place], [place] [address] Phone: [phone] Fax: [phone]   [facility] [address]  [place], [place] [address] Phone:  [phone] Fax:  [phone]  [facility] [address]  [place], [place] [address] Phone: [phone] Fax:  [phone]

## 2024-09-10 NOTE — Telephone Encounter (Signed)
 Pharmacy Patient Advocate Encounter   Received notification from RX Request Messages that prior authorization for Budesonide -Formoterol  160-4.26mcg is required/requested.   Insurance verification completed.   The patient is insured through [employer].   Per test claim: PA required; PA started via CoverMyMeds. KEY [id number] . Please see clinical question(s) below that I am not finding the answer to in their chart and advise.   Is the diagnosis for Covid the correct dx to use?   Please advise.

## 2024-09-10 NOTE — Telephone Encounter (Signed)
 Pt had AWV today.  Reports utility difficulties and currently being threatened to be turned off. Community resources provided for [employer] via [employer] and [employer] of [employer].   Pt noted decreased hearing and requested testing.  Audiology referral was placed.   Pt also requests refill of Hydrocodone . States she will run out before office reopens after the holiday. Uses [facility]. Please call pt if refill cannot be given early and leave a message.

## 2024-09-15 ENCOUNTER — Other Ambulatory Visit (HOSPITAL_COMMUNITY): Payer: Self-pay

## 2024-09-15 ENCOUNTER — Other Ambulatory Visit: Payer: Self-pay | Admitting: Family

## 2024-09-15 MED ORDER — BUDESONIDE-FORMOTEROL FUMARATE 80-4.5 MCG/ACT IN AERO
2.0000 | INHALATION_SPRAY | Freq: Two times a day (BID) | RESPIRATORY_TRACT | 12 refills | Status: AC
  Filled 2024-09-15: qty 10.3, 30d supply, fill #0

## 2024-09-15 NOTE — Progress Notes (Signed)
 Please attest this visit in the absence of patient primary care provider.   Vital Signs: Because this visit was a virtual/telehealth visit, some criteria may be missing or patient reported. Any vitals not documented were not able to be obtained and vitals that have been documented are patient reported.   Chief Complaint  Patient presents with   Medicare Wellness     Subjective:   Beverly Erickson is a 53 y.o. female who presents for a Medicare Annual Wellness Visit.  Allergies (verified) Codeine and Mucinex [guaifenesin er]   History: Past Medical History:  Diagnosis Date   Anemia    Chicken pox    Depression    Depression with anxiety 08/30/2015   GERD (gastroesophageal reflux disease)    Kidney stone 02/13/2014   Right, small   Neck pain, musculoskeletal 11/17/2016   Obesity    Other and unspecified hyperlipidemia 01/14/2014   Otitis media 11/17/2016   Preventative health care 07/05/2014   RSD (reflex sympathetic dystrophy)    Tinea corporis 06/10/2017   Tobacco use disorder 01/14/2014   1ppd   Unspecified constipation 05/25/2014   Urinary frequency 03/06/2017   Past Surgical History:  Procedure Laterality Date   CESAREAN SECTION  2000   LAPAROSCOPIC APPENDECTOMY N/A 05/22/2022   Procedure: APPENDECTOMY LAPAROSCOPIC;  Surgeon: Vernetta Berg, MD;  Location: [facility];  Service: General;  Laterality: N/A;   plate and screws in left arm  2005   humerus   WISDOM TOOTH EXTRACTION  53 yrs old   Family History  Problem Relation Age of Onset   Arthritis Mother        Living   Hypertension Mother    Thrombocytopenia Mother    Hyperlipidemia Father    Hypertension Father    Thrombocytopenia Maternal Grandmother    Stroke Maternal Grandmother    Hypertension Maternal Grandmother    Alcohol abuse Maternal Grandfather    Heart disease Maternal Grandfather    Parkinson's disease Maternal Grandfather    Alcohol abuse Paternal Grandfather    Cancer Paternal Grandfather     Hypothyroidism Daughter    Diabetes Daughter        type 1   Dementia Paternal Grandmother    Social History   Occupational History   Occupation: conservation officer, nature    Comment: Disability, but works at [employer] part time  Tobacco Use   Smoking status: Every Day    Current packs/day: 1.00    Average packs/day: 1 pack/day for 20.0 years (20.0 ttl pk-yrs)    Types: Cigarettes   Smokeless tobacco: Never  Substance and Sexual Activity   Alcohol use: Not Currently    Comment: once a year.   Drug use: No   Sexual activity: Not on file    Comment: lives with son, 27 yo daughter, boyfriend, no dietary   Tobacco Counseling Ready to quit: Not Answered Counseling given: Not Answered  SDOH Screenings   Food Insecurity: No Food Insecurity (09/10/2024)  Housing: Low Risk  (09/10/2024)  Transportation Needs: No Transportation Needs (09/10/2024)  Utilities: At Risk (09/10/2024)  Alcohol Screen: Low Risk  (09/10/2024)  Depression (PHQ2-9): Medium Risk (09/10/2024)  Financial Resource Strain: Medium Risk (07/09/2021)  Physical Activity: Inactive (09/10/2024)  Social Connections: Moderately Isolated (09/10/2024)  Stress: Stress Concern Present (09/10/2024)  Tobacco Use: High Risk (09/10/2024)   See flowsheets for full screening details  Depression Screen PHQ 2 & 9 Depression Scale- Over the past 2 weeks, how often have you been bothered by any of the following  problems? Little interest or pleasure in doing things: 1 Feeling down, depressed, or hopeless (PHQ Adolescent also includes...irritable): 1 (when she worries about not being able to clean house like she used to) PHQ-2 Total Score: 2 Trouble falling or staying asleep, or sleeping too much: 2 (difficulty falling asleep) Feeling tired or having little energy: 1 Poor appetite or overeating (PHQ Adolescent also includes...weight loss): 0 Feeling bad about yourself - or that you are a failure or have let yourself or your family down:  1 Trouble concentrating on things, such as reading the newspaper or watching television (PHQ Adolescent also includes...like school work): 0 Moving or speaking so slowly that other people could have noticed. Or the opposite - being so fidgety or restless that you have been moving around a lot more than usual: 0 Thoughts that you would be better off dead, or of hurting yourself in some way: 0 PHQ-9 Total Score: 6 If you checked off any problems, how difficult have these problems made it for you to do your work, take care of things at home, or get along with other people?: Somewhat difficult (somewhat to very difficult)  Depression Treatment Depression Interventions/Treatment : Medication; Counseling (pt has been referred to therapist but hasn't chosen one yet)     Goals Addressed   None    Visit info / Clinical Intake: Medicare Wellness Visit Type:: Subsequent Annual Wellness Visit Persons participating in visit:: patient Medicare Wellness Visit Mode:: Telephone If telephone:: video declined Because this visit was a virtual/telehealth visit:: pt reported vitals If Telephone or Video please confirm:: I connected with the patient using audio enabled telemedicine application and verified that I am speaking with the correct person using two identifiers; I discussed the limitations of evaluation and management by telemedicine; The patient expressed understanding and agreed to proceed Patient Location:: home Provider Location:: office Information given by:: patient Interpreter Needed?: No Pre-visit prep was completed: yes AWV questionnaire completed by patient prior to visit?: no Living arrangements:: lives with spouse/significant other; with family/others Patient's Overall Health Status Rating: good Typical amount of pain: (!) a lot Does pain affect daily life?: (!) yes Are you currently prescribed opioids?: (!) yes (hydrocodone  and ibuprofen for breakthrough pain)  Dietary Habits and  Nutritional Risks How many meals a day?: 2 (snacks in between  and is my downfall) Eats fruit and vegetables daily?: yes Most meals are obtained by: preparing own meals; eating out In the last 2 weeks, have you had any of the following?: none Diabetic:: no  Functional Status Activities of Daily Living (to include ambulation/medication): Independent Ambulation: Independent Medication Administration: Independent Home Management: Independent Manage your own finances?: yes Primary transportation is: driving Concerns about vision?: no *vision screening is required for [facility]* (past due for eye exam, will send list for doctor's in [facility].) Concerns about hearing?: (!) yes (has to ask others to repeat themselves. Would like referral to audiology) Uses hearing aids?: no  Fall Screening Falls in the past year?: 0 Number of falls in past year: 0 Was there an injury with Fall?: 0 Fall Risk Category Calculator: 0 Patient Fall Risk Level: Low Fall Risk  Fall Risk Patient at Risk for Falls Due to: Orthopedic patient; Impaired mobility Fall risk Follow up: Falls evaluation completed  Home and Transportation Safety: All rugs have non-skid backing?: yes All stairs or steps have railings?: yes Grab bars in the bathtub or shower?: yes (where to get grab bars?) Have non-skid surface in bathtub or shower?: (!) no Good  home lighting?: yes Regular seat belt use?: yes Hospital stays in the last year:: no  Cognitive Assessment Difficulty concentrating, remembering, or making decisions? : yes (works part time at [employer], they tell pump, gas and amount and they ask for something else she forgets first information she was given.) Will 6CIT or Mini Cog be Completed: yes What year is it?: 0 points What month is it?: 0 points Give patient an address phrase to remember (5 components): [address], [place]  About what time is it?: 0 points Count backwards from 20 to 1: 0 points Say the  months of the year in reverse: 0 points Repeat the address phrase from earlier: 2 points 6 CIT Score: 2 points  Advance Directives (For Healthcare) Does Patient Have a Medical Advance Directive?: No Would patient like information on creating a medical advance directive?: Yes (MAU/Ambulatory/Procedural Areas - Information given)  Reviewed/Updated  Reviewed/Updated: Reviewed All (Medical, Surgical, Family, Medications, Allergies, Care Teams, Patient Goals)        Objective:    Today's Vitals   09/10/24 0902  Weight: 244 lb (110.7 kg)  Height: 5' 7 (1.702 m)   Body mass index is 38.22 kg/m.  Current Medications (verified) Outpatient Encounter Medications as of 09/10/2024  Medication Sig   albuterol  (VENTOLIN  HFA) 108 (90 Base) MCG/ACT inhaler Inhale 2 puffs into the lungs every 6 (six) hours as needed for wheezing or shortness of breath.   fluticasone  (FLONASE ) 50 MCG/ACT nasal spray Place 2 sprays into both nostrils daily.   ibuprofen (ADVIL) 200 MG tablet Take 200-400 mg by mouth every 6 (six) hours as needed for mild pain or headache.   nitroGLYCERIN  (NITROSTAT ) 0.4 MG SL tablet Place 1 tablet (0.4 mg total) under the tongue every 5 (five) minutes as needed for chest pain.   omeprazole  (PRILOSEC) 40 MG capsule Take 1 capsule (40 mg total) by mouth 2 (two) times daily.   POTASSIUM CHLORIDE PO Take 1 tablet by mouth at bedtime.   promethazine -dextromethorphan (PROMETHAZINE -DM) 6.25-15 MG/5ML syrup Take 5 mLs by mouth 4 (four) times daily as needed for cough.   [DISCONTINUED] budesonide -formoterol  (SYMBICORT ) 160-4.5 MCG/ACT inhaler Inhale 2 puffs into the lungs 2 (two) times daily.   [DISCONTINUED] citalopram  (CELEXA ) 20 MG tablet Take one-half tablet (10mg ) by mouth once daily for 1 week, then increase to 1 tablet (20mg ) once daily at bedtime.   [DISCONTINUED] HYDROcodone -acetaminophen  (NORCO) 7.5-325 MG tablet Take 1 tablet by mouth 3 (three) times daily as needed for moderate  pain (pain score 4-6).   [DISCONTINUED] ondansetron  (ZOFRAN ) 4 MG tablet Take 1 tablet (4 mg total) by mouth every 8 (eight) hours as needed for nausea or vomiting.   budesonide -formoterol  (SYMBICORT ) 160-4.5 MCG/ACT inhaler Inhale 2 puffs into the lungs 2 (two) times daily.   citalopram  (CELEXA ) 20 MG tablet Take 0.5 tablets (10 mg total) by mouth daily for 7 days, THEN 1 tablet (20 mg total) at bedtime.   meclizine  (ANTIVERT ) 25 MG tablet Take 1 tablet (25 mg total) by mouth 3 (three) times daily as needed for dizziness. (Patient not taking: Reported on 09/10/2024)   pramipexole  (MIRAPEX ) 1 MG tablet Take 1 tablet (1 mg total) by mouth at bedtime. (Patient not taking: Reported on 09/10/2024)   No facility-administered encounter medications on file as of 09/10/2024.   Hearing/Vision screen No results found. Immunizations and Health Maintenance Health Maintenance  Topic Date Due   Pneumococcal Vaccine: 50+ Years (1 of 2 - PCV) Never done   Hepatitis B  Vaccines 19-59 Average Risk (1 of 3 - 19+ 3-dose series) Never done   Zoster Vaccines- Shingrix (1 of 2) Never done   Cervical Cancer Screening (HPV/Pap Cotest)  01/17/2017   Fecal DNA (Cologuard)  Never done   Lung Cancer Screening  Never done   Influenza Vaccine  Never done   Medicare Annual Wellness (AWV)  09/10/2025   DTaP/Tdap/Td (3 - Td or Tdap) 02/17/2026   Mammogram  08/18/2026   HIV Screening  Addressed   HPV VACCINES  Aged Out   Meningococcal B Vaccine  Aged Out   COVID-19 Vaccine  Discontinued   Hepatitis C Screening  Discontinued        Assessment/Plan:  This is a routine wellness examination for Beverly Erickson.  Patient Care Team: Domenica Harlene LABOR, MD as PCP - General (Family Medicine)  I have personally reviewed and noted the following in the patient's chart:   Medical and social history Use of alcohol, tobacco or illicit drugs  Current medications and supplements including opioid prescriptions. Functional ability and  status Nutritional status Physical activity Advanced directives List of other physicians Hospitalizations, surgeries, and ER visits in previous 12 months Vitals Screenings to include cognitive, depression, and falls Referrals and appointments  Orders Placed This Encounter  Procedures   Cologuard    Standing Status:   Future    Expected Date:   09/10/2024    Expiration Date:   09/10/2025   Ambulatory Referral Lung Cancer Screening [facility]    Referral Priority:   Routine    Referral Type:   Consultation    Referral Reason:   Specialty Services Required    Number of Visits Requested:   1   Ambulatory referral to Obstetrics / Gynecology    Referral Priority:   Routine    Referral Type:   Consultation    Referral Reason:   Specialty Services Required    Requested Specialty:   Obstetrics and Gynecology    Number of Visits Requested:   1   Ambulatory referral to Audiology    Referral Priority:   Routine    Referral Type:   Audiology Exam    Referral Reason:   Specialty Services Required    Requested Specialty:   Audiology    Number of Visits Requested:   1   In addition, I have reviewed and discussed with patient certain preventive protocols, quality metrics, and best practice recommendations. A written personalized care plan for preventive services as well as general preventive health recommendations were provided to patient.   Lolita Libra, CMA   09/15/2024   Return in 1 year (on 09/10/2025).  After Visit Summary: (MyChart) Due to this being a telephonic visit, the after visit summary with patients personalized plan was offered to patient via MyChart   Nurse Notes: see phone note

## 2024-09-15 NOTE — Telephone Encounter (Signed)
 Please advise

## 2024-09-15 NOTE — Telephone Encounter (Signed)
 Can the order for Budesonide -formoterol  160-4.75mcg be changed to Breyna ?  Looks like the insurance will cover the Breyna  160-4.16mcg for a $47 copay.

## 2024-09-16 ENCOUNTER — Other Ambulatory Visit (HOSPITAL_BASED_OUTPATIENT_CLINIC_OR_DEPARTMENT_OTHER): Payer: Self-pay

## 2024-09-26 ENCOUNTER — Other Ambulatory Visit (HOSPITAL_BASED_OUTPATIENT_CLINIC_OR_DEPARTMENT_OTHER): Payer: Self-pay

## 2024-10-10 ENCOUNTER — Other Ambulatory Visit (HOSPITAL_BASED_OUTPATIENT_CLINIC_OR_DEPARTMENT_OTHER): Payer: Self-pay

## 2024-10-10 ENCOUNTER — Other Ambulatory Visit: Payer: Self-pay | Admitting: Family Medicine

## 2024-10-10 DIAGNOSIS — M549 Dorsalgia, unspecified: Secondary | ICD-10-CM

## 2024-10-10 DIAGNOSIS — G9050 Complex regional pain syndrome I, unspecified: Secondary | ICD-10-CM

## 2024-10-10 NOTE — Telephone Encounter (Signed)
 Requesting: Norco 7.5-325 Contract: 12/20/2023 UDS: N/A Last Visit: 07/07/2024 Next Visit: 01/26/2025 Last Refill: 09/10/2024  Please Advise

## 2024-10-11 MED ORDER — HYDROCODONE-ACETAMINOPHEN 7.5-325 MG PO TABS
1.0000 | ORAL_TABLET | Freq: Three times a day (TID) | ORAL | 0 refills | Status: DC | PRN
  Filled 2024-10-11: qty 90, 30d supply, fill #0

## 2024-10-13 ENCOUNTER — Other Ambulatory Visit (HOSPITAL_BASED_OUTPATIENT_CLINIC_OR_DEPARTMENT_OTHER): Payer: Self-pay

## 2024-11-12 ENCOUNTER — Other Ambulatory Visit: Payer: Self-pay | Admitting: Family

## 2024-11-12 DIAGNOSIS — G9050 Complex regional pain syndrome I, unspecified: Secondary | ICD-10-CM

## 2024-11-12 DIAGNOSIS — M549 Dorsalgia, unspecified: Secondary | ICD-10-CM

## 2024-11-13 ENCOUNTER — Other Ambulatory Visit (HOSPITAL_BASED_OUTPATIENT_CLINIC_OR_DEPARTMENT_OTHER): Payer: Self-pay

## 2024-11-13 ENCOUNTER — Other Ambulatory Visit: Payer: Self-pay

## 2024-11-13 ENCOUNTER — Other Ambulatory Visit: Payer: Self-pay | Admitting: Family Medicine

## 2024-11-13 DIAGNOSIS — M549 Dorsalgia, unspecified: Secondary | ICD-10-CM

## 2024-11-13 DIAGNOSIS — G9050 Complex regional pain syndrome I, unspecified: Secondary | ICD-10-CM

## 2024-11-13 NOTE — Telephone Encounter (Signed)
 Requesting: HYDROcodone -acetaminophen  (NORCO) 7.5-325 MG  Contract: 12/20/2023 UDS: 12/19/2021 Last Visit: 07/07/2024 Next Visit: 01/28/2025 Last Refill: 10/11/2024  Please Advise

## 2024-11-13 NOTE — Telephone Encounter (Signed)
 Requesting: hydrocodone   Contract: 01/10/24 UDS:  12/19/21 Last Visit: 07/07/24 Next Visit: None Last Refill: 10/11/24 #90 and 0RF   Please Advise

## 2024-11-14 ENCOUNTER — Other Ambulatory Visit (HOSPITAL_BASED_OUTPATIENT_CLINIC_OR_DEPARTMENT_OTHER): Payer: Self-pay

## 2024-11-14 ENCOUNTER — Telehealth: Payer: Self-pay | Admitting: Family Medicine

## 2024-11-14 MED ORDER — HYDROCODONE-ACETAMINOPHEN 7.5-325 MG PO TABS
1.0000 | ORAL_TABLET | Freq: Three times a day (TID) | ORAL | 0 refills | Status: AC | PRN
  Filled 2024-11-14: qty 90, 30d supply, fill #0

## 2024-11-14 NOTE — Telephone Encounter (Signed)
 Copied from CRM #[id number]. Topic: General - Other >> Nov 13, 2024  3:56 PM Alexandria E wrote: Reason for CRM: Patient requesting phone call from PCP, did not say exactly what it was in regards too, but would like to speak with her before she stops seeing patients come April.

## 2024-11-14 NOTE — Telephone Encounter (Signed)
 Returned pt's call and no answer left message to return call.

## 2024-11-18 NOTE — Telephone Encounter (Signed)
 Called pt and no answer and mailbox was full could not leave a message.

## 2024-11-21 NOTE — Telephone Encounter (Signed)
 Spoke to pt and she wanted to see which provider was a great fit for her to see. Pt was advised she has an appointment scheduled already with Harlene Wheeler [facility] and see her as new PCP.

## 2025-01-26 ENCOUNTER — Encounter: Payer: Medicare (Managed Care) | Admitting: Family Medicine

## 2025-01-28 ENCOUNTER — Encounter: Payer: Medicare (Managed Care) | Admitting: Student

## 2025-08-14 ENCOUNTER — Ambulatory Visit: Payer: Medicare (Managed Care)

## 2025-09-15 ENCOUNTER — Ambulatory Visit: Payer: Medicare (Managed Care)
